# Patient Record
Sex: Female | Born: 1970 | Race: Black or African American | Hispanic: No | Marital: Married | Smoking: Never smoker
Health system: Southern US, Community
[De-identification: ages and names within clinical notes are randomized; demographics above are authoritative.]

## PROBLEM LIST (undated history)

## (undated) DIAGNOSIS — Z87442 Personal history of urinary calculi: Secondary | ICD-10-CM

## (undated) DIAGNOSIS — M51369 Other intervertebral disc degeneration, lumbar region without mention of lumbar back pain or lower extremity pain: Secondary | ICD-10-CM

## (undated) DIAGNOSIS — K589 Irritable bowel syndrome without diarrhea: Secondary | ICD-10-CM

## (undated) DIAGNOSIS — R51 Headache: Secondary | ICD-10-CM

## (undated) DIAGNOSIS — G8929 Other chronic pain: Secondary | ICD-10-CM

## (undated) DIAGNOSIS — R109 Unspecified abdominal pain: Secondary | ICD-10-CM

## (undated) DIAGNOSIS — K529 Noninfective gastroenteritis and colitis, unspecified: Secondary | ICD-10-CM

## (undated) DIAGNOSIS — J45909 Unspecified asthma, uncomplicated: Secondary | ICD-10-CM

## (undated) DIAGNOSIS — M5430 Sciatica, unspecified side: Secondary | ICD-10-CM

## (undated) DIAGNOSIS — M549 Dorsalgia, unspecified: Secondary | ICD-10-CM

## (undated) DIAGNOSIS — M5126 Other intervertebral disc displacement, lumbar region: Secondary | ICD-10-CM

## (undated) DIAGNOSIS — M5136 Other intervertebral disc degeneration, lumbar region: Secondary | ICD-10-CM

## (undated) DIAGNOSIS — M25569 Pain in unspecified knee: Secondary | ICD-10-CM

## (undated) DIAGNOSIS — I10 Essential (primary) hypertension: Secondary | ICD-10-CM

## (undated) DIAGNOSIS — R519 Headache, unspecified: Secondary | ICD-10-CM

## (undated) DIAGNOSIS — R110 Nausea: Secondary | ICD-10-CM

## (undated) DIAGNOSIS — K5790 Diverticulosis of intestine, part unspecified, without perforation or abscess without bleeding: Secondary | ICD-10-CM

---

## 2001-09-06 ENCOUNTER — Ambulatory Visit (HOSPITAL_COMMUNITY): Admission: AD | Admit: 2001-09-06 | Discharge: 2001-09-06 | Payer: Self-pay | Admitting: Obstetrics and Gynecology

## 2001-09-07 ENCOUNTER — Ambulatory Visit (HOSPITAL_COMMUNITY): Admission: RE | Admit: 2001-09-07 | Discharge: 2001-09-07 | Payer: Self-pay | Admitting: Obstetrics and Gynecology

## 2001-09-07 ENCOUNTER — Encounter: Payer: Self-pay | Admitting: Obstetrics and Gynecology

## 2002-11-16 HISTORY — PX: CHOLECYSTECTOMY: SHX55

## 2003-02-01 ENCOUNTER — Encounter: Payer: Self-pay | Admitting: Emergency Medicine

## 2003-02-01 ENCOUNTER — Emergency Department (HOSPITAL_COMMUNITY): Admission: EM | Admit: 2003-02-01 | Discharge: 2003-02-01 | Payer: Self-pay | Admitting: Emergency Medicine

## 2005-06-26 ENCOUNTER — Ambulatory Visit (HOSPITAL_COMMUNITY): Admission: RE | Admit: 2005-06-26 | Discharge: 2005-06-26 | Payer: Self-pay | Admitting: Emergency Medicine

## 2005-06-26 ENCOUNTER — Emergency Department (HOSPITAL_COMMUNITY): Admission: EM | Admit: 2005-06-26 | Discharge: 2005-06-26 | Payer: Self-pay | Admitting: Emergency Medicine

## 2005-07-02 ENCOUNTER — Observation Stay (HOSPITAL_COMMUNITY): Admission: EM | Admit: 2005-07-02 | Discharge: 2005-07-05 | Payer: Self-pay | Admitting: *Deleted

## 2005-07-03 ENCOUNTER — Encounter (INDEPENDENT_AMBULATORY_CARE_PROVIDER_SITE_OTHER): Payer: Self-pay | Admitting: General Surgery

## 2010-12-22 ENCOUNTER — Other Ambulatory Visit: Payer: Self-pay | Admitting: Obstetrics & Gynecology

## 2010-12-22 ENCOUNTER — Other Ambulatory Visit (HOSPITAL_COMMUNITY)
Admission: RE | Admit: 2010-12-22 | Discharge: 2010-12-22 | Disposition: A | Discharge status: Home / Self Care | Payer: BC Managed Care – PPO | Source: Ambulatory Visit | Attending: Obstetrics & Gynecology | Admitting: Obstetrics & Gynecology

## 2010-12-22 DIAGNOSIS — Z01419 Encounter for gynecological examination (general) (routine) without abnormal findings: Secondary | ICD-10-CM | POA: Insufficient documentation

## 2011-04-03 NOTE — H&P (Signed)
Rebecca Odom, BRITZ NO.:  [id number]   MEDICAL RECORD NO.:  [id number]          PATIENT TYPE:  INP   LOCATION:  A320                          FACILITY:  [facility]   PHYSICIAN:  Dalia Heading, M.D.  DATE OF BIRTH:  05-02-1971   DATE OF ADMISSION:  DATE OF DISCHARGE:  LH                                HISTORY & PHYSICAL   CHIEF COMPLAINT:  Cholecystitis, cholelithiasis.   HISTORY OF PRESENT ILLNESS:  The patient is a 40 year old white female who  was referred for evaluation and treatment of biliary colic secondary to  cholelithiasis.  She has been having right upper quadrant abdominal pain  with radiation to the right flank, nausea and bloating for several weeks.  She does have fatty food intolerance.  No fever, chills or jaundice had been  noted.   PAST MEDICAL HISTORY:  Includes extrinsic allergies.   PAST SURGICAL HISTORY:  Unremarkable.   CURRENT MEDICATIONS:  1.  Vicodin for pain.  2.  Nasal spray.   ALLERGIES:  No known drug allergies.   REVIEW OF SYSTEMS:  Noncontributory.   PHYSICAL EXAMINATION:  GENERAL APPEARANCE:  The patient is a well-developed,  well-nourished black female in no acute distress.  VITAL SIGNS:  She is afebrile, and the vital signs are stable.  HEENT:  Reveals no scleral icterus.  LUNGS:  Clear to auscultation with equal breath sounds bilaterally.  HEART:  Reveals a regular rate and rhythm without S3, S4 or murmurs.  ABDOMEN:  Soft and nondistended.  She is tender in the right upper quadrant  to palpation.  No hepatosplenomegaly, masses or hernias are identified.   An ultrasound of the gallbladder reveals cholelithiasis with a normal common  bile duct.   IMPRESSION:  1.  Cholecystitis.  2.  Cholelithiasis.   PLAN:  The patient is scheduled for a laparoscopic cholecystectomy on July 06, 2005.  The risks and benefits of the procedure including bleeding,  infection, hepatobiliary injury and the possibility of an open  procedure  were fully explained to the patient, who gave informed consent.      Dalia Heading, M.D.  Electronically Signed     MAJ/MEDQ  D:  06/30/2005  T:  06/30/2005  Job:  [id number]   cc:   Dalia Heading, M.D.  [address], [place]  [place] [address]  Fax: [phone]

## 2011-04-03 NOTE — Op Note (Signed)
Rebecca Odom, Rebecca Odom NO.:  [id number]   MEDICAL RECORD NO.:  [id number]          PATIENT TYPE:  OBV   LOCATION:  A320                          FACILITY:  [facility]   PHYSICIAN:  Dalia Heading, M.D.  DATE OF BIRTH:  1971/01/18   DATE OF PROCEDURE:  07/03/2005  DATE OF DISCHARGE:                                 OPERATIVE REPORT   PREOPERATIVE DIAGNOSIS:  Cholecystitis, cholelithiasis.   POSTOPERATIVE DIAGNOSIS:  Cholecystitis, cholelithiasis.   PROCEDURE:  Laparoscopic cholecystectomy.   SURGEON:  Dr. Franky Macho.   ASSISTANT:  Dr. Arna Snipe.   ANESTHESIA:  General endotracheal.   INDICATIONS:  The patient is a 40 year old black female who presents with  cholecystitis secondary to cholelithiasis. Risks and benefits of procedure  including bleeding, infection, hepatobiliary injury, and the possibility of  an open procedure were fully explained to the patient, who gave informed  consent.   PROCEDURE NOTE:  The patient was placed in supine position. After induction  of general endotracheal anesthesia, the abdomen was prepped and draped using  the usual sterile technique with Betadine. Surgical site confirmation was  performed.   A supraumbilical incision was made down to the fascia. Veress needle was  introduced into the abdominal cavity and confirmation of placement was done  using the saline drop test. The abdomen was then insufflated to 16 mmHg  pressure. An  11-mm trocar was introduced into the abdominal cavity under  direct visualization without difficulty. The patient was placed in reverse  Trendelenburg position. An additional 11-mm trocar was placed in the  epigastric region, and 5-mm trocars were placed in the right upper quadrant  and right flank regions. Liver was inspected and noted to normal limits.  Gallbladder was attracted superiorly and laterally. Dissection was begun  around the infundibulum of the gallbladder. The cystic ducts first  identified. Its juncture to the infundibulum fully identified. Endoclips  were placed proximally and distally on the cystic duct and cystic duct was  divided. This was likewise done to the cystic artery. The gallbladder then  freed away from the gallbladder fossa using Bovie electrocautery. The  gallbladder was delivered through the epigastric trocar site using an  EndoCatch bag. Gallbladder fossa was inspected. No abnormal bleeding or bile  leakage was noted. Surgicel was placed in the gallbladder fossa. All fluid  and air were then evacuated from the abdominal cavity prior to removal the  trocars.   All wounds were irrigated with normal saline. All wounds were injected with  0.5% Sensorcaine. The supraumbilical fascia was inspected, and no  significant fascial defect was noted. All the wounds were closed with  staples. Betadine ointment and dry sterile dressings were applied.   All tape and needle counts were correct at the end of the procedure. The  patient was extubated in the operating room and went back to recovery room  awake in stable condition.   COMPLICATIONS:  None.   SPECIMEN:  Gallbladder with stone.   BLOOD LOSS:  Minimal.      Dalia Heading, M.D.  Electronically Signed  MAJ/MEDQ  D:  07/03/2005  T:  07/03/2005  Job:  [id number]   cc:   [facility]

## 2011-04-03 NOTE — Discharge Summary (Signed)
NAMEBALINDA, Rebecca Odom NO.:  [id number]   MEDICAL RECORD NO.:  [id number]          PATIENT TYPE:  OBV   LOCATION:  A320                          FACILITY:  [facility]   PHYSICIAN:  Dalia Heading, M.D.  DATE OF BIRTH:  11/02/1971   DATE OF ADMISSION:  07/02/2005  DATE OF DISCHARGE:  08/20/2006LH                                 DISCHARGE SUMMARY   HOSPITAL COURSE SUMMARY:  Patient is a 40 year old black female with a known  history of cholecystitis and cholelithiasis who presented to the emergency  room on July 02, 2005 with worsening right upper quadrant abdominal pain,  nausea, vomiting. She was noted to have an elevated white blood cell count  at 13.4. She was admitted to the hospital for control of her nausea and  vomiting as well as to start antibiotic therapy. She subsequently underwent  laparoscopic cholecystectomy on July 04, 2005. She tolerated the procedure  well. Postoperative course was remarkable for a fever on the first  postoperative day. Her white blood cell count was noted to be 17.1 with  elevated neutrophil count. Her hematocrit was stable. She was continued on  Zosyn. Her diet was advanced slowly without difficulty.   Patient's fever did resolve. The following day, her white blood cell count  was noted to be stable at around 18. She does feel better and requests to be  discharged.   She will be discharged on Levaquin. Of note was that her liver enzyme tests  were within normal limits on postoperative day #2. The patient is being  discharged home on postoperative day #2 in good condition.   DISCHARGE INSTRUCTIONS:  The patient is to follow up with Dr. Franky Macho  on July 09, 2005. Discharge medications include Vicodin one to two tablets  p.o. q.4 h. p.r.n. pain and Levaquin 500 mg p.o. daily x1 week.   PRINCIPAL DIAGNOSES:  1.  Cholecystitis, cholelithiasis.  2.  History of sinusitis.   PRINCIPAL PROCEDURE:  Laparoscopic cholecystectomy  on July 04, 2005.      Dalia Heading, M.D.  Electronically Signed     MAJ/MEDQ  D:  07/05/2005  T:  07/05/2005  Job:  [id number]   cc:   [facility]

## 2011-04-17 HISTORY — PX: ENDOMETRIAL ABLATION: SHX621

## 2011-05-06 ENCOUNTER — Encounter (HOSPITAL_COMMUNITY)
Admission: RE | Admit: 2011-05-06 | Discharge: 2011-05-06 | Disposition: A | Discharge status: Home / Self Care | Payer: BC Managed Care – PPO | Source: Ambulatory Visit | Attending: Obstetrics & Gynecology | Admitting: Obstetrics & Gynecology

## 2011-05-06 LAB — COMPREHENSIVE METABOLIC PANEL
ALT: 14 U/L (ref 0–35)
AST: 15 U/L (ref 0–37)
Albumin: 3.7 g/dL (ref 3.5–5.2)
Alkaline Phosphatase: 88 U/L (ref 39–117)
BUN: 13 mg/dL (ref 6–23)
Chloride: 104 mEq/L (ref 96–112)
Potassium: 4.2 mEq/L (ref 3.5–5.1)
Sodium: 139 mEq/L (ref 135–145)
Total Bilirubin: 0.4 mg/dL (ref 0.3–1.2)
Total Protein: 7.4 g/dL (ref 6.0–8.3)

## 2011-05-06 LAB — URINALYSIS, ROUTINE W REFLEX MICROSCOPIC
Bilirubin Urine: NEGATIVE
Ketones, ur: NEGATIVE mg/dL
Specific Gravity, Urine: 1.025 (ref 1.005–1.030)
pH: 6 (ref 5.0–8.0)

## 2011-05-06 LAB — URINE MICROSCOPIC-ADD ON

## 2011-05-06 LAB — HCG, QUANTITATIVE, PREGNANCY: hCG, Beta Chain, Quant, S: 1 m[IU]/mL (ref <5)

## 2011-05-06 LAB — CBC
HCT: 34.5 % — ABNORMAL LOW (ref 36.0–46.0)
Hemoglobin: 11.1 g/dL — ABNORMAL LOW (ref 12.0–15.0)
MCH: 25.9 pg — ABNORMAL LOW (ref 26.0–34.0)
MCHC: 32.2 g/dL (ref 30.0–36.0)
MCV: 80.6 fL (ref 78.0–100.0)
Platelets: 399 10*3/uL (ref 150–400)
RBC: 4.28 MIL/uL (ref 3.87–5.11)
RDW: 14.6 % (ref 11.5–15.5)
WBC: 11.9 10*3/uL — ABNORMAL HIGH (ref 4.0–10.5)

## 2011-05-13 ENCOUNTER — Ambulatory Visit (HOSPITAL_COMMUNITY)
Admission: RE | Admit: 2011-05-13 | Discharge: 2011-05-13 | Disposition: A | Discharge status: Home / Self Care | Payer: BC Managed Care – PPO | Source: Ambulatory Visit | Attending: Obstetrics & Gynecology | Admitting: Obstetrics & Gynecology

## 2011-05-13 ENCOUNTER — Other Ambulatory Visit: Payer: Self-pay | Admitting: Obstetrics & Gynecology

## 2011-05-13 DIAGNOSIS — Z01812 Encounter for preprocedural laboratory examination: Secondary | ICD-10-CM | POA: Insufficient documentation

## 2011-05-13 DIAGNOSIS — N920 Excessive and frequent menstruation with regular cycle: Secondary | ICD-10-CM | POA: Insufficient documentation

## 2011-05-13 DIAGNOSIS — N946 Dysmenorrhea, unspecified: Secondary | ICD-10-CM | POA: Insufficient documentation

## 2011-05-19 NOTE — Op Note (Signed)
  NAMESHACARRA, CHOE NO.:  [id number]  MEDICAL RECORD NO.:  [id number]  LOCATION:  DAYP                          FACILITY:  [facility]  PHYSICIAN:  Lazaro Arms, M.D.   DATE OF BIRTH:  1970-12-20  DATE OF PROCEDURE:  05/13/2011 DATE OF DISCHARGE:                              OPERATIVE REPORT   PREOPERATIVE DIAGNOSES: 1. Menometrorrhagia. 2. Dysmenorrhea.  POSTOPERATIVE DIAGNOSES: 1. Menometrorrhagia. 2. Dysmenorrhea.  PROCEDURE:  Hysteroscopy, dilatation and curettage, and endometrial ablation.  SURGEON:  Lazaro Arms, MD  ANESTHESIA:  General endotracheal.  FINDINGS:  The patient had normal endometrium.  No polyps, fibroids, or other abnormalities.  DESCRIPTION OF OPERATION:  The patient was taken to the operating room and placed in a supine position where she underwent general endotracheal anesthesia, placed in the dorsal lithotomy position, prepped and draped in the usual sterile fashion.  Graves speculum was placed.  The cervix was grasped with a single-tooth tenaculum and dilated serially to allow passage of the hysteroscope.  A diagnostic hysteroscopy was performed and found to be normal.  A vigorous uterine curettage was performed and good uterine cry was obtained in all areas.  A ThermaChoice III endometrial ablation balloon was used, 21 mL of D5W was required to maintain a pressure between 190 and 200 mmHg throughout the procedure. Total therapy time was 9 minutes and 49 seconds.  All of the equipment worked properly throughout the procedure.  All the fluid was returned at the end of the procedure.  The patient tolerated the procedure well. She was awakened from anesthesia and taken to recovery room in good stable condition.  She received Ancef and Toradol preoperatively prophylactically.    Lazaro Arms, M.D.    Loraine Maple  D:  05/13/2011  T:  05/14/2011  Job:  [id number]  Electronically Signed by Duane Lope M.D. on 05/19/2011  10:30:12 AM

## 2011-12-06 ENCOUNTER — Encounter (HOSPITAL_COMMUNITY): Payer: Self-pay | Admitting: *Deleted

## 2011-12-06 ENCOUNTER — Emergency Department (HOSPITAL_COMMUNITY)
Admission: EM | Admit: 2011-12-06 | Discharge: 2011-12-06 | Disposition: A | Discharge status: Home / Self Care | Payer: BC Managed Care – PPO | Attending: Emergency Medicine | Admitting: Emergency Medicine

## 2011-12-06 ENCOUNTER — Emergency Department (HOSPITAL_COMMUNITY): Payer: BC Managed Care – PPO

## 2011-12-06 DIAGNOSIS — Z9851 Tubal ligation status: Secondary | ICD-10-CM | POA: Insufficient documentation

## 2011-12-06 DIAGNOSIS — N201 Calculus of ureter: Secondary | ICD-10-CM | POA: Insufficient documentation

## 2011-12-06 LAB — URINALYSIS, ROUTINE W REFLEX MICROSCOPIC
Glucose, UA: NEGATIVE mg/dL
Leukocytes, UA: NEGATIVE
Protein, ur: NEGATIVE mg/dL
Specific Gravity, Urine: 1.025 (ref 1.005–1.030)

## 2011-12-06 LAB — URINE MICROSCOPIC-ADD ON

## 2011-12-06 LAB — CBC
Hemoglobin: 12 g/dL (ref 12.0–15.0)
MCH: 27 pg (ref 26.0–34.0)
MCHC: 32.5 g/dL (ref 30.0–36.0)
MCV: 82.9 fL (ref 78.0–100.0)
Platelets: 315 10*3/uL (ref 150–400)
RBC: 4.45 MIL/uL (ref 3.87–5.11)

## 2011-12-06 LAB — DIFFERENTIAL
Eosinophils Absolute: 0.4 10*3/uL (ref 0.0–0.7)
Eosinophils Relative: 3 % (ref 0–5)
Lymphs Abs: 5.4 10*3/uL — ABNORMAL HIGH (ref 0.7–4.0)
Monocytes Relative: 6 % (ref 3–12)

## 2011-12-06 LAB — COMPREHENSIVE METABOLIC PANEL
BUN: 12 mg/dL (ref 6–23)
Calcium: 9.7 mg/dL (ref 8.4–10.5)
GFR calc Af Amer: >90 mL/min (ref >90)
Glucose, Bld: 81 mg/dL (ref 70–99)
Total Protein: 8 g/dL (ref 6.0–8.3)

## 2011-12-06 LAB — PREGNANCY, URINE: Preg Test, Ur: NEGATIVE

## 2011-12-06 MED ORDER — TAMSULOSIN HCL 0.4 MG PO CAPS: 0.4000 mg | ORAL_CAPSULE | Freq: Every day | ORAL | Status: DC

## 2011-12-06 MED ORDER — SODIUM CHLORIDE 0.9 % IV BOLUS (SEPSIS)
1000.0000 mL | Freq: Once | INTRAVENOUS | Status: AC
  Administered 2011-12-06: 1000 mL via INTRAVENOUS

## 2011-12-06 MED ORDER — MORPHINE SULFATE 4 MG/ML IJ SOLN
4.0000 mg | Freq: Once | INTRAMUSCULAR | Status: AC
  Administered 2011-12-06: 4 mg via INTRAVENOUS
  Filled 2011-12-06: qty 1

## 2011-12-06 MED ORDER — KETOROLAC TROMETHAMINE 30 MG/ML IJ SOLN
30.0000 mg | Freq: Once | INTRAMUSCULAR | Status: AC
  Administered 2011-12-06: 30 mg via INTRAVENOUS
  Filled 2011-12-06: qty 1

## 2011-12-06 MED ORDER — SODIUM CHLORIDE 0.9 % IV SOLN
INTRAVENOUS | Status: DC
  Administered 2011-12-06: 16:00:00 via INTRAVENOUS

## 2011-12-06 MED ORDER — PROMETHAZINE HCL 25 MG RE SUPP: 25.0000 mg | Freq: Four times a day (QID) | RECTAL | Status: DC | PRN

## 2011-12-06 MED ORDER — OXYCODONE-ACETAMINOPHEN 5-325 MG PO TABS: ORAL_TABLET | ORAL | Status: DC

## 2011-12-06 MED ORDER — ONDANSETRON HCL 4 MG/2ML IJ SOLN
4.0000 mg | Freq: Once | INTRAMUSCULAR | Status: AC
  Administered 2011-12-06: 4 mg via INTRAVENOUS
  Filled 2011-12-06: qty 2

## 2011-12-06 MED ORDER — KETOROLAC TROMETHAMINE 10 MG PO TABS: ORAL_TABLET | ORAL | Status: DC

## 2011-12-06 NOTE — ED Provider Notes (Signed)
Scribed for Ward Givens, MD, the patient was seen in room APA12/APA12 . This chart was scribed by Ellie Lunch.   CSN: [id number]  Arrival date & time 12/06/11  1109   First MD Initiated Contact with Patient 12/06/11 1210      Chief Complaint  Patient presents with  . Flank Pain    (Consider location/radiation/quality/duration/timing/severity/associated sxs/prior treatment) HPI Rebecca Odom is a 41 y.o. female who presents to the Emergency Department complaining of 1 day of left flank pain. Pt treated pain yesterday with antacid and aleve with mild improvement. Pain began to worsen last night and became severe at 9:30 am this morning. Change in positions worsens pain. Pt reports some associated nausea, diarrhea x 5 yesterday, and  Urinary urgency. Denies vomiting, fever hematuria, and frequency or dysuria. Pt had "stomach flu" 3 weeks ago with n/v/d, fever, chills  and reports having a similar pain. Husband had same about 3 weeks ago.  PCP [facility] Dr. Dixie Dials  History reviewed. No pertinent past medical history.  Past Surgical History  Procedure Date  . Tubal ligation     History reviewed. No pertinent family history.  History  Substance Use Topics  . Smoking status: Never Smoker   . Smokeless tobacco: Not on file  . Alcohol Use: Yes  Pt works in Development worker, community in healthcare setting. Lives with spouse  Review of Systems  Gastrointestinal: Positive for nausea and diarrhea. Negative for vomiting.  Genitourinary: Positive for urgency and flank pain. Negative for dysuria, frequency, hematuria and difficulty urinating.  All other systems reviewed and are negative.    Allergies  Vicodin  Home Medications  No current outpatient prescriptions on file.  BP 131/53  Pulse 95  Temp(Src) 97.9 F (36.6 C) (Oral)  Resp 18  Ht 5' 3.5" (1.613 m)  Wt 240 lb (108.863 kg)  BMI 41.85 kg/m2  SpO2 100%  Vital signs normal    Physical Exam  Nursing note and  vitals reviewed. Constitutional: She is oriented to person, place, and time. She appears well-developed and well-nourished.  Non-toxic appearance. She does not appear ill. No distress.  HENT:  Head: Normocephalic and atraumatic.  Right Ear: External ear normal.  Left Ear: External ear normal.  Nose: Nose normal. No mucosal edema or rhinorrhea.  Mouth/Throat: Oropharynx is clear and moist and mucous membranes are normal. No dental abscesses or uvula swelling.  Eyes: Conjunctivae and EOM are normal. Pupils are equal, round, and reactive to light.  Neck: Normal range of motion and full passive range of motion without pain. Neck supple.  Cardiovascular: Normal rate, regular rhythm and normal heart sounds.  Exam reveals no gallop and no friction rub.   No murmur heard. Pulmonary/Chest: Effort normal and breath sounds normal. No respiratory distress. She has no wheezes. She has no rhonchi. She has no rales. She exhibits no tenderness and no crepitus.  Abdominal: Soft. Normal appearance and bowel sounds are normal. She exhibits no distension. There is tenderness. There is no rebound and no guarding.       Patient is tender to palpation in her left lower abdomen without guarding or rebound.  Genitourinary:       Patient has marked pain in her left flank to percussion, no pain on her right  Musculoskeletal: Normal range of motion. She exhibits no edema and no tenderness.       Moves all extremities well.   Neurological: She is alert and oriented to person, place, and time. She  has normal strength. No cranial nerve deficit.  Skin: Skin is warm, dry and intact. No rash noted. No erythema. No pallor.  Psychiatric: She has a normal mood and affect. Her speech is normal and behavior is normal. Her mood appears not anxious.    ED Course  Procedures (including critical care time) DIAGNOSTIC STUDIES: Oxygen Saturation is 100% on room air, normal by my interpretation.    COORDINATION OF CARE:  Results  for orders placed during the hospital encounter of 12/06/11  URINALYSIS, ROUTINE W REFLEX MICROSCOPIC      Component Value Range   Color, Urine YELLOW  YELLOW    APPearance CLOUDY (*) CLEAR    Specific Gravity, Urine 1.025  1.005 - 1.030    pH 5.5  5.0 - 8.0    Glucose, UA NEGATIVE  NEGATIVE (mg/dL)   Hgb urine dipstick LARGE (*) NEGATIVE    Bilirubin Urine NEGATIVE  NEGATIVE    Ketones, ur NEGATIVE  NEGATIVE (mg/dL)   Protein, ur NEGATIVE  NEGATIVE (mg/dL)   Urobilinogen, UA 0.2  0.0 - 1.0 (mg/dL)   Nitrite NEGATIVE  NEGATIVE    Leukocytes, UA NEGATIVE  NEGATIVE   PREGNANCY, URINE      Component Value Range   Preg Test, Ur NEGATIVE    URINE MICROSCOPIC-ADD ON      Component Value Range   Squamous Epithelial / LPF FEW (*) RARE    WBC, UA 0-2  <3 (WBC/hpf)   RBC / HPF 21-50  <3 (RBC/hpf)   Bacteria, UA FEW (*) RARE    Urine-Other MANY YEAST    CBC      Component Value Range   WBC 14.1 (*) 4.0 - 10.5 (K/uL)   RBC 4.45  3.87 - 5.11 (MIL/uL)   Hemoglobin 12.0  12.0 - 15.0 (g/dL)   HCT 16.1  09.6 - 04.5 (%)   MCV 82.9  78.0 - 100.0 (fL)   MCH 27.0  26.0 - 34.0 (pg)   MCHC 32.5  30.0 - 36.0 (g/dL)   RDW 40.9  81.1 - 91.4 (%)   Platelets 315  150 - 400 (K/uL)  DIFFERENTIAL      Component Value Range   Neutrophils Relative 53  43 - 77 (%)   Neutro Abs 7.4  1.7 - 7.7 (K/uL)   Lymphocytes Relative 39  12 - 46 (%)   Lymphs Abs 5.4 (*) 0.7 - 4.0 (K/uL)   Monocytes Relative 6  3 - 12 (%)   Monocytes Absolute 0.9  0.1 - 1.0 (K/uL)   Eosinophils Relative 3  0 - 5 (%)   Eosinophils Absolute 0.4  0.0 - 0.7 (K/uL)   Basophils Relative 0  0 - 1 (%)   Basophils Absolute 0.0  0.0 - 0.1 (K/uL)  COMPREHENSIVE METABOLIC PANEL      Component Value Range   Sodium 136  135 - 145 (mEq/L)   Potassium 4.2  3.5 - 5.1 (mEq/L)   Chloride 103  96 - 112 (mEq/L)   CO2 27  19 - 32 (mEq/L)   Glucose, Bld 81  70 - 99 (mg/dL)   BUN 12  6 - 23 (mg/dL)   Creatinine, Ser 7.82  0.50 - 1.10 (mg/dL)    Calcium 9.7  8.4 - 10.5 (mg/dL)   Total Protein 8.0  6.0 - 8.3 (g/dL)   Albumin 3.5  3.5 - 5.2 (g/dL)   AST 11  0 - 37 (U/L)   ALT 11  0 - 35 (U/L)  Alkaline Phosphatase 92  39 - 117 (U/L)   Total Bilirubin 0.2 (*) 0.3 - 1.2 (mg/dL)   GFR calc non Af Amer >90  >90 (mL/min)   GFR calc Af Amer >90  >90 (mL/min)    Laboratory interpretation all normal except mild hematuria, leukocytosis  Ct Abdomen Pelvis Wo Contrast  12/06/2011  *RADIOLOGY REPORT*  Clinical Data: Left lower quadrant pain, hematuria  CT ABDOMEN AND PELVIS WITHOUT CONTRAST  Technique:  Multidetector CT imaging of the abdomen and pelvis was performed following the standard protocol without intravenous contrast.  Comparison: None.  Findings: Lung bases are clear.  Possible hepatic steatosis.  Spleen, pancreas, and adrenal glands are within normal limits.  Status post cholecystectomy.  No intrahepatic or extrahepatic ductal dilatation.  Kidneys are within normal limits.  No renal calculi or hydronephrosis.  No evidence of bowel obstruction.  Normal appendix.  Colonic diverticulosis, without associated inflammatory changes.  No evidence of abdominal aortic aneurysm.  No abdominopelvic ascites.  No suspicious abdominopelvic lymphadenopathy.  Uterus and right ovary are unremarkable.  4.0 cm left ovarian cyst/follicle.  3 mm proximal left ureteral calculus (series 2/image 36).  No bladder calculi.  Calcifications in the left pelvis are lateral to the ureter and reflect phleboliths (series 2/image 74).  Mild degenerative changes of the visualized thoracolumbar spine.  IMPRESSION: 3 mm proximal left ureteral calculus.  No hydronephrosis.  Original Report Authenticated By: Charline Bills, M.D.    ED MEDICATIONS Medications  0.9 %  sodium chloride infusion   sodium chloride 0.9 % bolus 1,000 mL   morphine 4 MG/ML injection 4 mg  ondansetron (ZOFRAN) injection 4 mg    3:21 PM Pt recheck. Discussed with Pt small kidney stone present in  ureter. Stone will likely pass on its own. Plan to discharge with referral to urologist. Pt says her pain is still present but is improved. Pt is ready for discharge.   Diagnoses that have been ruled out:  None  Diagnoses that are still under consideration:  None  Final diagnoses:  Ureteral stone   New Prescriptions   KETOROLAC (TORADOL) 10 MG TABLET    Take 1 po QID until gone   OXYCODONE-ACETAMINOPHEN (PERCOCET) 5-325 MG PER TABLET    Take 1 or 2 po every 6 hrs for pain   PROMETHAZINE (PHENERGAN) 25 MG SUPPOSITORY    Place 1 suppository (25 mg total) rectally every 6 (six) hours as needed for nausea.   TAMSULOSIN HCL (FLOMAX) 0.4 MG CAPS    Take 1 capsule (0.4 mg total) by mouth daily.   Plan discharge with urology referral.   MDM   I personally performed the services described in this documentation, which was scribed in my presence. The recorded information has been reviewed and considered. Devoria Albe, MD, Armando Gang        Ward Givens, MD 12/06/11 [phone]

## 2011-12-06 NOTE — ED Notes (Signed)
Pt c/o left flank pain and nausea this am. Also c/o urinary urgency.

## 2011-12-06 NOTE — Discharge Instructions (Signed)
Drink plenty of fluids. Take the medications as prescribed. Return to the ED if you get fever, vomiting, fever or the pain gets worse. You should cal the urologist on call, Dr Jerre Simon if you haven't passed the stone in the next 3-4 days.

## 2011-12-06 NOTE — ED Notes (Signed)
Denies chills or fever, denies body aches

## 2011-12-06 NOTE — ED Notes (Signed)
Recurrent diarrhea yesterday but denies any today, left flank pain that comes and goes since Jan. 2, states that she had a "stomach virus" on Jan. 2 this year, pain radiates around to front to lower abdomen, denies burning on urination, nausea but denies vomiting, + poor PO intake

## 2012-01-12 ENCOUNTER — Ambulatory Visit: Payer: BC Managed Care – PPO | Admitting: Orthopedic Surgery

## 2012-01-25 ENCOUNTER — Encounter: Payer: Self-pay | Admitting: Orthopedic Surgery

## 2012-01-25 ENCOUNTER — Ambulatory Visit (INDEPENDENT_AMBULATORY_CARE_PROVIDER_SITE_OTHER): Payer: BC Managed Care – PPO | Admitting: Orthopedic Surgery

## 2012-01-25 VITALS — BP 124/60 | Ht 63.25 in | Wt 240.0 lb

## 2012-01-25 DIAGNOSIS — M5430 Sciatica, unspecified side: Secondary | ICD-10-CM | POA: Insufficient documentation

## 2012-01-25 MED ORDER — PREDNISONE 10 MG PO KIT: 10.0000 mg | PACK | ORAL | Status: DC

## 2012-01-25 MED ORDER — GABAPENTIN 100 MG PO CAPS: 100.0000 mg | ORAL_CAPSULE | Freq: Every day | ORAL | Status: DC

## 2012-01-25 NOTE — Progress Notes (Signed)
  Subjective:    Rebecca Odom is a 41 y.o. female who presents with a three-week history of numbness and tingling with pain at the lateral border of her lower LEFT leg associated with some knee pain and recently lumbar pain.  The pain is sharp throbbing stabbing at times.  She rates her pain 8 out of 10.  Timing constant.  No associated injury.  She did try to take some Aleve chief use some rubbing alcohol which did seem to help however her symptoms seem to be worsening  She does report under review of systems that she has fatigue and she snores.  She bruises easily she has some seasonal ALLERGIES.  History reviewed. No pertinent past medical history.  Past Surgical History  Procedure Date  . Tubal ligation   . Cholecystectomy    BP 124/60  Ht 5' 3.25" (1.607 m)  Wt 240 lb (108.863 kg)  BMI 42.18 kg/m2  Vital signs are stable as recorded  General appearance is normal  The patient is alert and oriented x3  The patient's mood and affect are normal  Gait assessment: she is ambulatory but she is favoring the LEFT lower extremity and she is limping The cardiovascular exam reveals normal pulses and temperature without edema swelling.  The lymphatic system is negative for palpable lymph nodes  The sensory exam is normal.  There are no pathologic reflexes.  Balance is normal.   Exam of the lumbar spine shows tenderness in the gluteal area but no tenderness and the spinal interspaces or the spine Inspection lower extremities show no gross abnormalities and leg lengths are equal Range of motion hip and knee normal bilaterally Stability knee and hips are stable Strength strength in both lower extremities is normal Skin skin along the lumbar area and lower extremities normal  Imaging we ordered a lumbar spine x-ray and that does show that she does have questionable spondylo-lysis at L5-S1 with mild facet joint arthritis at L4 and 5 and L5-S1.  There is shot slightly symmetric  contour of the lumbar spine.  Impression sciatica  Plan nonoperative treatment with medication and supportive measures such as heat.  We will use a Dosepak as well as Neurontin.

## 2012-01-25 NOTE — Patient Instructions (Signed)
Take meds as directed   Apply heat as needed Sciatica Sciatica is a weakness and/or changes in sensation (tingling, jolts, hot and cold, numbness) along the path the sciatic nerve travels. Irritation or damage to lumbar nerve roots is often also referred to as lumbar radiculopathy.   Lumbar radiculopathy (Sciatica) is the most common form of this problem. Radiculopathy can occur in any of the nerves coming out of the spinal cord. The problems caused depend on which nerves are involved. The sciatic nerve is the large nerve supplying the branches of nerves going from the hip to the toes. It often causes a numbness or weakness in the skin and/or muscles that the sciatic nerve serves. It also may cause symptoms (problems) of pain, burning, tingling, or electric shock-like feelings in the path of this nerve. This usually comes from injury to the fibers that make up the sciatic nerve. Some of these symptoms are low back pain and/or unpleasant feelings in the following areas:  From the mid-buttock down the back of the leg to the back of the knee.   And/or the outside of the calf and top of the foot.   And/or behind the inner ankle to the sole of the foot.  CAUSES    Herniated or slipped disc. Discs are the little cushions between the bones in the back.   Pressure by the piriformis muscle in the buttock on the sciatic nerve (Piriformis Syndrome).   Misalignment of the bones in the lower back and buttocks (Sacroiliac Joint Derangement).   Narrowing of the spinal canal that puts pressure on or pinches the fibers that make up the sciatic nerve.   A slipped vertebra that is out of line with those above or beneath it.   Abnormality of the nervous system itself so that nerve fibers do not transmit signals properly, especially to feet and calves (neuropathy).   Tumor (this is rare).  Your caregiver can usually determine the cause of your sciatica and begin the treatment most likely to help you. TREATMENT    Taking over-the-counter painkillers, physical therapy, rest, exercise, spinal manipulation, and injections of anesthetics and/or steroids may be used. Surgery, acupuncture, and Yoga can also be effective. Mind over matter techniques, mental imagery, and changing factors such as your bed, chair, desk height, posture, and activities are other treatments that may be helpful. You and your caregiver can help determine what is best for you. With proper diagnosis, the cause of most sciatica can be identified and removed. Communication and cooperation between your caregiver and you is essential. If you are not successful immediately, do not be discouraged. With time, a proper treatment can be found that will make you comfortable. HOME CARE INSTRUCTIONS    If the pain is coming from a problem in the back, applying ice to that area for 15 to 20 minutes, 3 to 4 times per day while awake, may be helpful. Put the ice in a plastic bag. Place a towel between the bag of ice and your skin.   You may exercise or perform your usual activities if these do not aggravate your pain, or as suggested by your caregiver.   Only take over-the-counter or prescription medicines for pain, discomfort, or fever as directed by your caregiver.   If your caregiver has given you a follow-up appointment, it is very important to keep that appointment. Not keeping the appointment could result in a chronic or permanent injury, pain, and disability. If there is any problem keeping the appointment,  you must call back to this facility for assistance.  SEEK IMMEDIATE MEDICAL CARE IF:    You experience loss of control of bowel or bladder.   You have increasing weakness in the trunk, buttocks, or legs.   There is numbness in any areas from the hip down to the toes.   You have difficulty walking or keeping your balance.   You have any of the above, with fever or forceful vomiting.  Document Released: 10/27/2001 Document Revised: 10/22/2011  Document Reviewed: 06/15/2008 [facility] Patient Information 2012 [place], [place].

## 2012-03-23 ENCOUNTER — Ambulatory Visit (INDEPENDENT_AMBULATORY_CARE_PROVIDER_SITE_OTHER): Payer: BC Managed Care – PPO | Admitting: Orthopedic Surgery

## 2012-03-23 ENCOUNTER — Encounter: Payer: Self-pay | Admitting: Orthopedic Surgery

## 2012-03-23 VITALS — BP 110/60 | Ht 63.0 in | Wt 240.0 lb

## 2012-03-23 DIAGNOSIS — M5430 Sciatica, unspecified side: Secondary | ICD-10-CM

## 2012-03-23 DIAGNOSIS — M5126 Other intervertebral disc displacement, lumbar region: Secondary | ICD-10-CM

## 2012-03-23 MED ORDER — HYDROCODONE-ACETAMINOPHEN 7.5-325 MG PO TABS: 1.0000 | ORAL_TABLET | ORAL | Status: AC | PRN

## 2012-03-23 NOTE — Progress Notes (Signed)
Subjective:     Patient ID: Rebecca Odom, female   DOB: [birth year], 41 y.o.   MRN: [id number] Chief Complaint  Patient presents with  . Follow-up    Still having leg pain.    HPI The patient is still having pain in her left lower extremity it has actually worsened after being treated with prednisone and gabapentin. She has increased pain paresthesias and numbness and tingling in the lower extremity which radiates from her hip down into her foot she has weakness in collapsing of the leg and now has back pain.   Review of Systems Red flags are negative    Objective:   Physical Exam Physical Exam(6) GENERAL: normal development   CDV: pulses are normal   Skin: normal  Psychiatric: awake, alert and oriented  Neuro: normal sensation  MSK lumbar spine tenderness L5-S1 interspace and left buttock area 1 positive straight leg raise at 45, positive Lasegue's. Negative contralateral straight leg raise. 2 muscle strength and tone normal 3 reflexes equal 4 legs stability knees and hips normal   Assessment:     Herniated disc lumbar spine    Plan:     MRI, continue gabapentin take Norco for pain

## 2012-03-23 NOTE — Patient Instructions (Addendum)
You have been scheduled for an MRI scan.  Your insurance company requires advocate precertification prior to scheduling the MRI.  If her MRI scan is not improved we will let you know and make further treatment recommendations according to your insurance's guidelines.     We will schedule you for an appointment to review the results and make further treatment recommendations  

## 2012-03-31 ENCOUNTER — Telehealth: Payer: Self-pay | Admitting: Radiology

## 2012-03-31 NOTE — Telephone Encounter (Signed)
I called to give the patient her MRI appointment at [facility] on 04-14-12 at 9:30. Patient has [employer], authorization [id number] and it expires on 04-29-12. She has [employer] as her secondary, no precert was needed. Patient will follow up back here for her results.

## 2012-04-06 ENCOUNTER — Ambulatory Visit: Payer: BC Managed Care – PPO | Admitting: Orthopedic Surgery

## 2012-04-14 ENCOUNTER — Ambulatory Visit
Admission: RE | Admit: 2012-04-14 | Discharge: 2012-04-14 | Disposition: A | Discharge status: Home / Self Care | Payer: BC Managed Care – PPO | Source: Ambulatory Visit | Attending: Orthopedic Surgery | Admitting: Orthopedic Surgery

## 2012-04-14 DIAGNOSIS — M5126 Other intervertebral disc displacement, lumbar region: Secondary | ICD-10-CM

## 2012-04-20 ENCOUNTER — Encounter: Payer: Self-pay | Admitting: Orthopedic Surgery

## 2012-04-20 ENCOUNTER — Ambulatory Visit (INDEPENDENT_AMBULATORY_CARE_PROVIDER_SITE_OTHER): Payer: BC Managed Care – PPO | Admitting: Orthopedic Surgery

## 2012-04-20 VITALS — BP 130/70 | Ht 63.0 in | Wt 240.0 lb

## 2012-04-20 DIAGNOSIS — IMO0002 Reserved for concepts with insufficient information to code with codable children: Secondary | ICD-10-CM

## 2012-04-20 DIAGNOSIS — M5430 Sciatica, unspecified side: Secondary | ICD-10-CM

## 2012-04-20 MED ORDER — GABAPENTIN 100 MG PO CAPS: 100.0000 mg | ORAL_CAPSULE | Freq: Every day | ORAL | Status: DC

## 2012-04-20 NOTE — Progress Notes (Signed)
Patient ID: Rebecca Odom, female   DOB: [birth year], 41 y.o.   MRN: [id number] Followup. Chief Complaint  Patient presents with  . Results     review MRI spine   chief complaint left leg pain  History atraumatic onset of left lower extremity radicular-like symptoms unrelieved by medication and therapy  Review of systems red flags are denied. I interpret this image as a protruding disc at the left posterolateral position. There are degenerative changes.  [facility] 2013 Imaging study. MRI. The MRI is reviewed with its corresponding report.  I am in agreement with the report that reads:  L4-5: Bilateral facet joint degenerative changes greater on the  left.  L5-S1: Shallow broad-based caudally extending protrusion left  posterior lateral position with slight indentation upon the left  ventral thecal sac just above the takeoff of the left S1 nerve  root. Endplate reactive changes greater on the left where there is  a left lateral osteophyte without compression of the exiting left  L5 nerve root. Mild facet joint degenerative changes.   My treatment plan is as follows: epidural injection series x 3 @ L5-S1

## 2012-04-20 NOTE — Patient Instructions (Addendum)
Consult Dr Nickola Major:  You have a protruding disc at the L5-S1 level. We are advising that you. Undergo epidural steroid series of injections at L5-S1.  Pain does not improved to a satisfactory level. He will be referred to a neurosurgeon for surgical evaluation. Because I do not perform back surgeries  Epidural Steroid Injection An epidural steroid injection is given to relieve pain in the neck, back, or legs. This procedure involves injecting a steroid and numbing medicine (local anesthetic) into the epidural space. The epidural space is the space between the outer covering of the spinal cord and the vertebra. The epidural steroid injection helps in reducing the pain that is caused by the irritation or swelling of the nerve root. However, it does not cure the underlying problem. The injection may be given for the following conditions:  Changes in the soft, gel-like cushion between two vertebrae (disk) due to wear and tear.   A reduction in the space within the spinal canal.   Slipped or herniated disk.   Low back (lumbar) sprain.   Sciatica. This is shooting pain that radiates down the buttocks and the back of the leg due to compression of the nerve.   Traumatic compression fracture of the vertebra.   Pain that develops after a surgery of the spine.   Pain that arises after an attack of viral infection affecting the nerves (shingles).  LET YOUR CAREGIVER KNOW ABOUT:    Allergies to food or medicine.   Medicines taken, including vitamins, herbs, eyedrops, over-the-counter medicines, and creams.   Use of steroids (by mouth or creams).   Previous problems with anesthetics or numbing medicines.   History of bleeding problems or blood clots.   Previous surgery.   Other health problems, including diabetes and kidney problems.   Possibility of pregnancy, if this applies.      RISKS AND COMPLICATIONS The complications due to the needle insertion are:  Headache.    Bleeding.   Infection.   Allergic reaction to the medicines.   Damage to the nerves.  The complications due to the steroid are:  Weight gain.   Hot flashes.   Mood swings.   Lack of sleep.   Increase in blood sugar levels, especially if you are diabetic.   Retention of water.  The response to this procedure depends on the underlying cause of the pain and its duration. Patients who have long-term (chronic) pain are less likely to benefit from epidural steroids than are patients whose pain comes on strong and suddenly. BEFORE THE PROCEDURE    The caregiver may ask about your symptoms, do a detailed exam, and advise some tests. These tests may include imaging studies. Your caregiver may review the results of your tests and discuss the procedure with you.   Ask your caregiver about changing or stopping your regular medicines. You may be advised to stop taking blood-thinning medicines a few days before the procedure.   You may also be given medicines to reduce your anxiety.  PROCEDURE   You will remain awake during the whole procedure. Although, you may receive medicine to make you sleepy. You will be asked to lie on your stomach. The site of the injection is cleansed. Then, the injection site is numbed using a small amount of medicine that numbs the area (local anesthetic). A hollow needle is directed through your skin into the epidural space with the help of an X-ray. The X-ray helps to ensure that the steroid is delivered closest to  the affected nerve. You may have some minimal discomfort at this time. Once the needle is in the right position, the local anesthetic and the steroid are injected into the epidural space. The needle is then removed. The skin is cleaned and a bandage is applied. The entire procedure takes only a few minutes, although repeated injections may be required (up to 3 to 4 injections over several weeks).   AFTER THE PROCEDURE    You may be monitored for a short  time before you go home.   You may feel weakness or numbness in your arm or leg, which disappears within 1 to 2 hours.   Someone must drive you home or accompany you home if you are taking a taxi.   You may be allowed to eat, drink, and take your regular medicine.   Your pain may improve or worsen right after the procedure.   You may feel the beneficial effect of the steroid a few days later.   You may have soreness at the site of the injection.   If you have only partial relief of the pain, the injection may be repeated once or even twice within 4 to 8 weeks of the initial injection.  Document Released: 02/09/2008 Document Revised: 10/22/2011 Document Reviewed: 03/14/2009 [facility] Patient Information 2012 [place], [place].   OOW 3 DAYS

## 2012-04-22 ENCOUNTER — Other Ambulatory Visit: Payer: Self-pay | Admitting: *Deleted

## 2012-04-22 DIAGNOSIS — M4807 Spinal stenosis, lumbosacral region: Secondary | ICD-10-CM

## 2012-11-13 ENCOUNTER — Encounter (HOSPITAL_COMMUNITY): Payer: Self-pay

## 2012-11-13 ENCOUNTER — Emergency Department (HOSPITAL_COMMUNITY)
Admission: EM | Admit: 2012-11-13 | Discharge: 2012-11-13 | Disposition: A | Discharge status: Home / Self Care | Payer: BC Managed Care – PPO | Attending: Emergency Medicine | Admitting: Emergency Medicine

## 2012-11-13 DIAGNOSIS — R51 Headache: Secondary | ICD-10-CM | POA: Insufficient documentation

## 2012-11-13 DIAGNOSIS — M549 Dorsalgia, unspecified: Secondary | ICD-10-CM | POA: Insufficient documentation

## 2012-11-13 DIAGNOSIS — G8929 Other chronic pain: Secondary | ICD-10-CM | POA: Insufficient documentation

## 2012-11-13 DIAGNOSIS — Z8739 Personal history of other diseases of the musculoskeletal system and connective tissue: Secondary | ICD-10-CM | POA: Insufficient documentation

## 2012-11-13 HISTORY — DX: Other intervertebral disc degeneration, lumbar region without mention of lumbar back pain or lower extremity pain: M51.369

## 2012-11-13 HISTORY — DX: Other intervertebral disc displacement, lumbar region: M51.26

## 2012-11-13 HISTORY — DX: Other intervertebral disc degeneration, lumbar region: M51.36

## 2012-11-13 MED ORDER — KETOROLAC TROMETHAMINE 30 MG/ML IJ SOLN
60.0000 mg | Freq: Once | INTRAMUSCULAR | Status: AC
  Administered 2012-11-13: 60 mg via INTRAMUSCULAR
  Filled 2012-11-13: qty 2

## 2012-11-13 MED ORDER — IBUPROFEN 800 MG PO TABS: ORAL_TABLET | ORAL | Status: DC

## 2012-11-13 NOTE — ED Notes (Signed)
Patient family member asking about wait time. Informed new MD came on duty and will be in to see her as quick as he can.

## 2012-11-13 NOTE — ED Notes (Signed)
Pt reports headache since yesterday, +nausea. No vomiting, has tried multiple otc meds with no relief, thinks may be related to her back injury.

## 2012-11-13 NOTE — ED Provider Notes (Signed)
History  This chart was scribed for Rebecca Lennert, MD by Manuela Schwartz, ED scribe. This patient was seen in room APA06/APA06 and the patient's care was started at 1113.   CSN: [id number]  Arrival date & time 11/13/12  1113   First MD Initiated Contact with Patient 11/13/12 1328      Chief Complaint  Patient presents with  . Headache   Patient is a 41 y.o. female presenting with headaches. The history is provided by the patient. No language interpreter was used.  Headache  This is a new problem. The current episode started 2 days ago. The problem occurs constantly. The problem has been gradually worsening. The headache is associated with nothing. The pain is moderate. The pain does not radiate. Pertinent negatives include no fever, no shortness of breath, no nausea and no vomiting. Treatments tried: Excedrin. The treatment provided no relief.   Rebecca Odom is a 41 y.o. female who presents to the Emergency Department complaining of constant gradually worsening moderate HA for the past few days with associated nausea and no emesis. She states took Excedrin yesterday with mild relief. She thinks maybe her symptoms are related to hx of lumbar buldging disk.   Past Medical History  Diagnosis Date  . Bulging lumbar disc     Past Surgical History  Procedure Date  . Tubal ligation   . Cholecystectomy     Family History  Problem Relation Age of Onset  . Heart disease    . Diabetes      History  Substance Use Topics  . Smoking status: Never Smoker   . Smokeless tobacco: Not on file  . Alcohol Use: Yes     Comment: occ    OB History    Grav Para Term Preterm Abortions TAB SAB Ect Mult Living                  Review of Systems  Constitutional: Negative for fever and chills.  Respiratory: Negative for shortness of breath.   Gastrointestinal: Negative for nausea and vomiting.  Musculoskeletal: Positive for back pain (chronic).  Neurological: Positive for headaches. Negative  for weakness.  All other systems reviewed and are negative.    Allergies  Bee venom and Vicodin  Home Medications   Current Outpatient Rx  Name  Route  Sig  Dispense  Refill  . PHENYLEPHRINE-ACETAMINOPHEN 5-325 MG PO TABS   Oral   Take 2 tablets by mouth every 6 (six) hours as needed. headaches         . GABAPENTIN 100 MG PO CAPS   Oral   Take 100 mg by mouth 3 (three) times daily.           Triage Vitals: BP 156/79  Pulse 93  Temp 98.2 F (36.8 C) (Oral)  Resp 18  SpO2 100%  Physical Exam  Nursing note and vitals reviewed. Constitutional: She is oriented to person, place, and time. She appears well-developed and well-nourished. No distress.  HENT:  Head: Normocephalic and atraumatic.  Eyes: EOM are normal.  Neck: Neck supple. No tracheal deviation present.       Tender left paraspinal neck  Cardiovascular: Normal rate.   Pulmonary/Chest: Effort normal. No respiratory distress.  Musculoskeletal: Normal range of motion.  Neurological: She is alert and oriented to person, place, and time.  Skin: Skin is warm and dry.  Psychiatric: She has a normal mood and affect. Her behavior is normal.    ED Course  Procedures (including  critical care time) DIAGNOSTIC STUDIES: Oxygen Saturation is 100% on room air, normal by my interpretation.    COORDINATION OF CARE: At 1345 Discussed treatment plan with patient which includes toradol. Patient agrees.   Labs Reviewed - No data to display No results found.   No diagnosis found.    MDM  The chart was scribed for me under my direct supervision.  I personally performed the history, physical, and medical decision making and all procedures in the evaluation of this patient.Rebecca Lennert, MD 11/13/12 [phone]

## 2012-11-14 ENCOUNTER — Telehealth: Payer: Self-pay | Admitting: Orthopedic Surgery

## 2012-11-14 NOTE — Telephone Encounter (Signed)
No   We need to refer her to a neurosurgeon

## 2012-11-14 NOTE — Telephone Encounter (Signed)
Faxed referral to [facility]. Awaiting appointment.

## 2012-11-14 NOTE — Telephone Encounter (Signed)
Tammy, will you make this referral?

## 2012-11-14 NOTE — Telephone Encounter (Signed)
Rebecca Odom has had her 3rd ESI in July, she said the back pain was much better for  awhile but now has returned.  The pain has been pretty bad since October, now the pain goes into her neck causing severe headaches.  She went to the ER yesterday for a Headache and was given an injectoin of Toradol.  She is requesting an appoint here for the back pain.  Told her I would check with you before scheduling another appointment here.  Please advise.  Her # [phone]

## 2012-11-17 NOTE — Telephone Encounter (Signed)
Patient has appointment scheduled at [facility] for 11/25/12 with Dr. Phoebe Perch. Patient was notified by their office of the appointment date and time.

## 2012-11-28 ENCOUNTER — Telehealth: Payer: Self-pay | Admitting: Orthopedic Surgery

## 2012-11-28 NOTE — Telephone Encounter (Signed)
Patient had called to relay that she saw Dr. Phoebe Perch at [facility] per referral, and felt he "did not do much at the appointment." States she was prescribed Mobic, and also 3 weeks of physical therapy for her leg and knee; also mentioned has neck pain.  York Spaniel still has difficulty walking; sounded very upset.  I relayed Dr. Romeo Apple will be receiving a report - not received as of yet.  Any advice in the interim?  Patient Ph# [phone].

## 2012-11-29 NOTE — Telephone Encounter (Signed)
She is now in his care

## 2012-11-29 NOTE — Telephone Encounter (Signed)
Called back to patient, relayed.  She mentioned she may pursue a second opinion from another neurosurgeon.

## 2012-12-05 ENCOUNTER — Other Ambulatory Visit: Payer: Self-pay | Admitting: *Deleted

## 2012-12-05 DIAGNOSIS — M549 Dorsalgia, unspecified: Secondary | ICD-10-CM

## 2013-01-04 ENCOUNTER — Encounter (HOSPITAL_COMMUNITY): Payer: Self-pay | Admitting: *Deleted

## 2013-01-04 ENCOUNTER — Emergency Department (HOSPITAL_COMMUNITY)
Admission: EM | Admit: 2013-01-04 | Discharge: 2013-01-05 | Disposition: A | Discharge status: Home / Self Care | Payer: BC Managed Care – PPO | Attending: Emergency Medicine | Admitting: Emergency Medicine

## 2013-01-04 ENCOUNTER — Emergency Department (HOSPITAL_COMMUNITY): Payer: BC Managed Care – PPO

## 2013-01-04 DIAGNOSIS — K5732 Diverticulitis of large intestine without perforation or abscess without bleeding: Secondary | ICD-10-CM | POA: Insufficient documentation

## 2013-01-04 DIAGNOSIS — Z791 Long term (current) use of non-steroidal anti-inflammatories (NSAID): Secondary | ICD-10-CM | POA: Insufficient documentation

## 2013-01-04 DIAGNOSIS — R112 Nausea with vomiting, unspecified: Secondary | ICD-10-CM | POA: Insufficient documentation

## 2013-01-04 DIAGNOSIS — Z79899 Other long term (current) drug therapy: Secondary | ICD-10-CM | POA: Insufficient documentation

## 2013-01-04 DIAGNOSIS — R197 Diarrhea, unspecified: Secondary | ICD-10-CM

## 2013-01-04 DIAGNOSIS — N760 Acute vaginitis: Secondary | ICD-10-CM | POA: Insufficient documentation

## 2013-01-04 DIAGNOSIS — Z3202 Encounter for pregnancy test, result negative: Secondary | ICD-10-CM | POA: Insufficient documentation

## 2013-01-04 DIAGNOSIS — R109 Unspecified abdominal pain: Secondary | ICD-10-CM

## 2013-01-04 DIAGNOSIS — Z8739 Personal history of other diseases of the musculoskeletal system and connective tissue: Secondary | ICD-10-CM | POA: Insufficient documentation

## 2013-01-04 DIAGNOSIS — K5790 Diverticulosis of intestine, part unspecified, without perforation or abscess without bleeding: Secondary | ICD-10-CM

## 2013-01-04 DIAGNOSIS — B9689 Other specified bacterial agents as the cause of diseases classified elsewhere: Secondary | ICD-10-CM

## 2013-01-04 LAB — CBC
HCT: 37.8 % (ref 36.0–46.0)
Hemoglobin: 12.7 g/dL (ref 12.0–15.0)
MCHC: 33.6 g/dL (ref 30.0–36.0)
MCV: 84.2 fL (ref 78.0–100.0)
WBC: 14.9 10*3/uL — ABNORMAL HIGH (ref 4.0–10.5)

## 2013-01-04 LAB — URINALYSIS, ROUTINE W REFLEX MICROSCOPIC
Bilirubin Urine: NEGATIVE
Leukocytes, UA: NEGATIVE
Nitrite: NEGATIVE
Specific Gravity, Urine: >1.03 — ABNORMAL HIGH (ref 1.005–1.030)
Urobilinogen, UA: 0.2 mg/dL (ref 0.0–1.0)
pH: 6 (ref 5.0–8.0)

## 2013-01-04 LAB — COMPREHENSIVE METABOLIC PANEL
ALT: 10 U/L (ref 0–35)
Albumin: 3.6 g/dL (ref 3.5–5.2)
Calcium: 9.5 mg/dL (ref 8.4–10.5)
GFR calc Af Amer: >90 mL/min (ref >90)
Glucose, Bld: 82 mg/dL (ref 70–99)
Sodium: 137 mEq/L (ref 135–145)
Total Protein: 8.3 g/dL (ref 6.0–8.3)

## 2013-01-04 LAB — WET PREP, GENITAL: Yeast Wet Prep HPF POC: NONE SEEN

## 2013-01-04 MED ORDER — IOHEXOL 300 MG/ML  SOLN
100.0000 mL | Freq: Once | INTRAMUSCULAR | Status: AC | PRN
  Administered 2013-01-04: 100 mL via INTRAVENOUS

## 2013-01-04 MED ORDER — IOHEXOL 300 MG/ML  SOLN
50.0000 mL | Freq: Once | INTRAMUSCULAR | Status: AC | PRN
  Administered 2013-01-04: 50 mL via ORAL

## 2013-01-04 NOTE — ED Provider Notes (Signed)
History     CSN: [id number]  Arrival date & time 01/04/13  1717   First MD Initiated Contact with Patient 01/04/13 2049      Chief Complaint  Patient presents with  . Abdominal Pain    (Consider location/radiation/quality/duration/timing/severity/associated sxs/prior treatment) HPI Comments: Patient is a 42 year old woman who comes complaining of abdominal pain. She says that this started about a week ago, with nausea and vomiting.  About 5 days ago she developed abdominal pain that was in the periumbilical and lower abdomen. She was seen by her primary care physician 2 days ago and was prescribed nitrofurantoin and omeprazole. Yesterday she developed diarrhea. She had a white blood count that was elevated. She saw her primary care physician today who sent her to [facility] ED for further evaluation. She's had prior gallbladder surgery.  Patient is a 42 y.o. female presenting with abdominal pain. The history is provided by the patient. No language interpreter was used.  Abdominal Pain Pain location:  Periumbilical and suprapubic Pain quality: cramping   Pain radiates to:  Does not radiate Pain severity:  Severe (Pain is rated at an 8 by the patient.) Onset quality:  Gradual Duration:  1 week Timing:  Intermittent Progression:  Worsening Chronicity:  New Context comment:  Initial nausea and vomiting, and developed lower abdominal pain and diarrhea. Relieved by: She had some improvement with nitrofurantoin and omeprazole, but then developed diarrhea. Associated symptoms: diarrhea, nausea and vomiting   Associated symptoms: no chills and no fever   Risk factors: obesity     Past Medical History  Diagnosis Date  . Bulging lumbar disc     Past Surgical History  Procedure Laterality Date  . Tubal ligation    . Cholecystectomy      Family History  Problem Relation Age of Onset  . Heart disease    . Diabetes      History  Substance Use Topics  . Smoking status: Never  Smoker   . Smokeless tobacco: Not on file  . Alcohol Use: Yes     Comment: occ    OB History   Grav Para Term Preterm Abortions TAB SAB Ect Mult Living                  Review of Systems  Constitutional: Negative for fever and chills.  HENT: Negative.   Eyes: Negative.   Respiratory: Negative.   Cardiovascular: Negative.   Gastrointestinal: Positive for nausea, vomiting, abdominal pain and diarrhea.  Genitourinary: Negative.   Musculoskeletal: Negative.   Skin: Negative.   Neurological: Negative.   Psychiatric/Behavioral: Negative.     Allergies  Bee venom and Vicodin  Home Medications   Current Outpatient Rx  Name  Route  Sig  Dispense  Refill  . ibuprofen (ADVIL,MOTRIN) 800 MG tablet   Oral   Take 800 mg by mouth every 8 (eight) hours as needed. One every 8 hours for headache         . nitrofurantoin, macrocrystal-monohydrate, (MACROBID) 100 MG capsule   Oral   Take 100 mg by mouth 2 (two) times daily. For 7 days         . omeprazole (PRILOSEC) 20 MG capsule   Oral   Take 20 mg by mouth 2 (two) times daily.         Marland Kitchen gabapentin (NEURONTIN) 100 MG capsule   Oral   Take 100 mg by mouth daily as needed (for pain/neuropathy).  BP 137/57  Pulse 90  Temp(Src) 98.5 F (36.9 C) (Oral)  Resp 18  Ht 5\' 3"  (1.6 m)  Wt 247 lb (112.038 kg)  BMI 43.76 kg/m2  SpO2 99%  Physical Exam  Nursing note and vitals reviewed. Constitutional: She is oriented to person, place, and time.  Morbidly obese woman in moderate distress.  HENT:  Head: Normocephalic and atraumatic.  Right Ear: External ear normal.  Left Ear: External ear normal.  Mouth/Throat: Oropharynx is clear and moist.  Eyes: Conjunctivae and EOM are normal. Pupils are equal, round, and reactive to light.  Neck: Normal range of motion. Neck supple.  Cardiovascular: Normal rate, regular rhythm and normal heart sounds.   Pulmonary/Chest: Effort normal and breath sounds normal.  Abdominal:  Soft. Tenderness: she has periumbilical and suprapubic tenderness. There is no mass.  Genitourinary:  Normal external genitalia.  Speculum exam shows minimal white discharge.  Bimanual exam shows mild uterine and left adnexal tenderness; bimanual exam limited by pt's body habitus.  Musculoskeletal: Normal range of motion.  Neurological: She is alert and oriented to person, place, and time.  Skin: Skin is warm and dry.  Psychiatric: She has a normal mood and affect. Her behavior is normal.    ED Course  Procedures (including critical care time)  Results for orders placed during the hospital encounter of 01/04/13  WET PREP, GENITAL      Result Value Range   Yeast Wet Prep HPF POC NONE SEEN  NONE SEEN   Trich, Wet Prep NONE SEEN  NONE SEEN   Clue Cells Wet Prep HPF POC FEW (*) NONE SEEN   WBC, Wet Prep HPF POC MANY (*) NONE SEEN  URINALYSIS, ROUTINE W REFLEX MICROSCOPIC      Result Value Range   Color, Urine YELLOW  YELLOW   APPearance CLEAR  CLEAR   Specific Gravity, Urine >1.030 (*) 1.005 - 1.030   pH 6.0  5.0 - 8.0   Glucose, UA NEGATIVE  NEGATIVE mg/dL   Hgb urine dipstick NEGATIVE  NEGATIVE   Bilirubin Urine NEGATIVE  NEGATIVE   Ketones, ur NEGATIVE  NEGATIVE mg/dL   Protein, ur NEGATIVE  NEGATIVE mg/dL   Urobilinogen, UA 0.2  0.0 - 1.0 mg/dL   Nitrite NEGATIVE  NEGATIVE   Leukocytes, UA NEGATIVE  NEGATIVE  PREGNANCY, URINE      Result Value Range   Preg Test, Ur NEGATIVE  NEGATIVE  COMPREHENSIVE METABOLIC PANEL      Result Value Range   Sodium 137  135 - 145 mEq/L   Potassium 3.9  3.5 - 5.1 mEq/L   Chloride 101  96 - 112 mEq/L   CO2 24  19 - 32 mEq/L   Glucose, Bld 82  70 - 99 mg/dL   BUN 12  6 - 23 mg/dL   Creatinine, Ser 1.61  0.50 - 1.10 mg/dL   Calcium 9.5  8.4 - 09.6 mg/dL   Total Protein 8.3  6.0 - 8.3 g/dL   Albumin 3.6  3.5 - 5.2 g/dL   AST 10  0 - 37 U/L   ALT 10  0 - 35 U/L   Alkaline Phosphatase 81  39 - 117 U/L   Total Bilirubin 0.2 (*) 0.3 - 1.2  mg/dL   GFR calc non Af Amer 81 (*) >90 mL/min   GFR calc Af Amer >90  >90 mL/min  CBC      Result Value Range   WBC 14.9 (*) 4.0 - 10.5 K/uL  RBC 4.49  3.87 - 5.11 MIL/uL   Hemoglobin 12.7  12.0 - 15.0 g/dL   HCT 16.1  09.6 - 04.5 %   MCV 84.2  78.0 - 100.0 fL   MCH 28.3  26.0 - 34.0 pg   MCHC 33.6  30.0 - 36.0 g/dL   RDW 40.9  81.1 - 91.4 %   Platelets 331  150 - 400 K/uL       10:39 PM Pt was seen and had physical examination.  Lab tests showed elevated WBC, UA was negative, and wet prep showed a few clue cells.  CT of abdomen and pelvis is pending.  Signed out to Dr. Devoria Albe at 10:40 P.M.      No diagnosis found.         Carleene Cooper III, MD 01/04/13 2242

## 2013-01-04 NOTE — ED Notes (Signed)
Pt states she was sent here from [facility]. Generalized abdominal pain, worse to center and left. Elevated WBC also. Had diarrhea yesterday. Vomited x 1 yesterday.

## 2013-01-05 MED ORDER — METRONIDAZOLE 500 MG PO TABS: 500.0000 mg | ORAL_TABLET | Freq: Two times a day (BID) | ORAL | Status: DC

## 2013-01-05 MED ORDER — TRAMADOL HCL 50 MG PO TABS: 100.0000 mg | ORAL_TABLET | Freq: Four times a day (QID) | ORAL | Status: DC | PRN

## 2013-01-05 NOTE — ED Provider Notes (Signed)
Patient left with me at change of shift to check on CT results. Patient reports she is feeling better with her current medication. We discussed her CT results including that she has diverticulosis. She also has bacterial vaginosis. She has been on omeprazole and Macrodantin this week.  Ct Abdomen Pelvis W Contrast  01/04/2013  *RADIOLOGY REPORT*  Clinical Data: Abdominal pain  CT ABDOMEN AND PELVIS WITH CONTRAST  Technique:  Multidetector CT imaging of the abdomen and pelvis was performed following the standard protocol during bolus administration of intravenous contrast.  Contrast: 50mL OMNIPAQUE IOHEXOL 300 MG/ML  SOLN, OMNIPAQUE IOHEXOL 300 MG/ML  SOLN  Comparison: 12/06/2011  Findings: Lung bases are clear.  Heart size within upper normal limits.  No pleural or pericardial effusion.  Unremarkable liver, biliary system, spleen, pancreas, adrenal glands.  A couple too small further characterize hypodensities within the kidneys.  No hydronephrosis or hydroureter.  Colonic diverticulosis.  No overt evidence for diverticulitis or colitis, however, colon evaluation is limited due to the decompressed state.  Normal appendix.  Small bowel loops are normal course and caliber.  No free intraperitoneal air or fluid.  No lymphadenopathy.  Normal caliber aorta and branch vessels.  Bladder wall thickening is nonspecific given incomplete distension. Unremarkable CT appearance to the uterus and adnexa.  No acute osseous finding.  Degenerative disc disease L5-S1.  IMPRESSION: Colonic diverticulosis without overt evidence for colitis or diverticulitis.   Original Report Authenticated By: Jearld Lesch, M.D.    Diagnoses that have been ruled out:  None  Diagnoses that are still under consideration:  None  Final diagnoses:  Diarrhea  Abdominal pain  BV (bacterial vaginosis)  Diverticulosis    New Prescriptions   METRONIDAZOLE (FLAGYL) 500 MG TABLET    Take 1 tablet (500 mg total) by mouth 2 (two) times  daily.   TRAMADOL (ULTRAM) 50 MG TABLET    Take 2 tablets (100 mg total) by mouth every 6 (six) hours as needed for pain.    Plan discharge  Devoria Albe, MD, Franz Dell, MD 01/05/13 [phone]

## 2013-01-06 LAB — GC/CHLAMYDIA PROBE AMP: GC Probe RNA: NEGATIVE

## 2013-01-11 ENCOUNTER — Encounter (INDEPENDENT_AMBULATORY_CARE_PROVIDER_SITE_OTHER): Payer: Self-pay | Admitting: *Deleted

## 2013-01-12 ENCOUNTER — Encounter (INDEPENDENT_AMBULATORY_CARE_PROVIDER_SITE_OTHER): Payer: Self-pay | Admitting: Internal Medicine

## 2013-01-12 ENCOUNTER — Ambulatory Visit (INDEPENDENT_AMBULATORY_CARE_PROVIDER_SITE_OTHER): Payer: BC Managed Care – PPO | Admitting: Internal Medicine

## 2013-01-12 VITALS — BP 140/80 | HR 72 | Temp 98.6°F | Ht 65.0 in | Wt 250.0 lb

## 2013-01-12 DIAGNOSIS — R109 Unspecified abdominal pain: Secondary | ICD-10-CM | POA: Insufficient documentation

## 2013-01-12 MED ORDER — METRONIDAZOLE 500 MG PO TABS: 500.0000 mg | ORAL_TABLET | Freq: Two times a day (BID) | ORAL | Status: DC

## 2013-01-12 MED ORDER — CIPROFLOXACIN HCL 500 MG PO TABS: 500.0000 mg | ORAL_TABLET | Freq: Two times a day (BID) | ORAL | Status: DC

## 2013-01-12 MED ORDER — CIPROFLOXACIN HCL 500 MG PO TABS: 500.0000 mg | ORAL_TABLET | Freq: Two times a day (BID) | ORAL | Status: AC

## 2013-01-12 NOTE — Progress Notes (Signed)
Subjective:     Patient ID: Rebecca Odom, female   DOB: October 25, 1971, 42 y.o.   MRN: [id number]  HPI  Recently seen in the ED  01/04/2013 for abdominal pain. Pain x 1 week. Nausea and vomiting was associated with her symptoms. Pain was in the periumbilical and lower abdomen. She had been seen by her PCP 2 days prior to going to the ED and was prescrbed Nitrofurantoin and omeprazole for a UTI. She later developed diarrhea. She did have a fever associated with her symptoms. WBC ct was elevated and she was referred to the ED for further evaluation.  Empirically treated for diverticulitis.  States she is weak. She is eating. She continues to have periumbilical pain and left mid abdominal pain. Rates the pain 7/10.  She thinks she may have seven pounds since she became sick.  She tells me her stools are loose and like water.  No bright red rectal bleeding. She is having 4-6 loose stools a days       01/04/2013 *RADIOLOGY REPORT* Clinical Data: Abdominal pain CT ABDOMEN AND PELVIS WITH CONTRAST Technique: Multidetector CT imaging of the abdomen and pelvis was performed following the standard protocol during bolus administration of intravenous contrast. Contrast: 50mL OMNIPAQUE IOHEXOL 300 MG/ML SOLN, OMNIPAQUE IOHEXOL 300 MG/ML SOLN Comparison: 12/06/2011 Findings: Lung bases are clear. Heart size within upper normal limits. No pleural or pericardial effusion. Unremarkable liver, biliary system, spleen, pancreas, adrenal glands. A couple too small further characterize hypodensities within the kidneys. No hydronephrosis or hydroureter. Colonic diverticulosis. No overt evidence for diverticulitis or colitis, however, colon evaluation is limited due to the decompressed state. Normal appendix. Small bowel loops are normal course and caliber. No free intraperitoneal air or fluid. No lymphadenopathy. Normal caliber aorta and branch vessels. Bladder wall thickening is nonspecific given incomplete distension.  Unremarkable CT appearance to the uterus and adnexa. No acute osseous finding. Degenerative disc disease L5-S1. IMPRESSION: Colonic diverticulosis without overt evidence for colitis or diverticulitis. Original Report Authenticated By: Jearld Lesch, M.D.  CBC    Component Value Date/Time   WBC 14.9* 01/04/2013 1829   RBC 4.49 01/04/2013 1829   HGB 12.7 01/04/2013 1829   HCT 37.8 01/04/2013 1829   PLT 331 01/04/2013 1829   MCV 84.2 01/04/2013 1829   MCH 28.3 01/04/2013 1829   MCHC 33.6 01/04/2013 1829   RDW 13.0 01/04/2013 1829   LYMPHSABS 5.4* 12/06/2011 1330   MONOABS 0.9 12/06/2011 1330   EOSABS 0.4 12/06/2011 1330   BASOSABS 0.0 12/06/2011 1330   CMP     Component Value Date/Time   NA 137 01/04/2013 1829   K 3.9 01/04/2013 1829   CL 101 01/04/2013 1829   CO2 24 01/04/2013 1829   GLUCOSE 82 01/04/2013 1829   BUN 12 01/04/2013 1829   CREATININE 0.88 01/04/2013 1829   CALCIUM 9.5 01/04/2013 1829   PROT 8.3 01/04/2013 1829   ALBUMIN 3.6 01/04/2013 1829   AST 10 01/04/2013 1829   ALT 10 01/04/2013 1829   ALKPHOS 81 01/04/2013 1829   BILITOT 0.2* 01/04/2013 1829   GFRNONAA 81* 01/04/2013 1829   GFRAA >90 01/04/2013 1829    Urinalysis    Component Value Date/Time   COLORURINE YELLOW 01/04/2013 2022   APPEARANCEUR CLEAR 01/04/2013 2022   LABSPEC >1.030* 01/04/2013 2022   PHURINE 6.0 01/04/2013 2022   GLUCOSEU NEGATIVE 01/04/2013 2022   HGBUR NEGATIVE 01/04/2013 2022   BILIRUBINUR NEGATIVE 01/04/2013 2022   KETONESUR NEGATIVE 01/04/2013 2022  PROTEINUR NEGATIVE 01/04/2013 2022   UROBILINOGEN 0.2 01/04/2013 2022   NITRITE NEGATIVE 01/04/2013 2022   LEUKOCYTESUR NEGATIVE 01/04/2013 2022     Drugs of Abuse  No results found for this basename: labopia, cocainscrnur, labbenz, amphetmu, thcu, labbarb       Review of Systemssee hpi Current Outpatient Prescriptions  Medication Sig Dispense Refill  . ciprofloxacin (CIPRO) 500 MG tablet Take 1 tablet (500 mg total) by mouth 2 (two) times daily.  14  tablet  0  . gabapentin (NEURONTIN) 100 MG capsule Take 100 mg by mouth daily as needed (for pain/neuropathy).       Marland Kitchen ibuprofen (ADVIL,MOTRIN) 800 MG tablet Take 800 mg by mouth every 8 (eight) hours as needed. One every 8 hours for headache      . metroNIDAZOLE (FLAGYL) 500 MG tablet Take 1 tablet (500 mg total) by mouth 2 (two) times daily.  14 tablet  0  . omeprazole (PRILOSEC) 20 MG capsule Take 20 mg by mouth 2 (two) times daily.      . traMADol (ULTRAM) 50 MG tablet Take 2 tablets (100 mg total) by mouth every 6 (six) hours as needed for pain.  16 tablet  0   No current facility-administered medications for this visit.   Past Medical History  Diagnosis Date  . Bulging lumbar disc    Past Surgical History  Procedure Laterality Date  . Tubal ligation    . Cholecystectomy     Allergies  Allergen Reactions  . Bee Venom Shortness Of Breath and Swelling    Was prescribed epi-pen  . Vicodin (Hydrocodone-Acetaminophen) Anaphylaxis        Objective:   Physical Exam  Filed Vitals:   01/12/13 1501  Height: 5\' 5"  (1.651 m)  Weight: 250 lb (113.399 kg)  Alert and oriented. Skin warm and dry. Oral mucosa is moist.   . Sclera anicteric, conjunctivae is pink. Thyroid not enlarged. No cervical lymphadenopathy. Lungs clear. Heart regular rate and rhythm.  Abdomen is soft. Bowel sounds are positive. No hepatomegaly. No abdominal masses felt. Left lower quadrant tenderness and  Left mid abdominal tenderness..  No edema to lower extremities.   Patient does not appear toxic.     Assessment:    Possible diverticulitis with left lower quadrant tenderness and mid left abdominal tenderness. Symptoms are better now.     Plan:   Will add Cipro with her Flagyl for 7 more days and have her come back in 2 weeks. If her symptoms worsen she was advised to go to the emergency dept.

## 2013-01-12 NOTE — Patient Instructions (Addendum)
Will start on Cipro and Flagyl and empirically tx for diverticulitis x 7 more days. OV in 2 weeks.

## 2013-01-13 ENCOUNTER — Telehealth (INDEPENDENT_AMBULATORY_CARE_PROVIDER_SITE_OTHER): Payer: Self-pay | Admitting: Internal Medicine

## 2013-01-13 LAB — CBC WITH DIFFERENTIAL/PLATELET
Basophils Absolute: 0 10*3/uL (ref 0.0–0.1)
Basophils Relative: 0 % (ref 0–1)
MCHC: 33.6 g/dL (ref 30.0–36.0)
Neutro Abs: 8.1 10*3/uL — ABNORMAL HIGH (ref 1.7–7.7)
Neutrophils Relative %: 57 % (ref 43–77)
Platelets: 349 10*3/uL (ref 150–400)
RDW: 13.8 % (ref 11.5–15.5)

## 2013-01-13 NOTE — Telephone Encounter (Signed)
No answer at home. Could not leave message.

## 2013-01-17 ENCOUNTER — Telehealth (INDEPENDENT_AMBULATORY_CARE_PROVIDER_SITE_OTHER): Payer: Self-pay | Admitting: *Deleted

## 2013-01-17 DIAGNOSIS — B373 Candidiasis of vulva and vagina: Secondary | ICD-10-CM

## 2013-01-17 MED ORDER — FLUCONAZOLE 100 MG PO TABS: 100.0000 mg | ORAL_TABLET | Freq: Every day | ORAL | Status: AC

## 2013-01-17 NOTE — Telephone Encounter (Signed)
Tyniah said the antibiotics she was put on has given her a yeast infection. Would like something called in to [facility] in [place]. The return phone number is [phone].

## 2013-01-17 NOTE — Telephone Encounter (Signed)
C/o vaginal yeast infection. Will call in Diflucan

## 2013-01-26 ENCOUNTER — Ambulatory Visit (INDEPENDENT_AMBULATORY_CARE_PROVIDER_SITE_OTHER): Payer: BC Managed Care – PPO | Admitting: Internal Medicine

## 2013-01-26 ENCOUNTER — Encounter (INDEPENDENT_AMBULATORY_CARE_PROVIDER_SITE_OTHER): Payer: Self-pay | Admitting: Internal Medicine

## 2013-01-26 ENCOUNTER — Other Ambulatory Visit (INDEPENDENT_AMBULATORY_CARE_PROVIDER_SITE_OTHER): Payer: Self-pay | Admitting: *Deleted

## 2013-01-26 ENCOUNTER — Telehealth (INDEPENDENT_AMBULATORY_CARE_PROVIDER_SITE_OTHER): Payer: Self-pay | Admitting: *Deleted

## 2013-01-26 VITALS — BP 142/80 | HR 76 | Temp 97.6°F | Ht 63.0 in | Wt 246.9 lb

## 2013-01-26 DIAGNOSIS — D649 Anemia, unspecified: Secondary | ICD-10-CM

## 2013-01-26 DIAGNOSIS — Z1211 Encounter for screening for malignant neoplasm of colon: Secondary | ICD-10-CM

## 2013-01-26 DIAGNOSIS — K5732 Diverticulitis of large intestine without perforation or abscess without bleeding: Secondary | ICD-10-CM

## 2013-01-26 MED ORDER — PEG-KCL-NACL-NASULF-NA ASC-C 100 G PO SOLR: 1.0000 | Freq: Once | ORAL | Status: DC

## 2013-01-26 NOTE — Telephone Encounter (Signed)
Patient needs movi prep 

## 2013-01-26 NOTE — Progress Notes (Signed)
Subjective:     Patient ID: Rebecca Odom, female   DOB: Sep 03, 1971, 42 y.o.   MRN: [id number]  HPI  Here today for f/u. She was last seen 01/12/2013 for left lower abdominal pain. She had been seen in the ED on 01/03/2013 for abdominal pain and diarrhea. She also had a fever associated with her symptoms. She was treated with Cipro and Flagyl x 14 days.  She tells me she is better. Her BMs are slightly loose. She is having about 2-3 BMs a day. No rectal bleeding.  She does tell me she has some vague abdominal pain just below her umblicus. Her WBC on 01/12/2013 is elevated but has been elevated for greater than a year. H and H slight low at 11.5 and 34.2 Hx of uterine ablation. She does not have a period. Appetite is okay. She has lost weight intentional due to exercise.    01/04/2013 *RADIOLOGY REPORT* Clinical Data: Abdominal pain CT ABDOMEN AND PELVIS WITH CONTRAST Technique: Multidetector CT imaging of the abdomen and pelvis was performed following the standard protocol during bolus administration of intravenous contrast. Contrast: 50mL OMNIPAQUE IOHEXOL 300 MG/ML SOLN, OMNIPAQUE IOHEXOL 300 MG/ML SOLN Comparison: 12/06/2011 Findings: Lung bases are clear. Heart size within upper normal limits. No pleural or pericardial effusion. Unremarkable liver, biliary system, spleen, pancreas, adrenal glands. A couple too small further characterize hypodensities within the kidneys. No hydronephrosis or hydroureter. Colonic diverticulosis. No overt evidence for diverticulitis or colitis, however, colon evaluation is limited due to the decompressed state. Normal appendix. Small bowel loops are normal course and caliber. No free intraperitoneal air or fluid. No lymphadenopathy. Normal caliber aorta and branch vessels. Bladder wall thickening is nonspecific given incomplete distension. Unremarkable CT appearance to the uterus and adnexa. No acute osseous finding. Degenerative disc disease L5-S1. IMPRESSION: Colonic  diverticulosis without overt evidence for colitis or diverticulitis. Original Report Authenticated By: Jearld Lesch, M.D.  CBC    Component Value Date/Time   WBC 14.5* 01/12/2013 1509   RBC 4.23 01/12/2013 1509   HGB 11.5* 01/12/2013 1509   HCT 34.2* 01/12/2013 1509   PLT 349 01/12/2013 1509   MCV 80.9 01/12/2013 1509   MCH 27.2 01/12/2013 1509   MCHC 33.6 01/12/2013 1509   RDW 13.8 01/12/2013 1509   LYMPHSABS 5.4* 01/12/2013 1509   MONOABS 0.8 01/12/2013 1509   EOSABS 0.2 01/12/2013 1509   BASOSABS 0.0 01/12/2013 1509       Review of Systems see hpi Current Outpatient Prescriptions  Medication Sig Dispense Refill  . gabapentin (NEURONTIN) 100 MG capsule Take 100 mg by mouth daily as needed (for pain/neuropathy).       Marland Kitchen ibuprofen (ADVIL,MOTRIN) 800 MG tablet Take 800 mg by mouth every 8 (eight) hours as needed. One every 8 hours for headache      . omeprazole (PRILOSEC) 20 MG capsule Take 20 mg by mouth 2 (two) times daily.      . traMADol (ULTRAM) 50 MG tablet Take 2 tablets (100 mg total) by mouth every 6 (six) hours as needed for pain.  16 tablet  0   No current facility-administered medications for this visit.   Past Medical History  Diagnosis Date  . Bulging lumbar disc    Past Surgical History  Procedure Laterality Date  . Tubal ligation    . Cholecystectomy     Allergies  Allergen Reactions  . Bee Venom Shortness Of Breath and Swelling    Was prescribed epi-pen  . Vicodin (Hydrocodone-Acetaminophen)  Anaphylaxis        Objective:   Physical Exam  Filed Vitals:   01/26/13 1531  BP: 142/80  Pulse: 76  Temp: 97.6 F (36.4 C)  Height: 5\' 3"  (1.6 m)  Weight: 246 lb 14.4 oz (111.993 kg)        Assessment:    Diverticulitis. Resolved. She still has vague lower abdominal pain. Slightly anemic. Colonic neoplasm, AVM, polyp needs to be ruled out.    Plan:    Colonoscopy with Dr Karilyn Cota. Iron studies today

## 2013-01-26 NOTE — Patient Instructions (Addendum)
Iron studies, colonoscopy.The risks and benefits such as perforation, bleeding, and infection were reviewed with the patient and is agreeable.

## 2013-01-27 LAB — FERRITIN: Ferritin: 87 ng/mL (ref 10–291)

## 2013-01-27 LAB — IRON AND TIBC: Iron: 43 ug/dL (ref 42–145)

## 2013-02-14 ENCOUNTER — Ambulatory Visit (INDEPENDENT_AMBULATORY_CARE_PROVIDER_SITE_OTHER): Payer: BC Managed Care – PPO | Admitting: Internal Medicine

## 2013-02-14 ENCOUNTER — Encounter (INDEPENDENT_AMBULATORY_CARE_PROVIDER_SITE_OTHER): Payer: Self-pay | Admitting: *Deleted

## 2013-02-14 ENCOUNTER — Encounter (INDEPENDENT_AMBULATORY_CARE_PROVIDER_SITE_OTHER): Payer: Self-pay | Admitting: Internal Medicine

## 2013-02-14 VITALS — BP 132/72 | HR 64 | Temp 97.4°F | Ht 63.0 in | Wt 246.5 lb

## 2013-02-14 DIAGNOSIS — R52 Pain, unspecified: Secondary | ICD-10-CM

## 2013-02-14 DIAGNOSIS — R1032 Left lower quadrant pain: Secondary | ICD-10-CM

## 2013-02-14 LAB — CBC WITH DIFFERENTIAL/PLATELET
Basophils Absolute: 0 10*3/uL (ref 0.0–0.1)
Eosinophils Relative: 1 % (ref 0–5)
HCT: 36.8 % (ref 36.0–46.0)
Lymphocytes Relative: 35 % (ref 12–46)
MCHC: 33.2 g/dL (ref 30.0–36.0)
MCV: 81.6 fL (ref 78.0–100.0)
Monocytes Absolute: 0.9 10*3/uL (ref 0.1–1.0)
RDW: 13.5 % (ref 11.5–15.5)
WBC: 14.6 10*3/uL — ABNORMAL HIGH (ref 4.0–10.5)

## 2013-02-14 MED ORDER — CIPROFLOXACIN HCL 500 MG PO TABS: 500.0000 mg | ORAL_TABLET | Freq: Two times a day (BID) | ORAL | Status: AC

## 2013-02-14 MED ORDER — METRONIDAZOLE 500 MG PO TABS: 500.0000 mg | ORAL_TABLET | Freq: Two times a day (BID) | ORAL | Status: DC

## 2013-02-14 NOTE — Progress Notes (Signed)
Subjective:     Patient ID: Rebecca Odom, female   DOB: [birth year], 42 y.o.   MRN: [id number]  HPI C/o lower abdominal pain and left lower quadrant pain.  There has been no fever with her symptoms. No chills. Symptoms started Sunday. She also has had rectal bleeding. When she has a BM she see blood on the tissue. BMs have been normal.  She also c/o of a bad taste in her mouth on occasion.  Seen in the ED in February and empirically tx for diverticulitis. Treated with Flagyl and Cipro.  No urinary symptoms  CBC    Component Value Date/Time   WBC 14.5* 01/12/2013 1509   RBC 4.23 01/12/2013 1509   HGB 11.5* 01/12/2013 1509   HCT 34.2* 01/12/2013 1509   PLT 349 01/12/2013 1509   MCV 80.9 01/12/2013 1509   MCH 27.2 01/12/2013 1509   MCHC 33.6 01/12/2013 1509   RDW 13.8 01/12/2013 1509   LYMPHSABS 5.4* 01/12/2013 1509   MONOABS 0.8 01/12/2013 1509   EOSABS 0.2 01/12/2013 1509   BASOSABS 0.0 01/12/2013 1509  (WBC ct has been elevated in the past ).    01/04/2013 CT abdomen/pelvis with CM: abdominal pain: IMPRESSION:  Colonic diverticulosis without overt evidence for colitis or  diverticulitis.  Review of Systems Past Medical History  Diagnosis Date  . Bulging lumbar disc    Current outpatient prescriptions:gabapentin (NEURONTIN) 100 MG capsule, Take 100 mg by mouth daily as needed (for pain/neuropathy). , Disp: , Rfl: ;  ibuprofen (ADVIL,MOTRIN) 800 MG tablet, Take 800 mg by mouth every 8 (eight) hours as needed. One every 8 hours for headache, Disp: , Rfl: ;  omeprazole (PRILOSEC) 20 MG capsule, Take 20 mg by mouth 2 (two) times daily., Disp: , Rfl:  traMADol (ULTRAM) 50 MG tablet, Take 2 tablets (100 mg total) by mouth every 6 (six) hours as needed for pain., Disp: 16 tablet, Rfl: 0;  peg 3350 powder (MOVIPREP) 100 G SOLR, Take 1 kit (100 g total) by mouth once., Disp: 1 kit, Rfl: 0 Past Surgical History  Procedure Laterality Date  . Tubal ligation    . Cholecystectomy     Allergies   Allergen Reactions  . Bee Venom Shortness Of Breath and Swelling    Was prescribed epi-pen  . Vicodin (Hydrocodone-Acetaminophen) Anaphylaxis        Objective:   Physical Exam  Filed Vitals:   02/14/13 1108  Height: 5\' 3"  (1.6 m)  Weight: 246 lb 8 oz (111.812 kg)  Alert and oriented. Skin warm and dry. Oral mucosa is moist.   . Sclera anicteric, conjunctivae is pink. Thyroid not enlarged. No cervical lymphadenopathy. Lungs clear. Heart regular rate and rhythm.  Abdomen is soft. Bowel sounds are positive. No hepatomegaly. No abdominal masses felt. No tenderness.  No edema to lower extremities.  Tenderness lower abdomen. Stool brown and guaiac negative.      Assessment:    Lower abdominal pain and left lower abdominal pain. ? Diverticulitis. No fever or chills associated with her symptoms.  Has just finished Cipro and Flagyl on 01/26/2013.    Plan:       Rx for Cipro and Flagyl today x 7 days. CBC today. If she continues to have pain will need to delay her colonoscopy for a few weeks.  She will call me on Thursday with a progress report.

## 2013-02-14 NOTE — Patient Instructions (Addendum)
CBC today. Will empirically tx for diverticulitis today. If pain worsens or you run a fever, go to the ED.

## 2013-02-27 ENCOUNTER — Encounter (HOSPITAL_COMMUNITY): Payer: Self-pay | Admitting: Pharmacy Technician

## 2013-02-28 ENCOUNTER — Encounter (HOSPITAL_COMMUNITY): Payer: Self-pay | Admitting: *Deleted

## 2013-02-28 ENCOUNTER — Emergency Department (HOSPITAL_COMMUNITY)
Admission: EM | Admit: 2013-02-28 | Discharge: 2013-02-28 | Disposition: A | Discharge status: Home / Self Care | Payer: BC Managed Care – PPO | Attending: Emergency Medicine | Admitting: Emergency Medicine

## 2013-02-28 DIAGNOSIS — Z79899 Other long term (current) drug therapy: Secondary | ICD-10-CM | POA: Insufficient documentation

## 2013-02-28 DIAGNOSIS — Z9089 Acquired absence of other organs: Secondary | ICD-10-CM | POA: Insufficient documentation

## 2013-02-28 DIAGNOSIS — R109 Unspecified abdominal pain: Secondary | ICD-10-CM

## 2013-02-28 DIAGNOSIS — Z9851 Tubal ligation status: Secondary | ICD-10-CM | POA: Insufficient documentation

## 2013-02-28 DIAGNOSIS — R1084 Generalized abdominal pain: Secondary | ICD-10-CM | POA: Insufficient documentation

## 2013-02-28 DIAGNOSIS — Z8739 Personal history of other diseases of the musculoskeletal system and connective tissue: Secondary | ICD-10-CM | POA: Insufficient documentation

## 2013-02-28 LAB — COMPREHENSIVE METABOLIC PANEL
ALT: 10 U/L (ref 0–35)
AST: 13 U/L (ref 0–37)
CO2: 26 mEq/L (ref 19–32)
Calcium: 9.7 mg/dL (ref 8.4–10.5)
Chloride: 101 mEq/L (ref 96–112)
GFR calc Af Amer: >90 mL/min (ref >90)
GFR calc non Af Amer: >90 mL/min (ref >90)
Glucose, Bld: 79 mg/dL (ref 70–99)
Sodium: 140 mEq/L (ref 135–145)
Total Bilirubin: 0.1 mg/dL — ABNORMAL LOW (ref 0.3–1.2)

## 2013-02-28 LAB — URINALYSIS, ROUTINE W REFLEX MICROSCOPIC
Bilirubin Urine: NEGATIVE
Ketones, ur: NEGATIVE mg/dL
Nitrite: NEGATIVE
Protein, ur: NEGATIVE mg/dL
Urobilinogen, UA: 0.2 mg/dL (ref 0.0–1.0)

## 2013-02-28 LAB — CBC WITH DIFFERENTIAL/PLATELET
Basophils Absolute: 0 10*3/uL (ref 0.0–0.1)
Eosinophils Relative: 2 % (ref 0–5)
HCT: 35.6 % — ABNORMAL LOW (ref 36.0–46.0)
Lymphocytes Relative: 32 % (ref 12–46)
Lymphs Abs: 4.5 10*3/uL — ABNORMAL HIGH (ref 0.7–4.0)
MCV: 84.2 fL (ref 78.0–100.0)
Monocytes Absolute: 0.8 10*3/uL (ref 0.1–1.0)
Neutro Abs: 8.6 10*3/uL — ABNORMAL HIGH (ref 1.7–7.7)
Platelets: 349 10*3/uL (ref 150–400)
RBC: 4.23 MIL/uL (ref 3.87–5.11)
RDW: 13 % (ref 11.5–15.5)
WBC: 14.2 10*3/uL — ABNORMAL HIGH (ref 4.0–10.5)

## 2013-02-28 LAB — URINE MICROSCOPIC-ADD ON

## 2013-02-28 MED ORDER — DICYCLOMINE HCL 20 MG PO TABS: 20.0000 mg | ORAL_TABLET | Freq: Four times a day (QID) | ORAL | Status: DC | PRN

## 2013-02-28 NOTE — ED Provider Notes (Signed)
History     CSN: [id number]  Arrival date & time 02/28/13  1510   First MD Initiated Contact with Patient 02/28/13 1539      Chief Complaint  Patient presents with  . Abdominal Pain    (Consider location/radiation/quality/duration/timing/severity/associated sxs/prior treatment) HPI Comments: Patient comes to the ER for evaluation of abdominal pain. Patient reports that she has been having abdominal pain for approximately 2 months. She has been treated with Cipro and Flagyl several times for diverticulitis. Patient reports that she is under the care of Dr. Karilyn Cota, is scheduled for colonoscopy in one week.  Patient is having increased pain diffusely in the abdomen. It is at its worst across the upper abdomen and in the lower part in the Center. No fever, nausea, vomiting or diarrhea.  Patient is a 42 y.o. female presenting with abdominal pain.  Abdominal Pain   Past Medical History  Diagnosis Date  . Bulging lumbar disc     Past Surgical History  Procedure Laterality Date  . Tubal ligation    . Cholecystectomy      Family History  Problem Relation Age of Onset  . Heart disease    . Diabetes      History  Substance Use Topics  . Smoking status: Never Smoker   . Smokeless tobacco: Not on file  . Alcohol Use: Yes     Comment: occ    OB History   Grav Para Term Preterm Abortions TAB SAB Ect Mult Living                  Review of Systems  Gastrointestinal: Positive for abdominal pain.  All other systems reviewed and are negative.    Allergies  Bee venom and Vicodin  Home Medications   Current Outpatient Rx  Name  Route  Sig  Dispense  Refill  . ciprofloxacin (CIPRO) 500 MG tablet   Oral   Take 500 mg by mouth 2 (two) times daily.         Marland Kitchen gabapentin (NEURONTIN) 100 MG capsule   Oral   Take 100 mg by mouth daily as needed (for pain/neuropathy).          Marland Kitchen ibuprofen (ADVIL,MOTRIN) 800 MG tablet   Oral   Take 800 mg by mouth every 8 (eight)  hours as needed. One every 8 hours for headache         . omeprazole (PRILOSEC) 20 MG capsule   Oral   Take 20 mg by mouth 2 (two) times daily.         . peg 3350 powder (MOVIPREP) 100 G SOLR   Oral   Take 1 kit (100 g total) by mouth once.   1 kit   0   . traMADol (ULTRAM) 50 MG tablet   Oral   Take 2 tablets (100 mg total) by mouth every 6 (six) hours as needed for pain.   16 tablet   0     BP 140/66  Pulse 72  Temp(Src) 96.9 F (36.1 C) (Oral)  Resp 18  SpO2 100%  Physical Exam  Constitutional: She is oriented to person, place, and time. She appears well-developed and well-nourished. No distress.  HENT:  Head: Normocephalic and atraumatic.  Right Ear: Hearing normal.  Nose: Nose normal.  Mouth/Throat: Oropharynx is clear and moist and mucous membranes are normal.  Eyes: Conjunctivae and EOM are normal. Pupils are equal, round, and reactive to light.  Neck: Normal range of motion. Neck supple.  Cardiovascular: Normal rate, regular rhythm, S1 normal and S2 normal.  Exam reveals no gallop and no friction rub.   No murmur heard. Pulmonary/Chest: Effort normal and breath sounds normal. No respiratory distress. She exhibits no tenderness.  Abdominal: Soft. Normal appearance and bowel sounds are normal. There is no hepatosplenomegaly. There is generalized tenderness. There is no rebound, no guarding, no tenderness at McBurney's point and negative Murphy's sign. No hernia.  Musculoskeletal: Normal range of motion.  Neurological: She is alert and oriented to person, place, and time. She has normal strength. No cranial nerve deficit or sensory deficit. Coordination normal. GCS eye subscore is 4. GCS verbal subscore is 5. GCS motor subscore is 6.  Skin: Skin is warm, dry and intact. No rash noted. No cyanosis.  Psychiatric: She has a normal mood and affect. Her speech is normal and behavior is normal. Thought content normal.    ED Course  Procedures (including critical care  time)  Labs Reviewed  CBC WITH DIFFERENTIAL - Abnormal; Notable for the following:    WBC 14.2 (*)    HCT 35.6 (*)    Neutro Abs 8.6 (*)    Lymphs Abs 4.5 (*)    All other components within normal limits  COMPREHENSIVE METABOLIC PANEL - Abnormal; Notable for the following:    Potassium 3.4 (*)    Total Bilirubin 0.1 (*)    All other components within normal limits  URINALYSIS, ROUTINE W REFLEX MICROSCOPIC - Abnormal; Notable for the following:    Specific Gravity, Urine >1.030 (*)    Hgb urine dipstick SMALL (*)    All other components within normal limits  URINE MICROSCOPIC-ADD ON   No results found.   Diagnosis: Abdominal pain    MDM  Patient comes to the ER for evaluation of abdominal pain. Pain is nonfocal. She describes pain up the right side of her abdomen across the top, down the left in the lower part is well. She reports that she has been treated several times recently for diverticulitis. When I review the records, her initial CAT scan did not show acute diverticulitis, she was treated empirically. Since then she has had antibiotic therapy for continued complaints of abdominal pain. She might not have had diverticulitis at all. I am not concerned for the possibility of complicated diverticulitis such as abscess or rupture at this time. I do not feel that repeating her CAT scan is necessary at this time. She did have a leukocytosis, white count 14.2. This is her normal baseline, however. No change. I do not feel she requires repeat antibiotic coverage at this time. She has followup with Dr. Karilyn Cota in one week. Treat with Bentyl.        Gilda Crease, MD 02/28/13 [phone]

## 2013-02-28 NOTE — ED Notes (Signed)
Abdominal pain onset today ?

## 2013-03-06 ENCOUNTER — Encounter (HOSPITAL_COMMUNITY)
Admission: RE | Disposition: A | Discharge status: Home / Self Care | Payer: Self-pay | Source: Ambulatory Visit | Attending: Internal Medicine

## 2013-03-06 ENCOUNTER — Encounter (HOSPITAL_COMMUNITY): Payer: Self-pay | Admitting: *Deleted

## 2013-03-06 ENCOUNTER — Ambulatory Visit (HOSPITAL_COMMUNITY)
Admission: RE | Admit: 2013-03-06 | Discharge: 2013-03-06 | Disposition: A | Discharge status: Home / Self Care | Payer: BC Managed Care – PPO | Source: Ambulatory Visit | Attending: Internal Medicine | Admitting: Internal Medicine

## 2013-03-06 DIAGNOSIS — Z8719 Personal history of other diseases of the digestive system: Secondary | ICD-10-CM

## 2013-03-06 DIAGNOSIS — K5732 Diverticulitis of large intestine without perforation or abscess without bleeding: Secondary | ICD-10-CM

## 2013-03-06 DIAGNOSIS — K5730 Diverticulosis of large intestine without perforation or abscess without bleeding: Secondary | ICD-10-CM

## 2013-03-06 DIAGNOSIS — R109 Unspecified abdominal pain: Secondary | ICD-10-CM

## 2013-03-06 DIAGNOSIS — R1032 Left lower quadrant pain: Secondary | ICD-10-CM | POA: Insufficient documentation

## 2013-03-06 HISTORY — PX: COLONOSCOPY: SHX5424

## 2013-03-06 SURGERY — COLONOSCOPY
Anesthesia: Moderate Sedation

## 2013-03-06 MED ORDER — PSYLLIUM 0.52 G PO CAPS: 0.5200 g | ORAL_CAPSULE | Freq: Every day | ORAL | Status: DC

## 2013-03-06 MED ORDER — STERILE WATER FOR IRRIGATION IR SOLN
Status: DC | PRN
  Administered 2013-03-06: 11:00:00

## 2013-03-06 MED ORDER — DICYCLOMINE HCL 10 MG PO CAPS: 10.0000 mg | ORAL_CAPSULE | Freq: Three times a day (TID) | ORAL | Status: DC

## 2013-03-06 MED ORDER — MIDAZOLAM HCL 5 MG/5ML IJ SOLN
INTRAMUSCULAR | Status: DC | PRN
  Administered 2013-03-06: 3 mg via INTRAVENOUS
  Administered 2013-03-06 (×3): 2 mg via INTRAVENOUS
  Administered 2013-03-06: 1 mg via INTRAVENOUS

## 2013-03-06 MED ORDER — MEPERIDINE HCL 50 MG/ML IJ SOLN
INTRAMUSCULAR | Status: AC
  Filled 2013-03-06: qty 1

## 2013-03-06 MED ORDER — MIDAZOLAM HCL 5 MG/5ML IJ SOLN
INTRAMUSCULAR | Status: AC
  Filled 2013-03-06: qty 10

## 2013-03-06 MED ORDER — SODIUM CHLORIDE 0.9 % IV SOLN
INTRAVENOUS | Status: DC
Start: 2013-03-06 — End: 2013-03-06
  Administered 2013-03-06: 11:00:00 via INTRAVENOUS

## 2013-03-06 MED ORDER — MEPERIDINE HCL 50 MG/ML IJ SOLN
INTRAMUSCULAR | Status: DC | PRN
  Administered 2013-03-06 (×2): 25 mg via INTRAVENOUS

## 2013-03-06 NOTE — H&P (Signed)
Rebecca Odom is an 42 y.o. female.   Chief Complaint: Patient is here for colonoscopy. HPI: Patient is 42 year old female with recurrent left-sided abdominal pain. She was felt to have diverticulitis and treated with antibiotics and medications. She is in much less pain since her third round of antibiotics. She also complains of intermittent intermittent post prandial defecation. She denies rectal bleeding. Her appetite has decreased and she has lost 15 pounds. Family she is negative for IBD or CRC.  Past Medical History  Diagnosis Date  . Bulging lumbar disc     Past Surgical History  Procedure Laterality Date  . Endometrial ablation  June 2012  . Cholecystectomy  2004    [facility]-Dr. Lovell Sheehan    Family History  Problem Relation Age of Onset  . Heart disease    . Diabetes    . Colon cancer Neg Hx   . Colon polyps Neg Hx    Social History:  reports that she has never smoked. She does not have any smokeless tobacco history on file. She reports that  drinks alcohol. She reports that she does not use illicit drugs.  Allergies:  Allergies  Allergen Reactions  . Bee Venom Shortness Of Breath and Swelling    Was prescribed epi-pen  . Vicodin (Hydrocodone-Acetaminophen) Anaphylaxis    Medications Prior to Admission  Medication Sig Dispense Refill  . gabapentin (NEURONTIN) 100 MG capsule Take 100 mg by mouth daily as needed (for pain/neuropathy).       Marland Kitchen ibuprofen (ADVIL,MOTRIN) 800 MG tablet Take 800 mg by mouth every 8 (eight) hours as needed. One every 8 hours for headache      . omeprazole (PRILOSEC) 20 MG capsule Take 20 mg by mouth 2 (two) times daily.      . peg 3350 powder (MOVIPREP) 100 G SOLR Take 1 kit (100 g total) by mouth once.  1 kit  0  . traMADol (ULTRAM) 50 MG tablet Take 2 tablets (100 mg total) by mouth every 6 (six) hours as needed for pain.  16 tablet  0  . ciprofloxacin (CIPRO) 500 MG tablet Take 500 mg by mouth 2 (two) times daily.      Marland Kitchen dicyclomine (BENTYL) 20  MG tablet Take 1 tablet (20 mg total) by mouth every 6 (six) hours as needed (abdominal pain).  20 tablet  0  . metroNIDAZOLE (FLAGYL) 500 MG tablet Take 500 mg by mouth 2 (two) times daily.        No results found for this or any previous visit (from the past 48 hour(s)). No results found.  ROS  Blood pressure 130/65, temperature 97.9 F (36.6 C), temperature source Oral, resp. rate 13, height 5\' 3"  (1.6 m), weight 236 lb (107.049 kg), SpO2 97.00%. Physical Exam  Constitutional: She is oriented to person, place, and time. She appears well-developed and well-nourished.  HENT:  Mouth/Throat: Oropharynx is clear and moist.  Eyes: Conjunctivae are normal. No scleral icterus.  Neck: No thyromegaly present.  Cardiovascular: Normal rate, regular rhythm and normal heart sounds.   No murmur heard. Respiratory: Effort normal and breath sounds normal.  GI: Soft. She exhibits no mass. There is tenderness (Mild generalized tenderness without guarding).  Musculoskeletal: She exhibits no edema.  Lymphadenopathy:    She has no cervical adenopathy.  Neurological: She is alert and oriented to person, place, and time.  Skin: Skin is warm and dry.     Assessment/Plan Recurrent left-sided abdominal pain. History of diverticulitis. Diagnostic colonoscopy.  Rebecca Odom U  03/06/2013, 10:45 AM

## 2013-03-06 NOTE — Op Note (Signed)
COLONOSCOPY PROCEDURE REPORT  PATIENT:  Rebecca Odom  MR#:  [id number] Birthdate:  1971/03/22, 42 y.o., female Endoscopist:  Dr. Malissa Hippo, MD Referred By:  Dr. Reynolds Bowl, MD Procedure Date: 03/06/2013  Procedure:   Colonoscopy  Indications: Patient is 42 year old African female who presents with recurrent left-sided abdominal pain. Abdominopelvic CT revealed diverticulosis without diverticulitis. She has experienced partial improvement in her symptomatology. She is undergoing diagnostic colonoscopy.  Informed Consent:  The procedure and risks were reviewed with the patient and informed consent was obtained.  Medications:  Demerol 50 mg IV Versed 10 mg IV  Description of procedure:  After a digital rectal exam was performed, that colonoscope was advanced from the anus through the rectum and colon to the area of the cecum, ileocecal valve and appendiceal orifice. The cecum was deeply intubated. These structures were well-seen and photographed for the record. From the level of the cecum and ileocecal valve, the scope was slowly and cautiously withdrawn. The mucosal surfaces were carefully surveyed utilizing scope tip to flexion to facilitate fold flattening as needed. The scope was pulled down into the rectum where a thorough exam including retroflexion was performed.  Findings:  Prep excellent. Few scattered diverticula noted at sigmoid colon.  No other abnormalities noted. Normal rectal mucosa and anorectal junction.    Therapeutic/Diagnostic Maneuvers Performed:  See above/ none  Complications:  None  Cecal Withdrawal Time:  8 minutes  Impression:  Few diverticula at sigmoid colon otherwise normal colonoscopy. Suspect residual/ongoing symptoms may be due to IBS.  Recommendations:  Standard instructions given. Take dicyclomine 10 mg by mouth 3 times a day rather than on as-needed basis. Metamucil 3-4 g by mouth daily. Align or equivalent 1 capsule by mouth  daily. Office visit in 2 months.   REHMAN,NAJEEB U  03/06/2013 11:51 AM  CC: Dr. Reynolds Bowl, MD & Dr. Bonnetta Barry ref. provider found

## 2013-03-07 ENCOUNTER — Encounter (HOSPITAL_COMMUNITY): Payer: Self-pay | Admitting: Internal Medicine

## 2013-03-27 ENCOUNTER — Emergency Department (HOSPITAL_COMMUNITY)
Admission: EM | Admit: 2013-03-27 | Discharge: 2013-03-27 | Disposition: A | Discharge status: Home / Self Care | Payer: BC Managed Care – PPO | Attending: Emergency Medicine | Admitting: Emergency Medicine

## 2013-03-27 ENCOUNTER — Encounter (HOSPITAL_COMMUNITY): Payer: Self-pay | Admitting: Emergency Medicine

## 2013-03-27 ENCOUNTER — Emergency Department (HOSPITAL_COMMUNITY): Payer: BC Managed Care – PPO

## 2013-03-27 DIAGNOSIS — Z9889 Other specified postprocedural states: Secondary | ICD-10-CM | POA: Insufficient documentation

## 2013-03-27 DIAGNOSIS — Z79899 Other long term (current) drug therapy: Secondary | ICD-10-CM | POA: Insufficient documentation

## 2013-03-27 DIAGNOSIS — Z8739 Personal history of other diseases of the musculoskeletal system and connective tissue: Secondary | ICD-10-CM | POA: Insufficient documentation

## 2013-03-27 DIAGNOSIS — R197 Diarrhea, unspecified: Secondary | ICD-10-CM | POA: Insufficient documentation

## 2013-03-27 DIAGNOSIS — R1033 Periumbilical pain: Secondary | ICD-10-CM | POA: Insufficient documentation

## 2013-03-27 DIAGNOSIS — K589 Irritable bowel syndrome without diarrhea: Secondary | ICD-10-CM | POA: Insufficient documentation

## 2013-03-27 DIAGNOSIS — R109 Unspecified abdominal pain: Secondary | ICD-10-CM

## 2013-03-27 DIAGNOSIS — Z9089 Acquired absence of other organs: Secondary | ICD-10-CM | POA: Insufficient documentation

## 2013-03-27 DIAGNOSIS — Z8719 Personal history of other diseases of the digestive system: Secondary | ICD-10-CM | POA: Insufficient documentation

## 2013-03-27 DIAGNOSIS — R112 Nausea with vomiting, unspecified: Secondary | ICD-10-CM | POA: Insufficient documentation

## 2013-03-27 HISTORY — DX: Irritable bowel syndrome, unspecified: K58.9

## 2013-03-27 LAB — COMPREHENSIVE METABOLIC PANEL
AST: 9 U/L (ref 0–37)
BUN: 10 mg/dL (ref 6–23)
CO2: 26 mEq/L (ref 19–32)
Calcium: 8.7 mg/dL (ref 8.4–10.5)
Chloride: 102 mEq/L (ref 96–112)
Creatinine, Ser: 0.73 mg/dL (ref 0.50–1.10)
GFR calc Af Amer: >90 mL/min (ref >90)
GFR calc non Af Amer: >90 mL/min (ref >90)
Glucose, Bld: 93 mg/dL (ref 70–99)
Total Bilirubin: 0.2 mg/dL — ABNORMAL LOW (ref 0.3–1.2)

## 2013-03-27 LAB — CBC WITH DIFFERENTIAL/PLATELET
Basophils Relative: 0 % (ref 0–1)
Eosinophils Absolute: 0.2 10*3/uL (ref 0.0–0.7)
Lymphs Abs: 4.4 10*3/uL — ABNORMAL HIGH (ref 0.7–4.0)
MCH: 27.8 pg (ref 26.0–34.0)
Neutro Abs: 5.8 10*3/uL (ref 1.7–7.7)
Neutrophils Relative %: 53 % (ref 43–77)
Platelets: 311 10*3/uL (ref 150–400)
RBC: 4.03 MIL/uL (ref 3.87–5.11)

## 2013-03-27 LAB — LIPASE, BLOOD: Lipase: 39 U/L (ref 11–59)

## 2013-03-27 MED ORDER — SODIUM CHLORIDE 0.9 % IV BOLUS (SEPSIS)
1000.0000 mL | Freq: Once | INTRAVENOUS | Status: AC
  Administered 2013-03-27: 1000 mL via INTRAVENOUS

## 2013-03-27 MED ORDER — ONDANSETRON HCL 4 MG/2ML IJ SOLN
4.0000 mg | Freq: Once | INTRAMUSCULAR | Status: AC
  Administered 2013-03-27: 4 mg via INTRAVENOUS
  Filled 2013-03-27: qty 2

## 2013-03-27 MED ORDER — FENTANYL CITRATE 0.05 MG/ML IJ SOLN
100.0000 ug | Freq: Once | INTRAMUSCULAR | Status: AC
  Administered 2013-03-27: 100 ug via INTRAVENOUS
  Filled 2013-03-27: qty 2

## 2013-03-27 MED ORDER — PROMETHAZINE HCL 25 MG PO TABS: 25.0000 mg | ORAL_TABLET | Freq: Four times a day (QID) | ORAL | Status: DC | PRN

## 2013-03-27 NOTE — ED Notes (Signed)
Patient c/o abd pain with diarrhea for x3 months. Had colonoscopy on 4/21, diagnosed with IBS. No improvement in symptoms. Started vomiting on Friday. Per patient "bile."Per patient unable to get in touch with Dr Karilyn Cota.

## 2013-03-27 NOTE — ED Provider Notes (Signed)
History     This chart was scribed for Rebecca Hutching, MD, MD by Smitty Pluck, ED Scribe. The patient was seen in room [facility]18[facility] and the patient's care was started at 7:30 AM    CSN: [id number]  Arrival date & time 03/27/13  4098      Chief Complaint  Patient presents with  . Abdominal Pain  . Emesis    (Consider location/radiation/quality/duration/timing/severity/associated sxs/prior treatment) The history is provided by the patient. No language interpreter was used.   HPI Comments: Rebecca Odom is a 42 y.o. female who presents to the Emergency Department complaining of constant, severe periumbilical abdominal pain that radiates to left lateral abdominal that has been ongoing for the past 3 months. Pt doctor Rehman (GI specialist) diagnosed pt diverticulitis and prescribed dicyclomine and daily fiber supplements but has IBS. Pt states pain has got worse with a bile taste in the mouth, loss of appetite, diarrhea ( 6-8 spells a day that is sometimes watery). Pt states intermitted nausea and vomitting. Pt denies fever, chills, weakness, cough, SOB and any other pain.      Past Medical History  Diagnosis Date  . Bulging lumbar disc   . IBS (irritable bowel syndrome)     Past Surgical History  Procedure Laterality Date  . Endometrial ablation  June 2012  . Cholecystectomy  2004    [facility]-Dr. Lovell Sheehan  . Colonoscopy N/A 03/06/2013    Procedure: COLONOSCOPY;  Surgeon: Malissa Hippo, MD;  Location: [facility];  Service: Endoscopy;  Laterality: N/A;  1030    Family History  Problem Relation Age of Onset  . Heart disease    . Diabetes    . Colon cancer Neg Hx   . Colon polyps Neg Hx     History  Substance Use Topics  . Smoking status: Never Smoker   . Smokeless tobacco: Never Used  . Alcohol Use: Yes     Comment: socially, 2 per month    OB History   Grav Para Term Preterm Abortions TAB SAB Ect Mult Living   2 2 2       2       Review of Systems 10 Systems  reviewed and all are negative for acute change except as noted in the HPI.   Allergies  Bee venom and Vicodin  Home Medications   Current Outpatient Rx  Name  Route  Sig  Dispense  Refill  . dicyclomine (BENTYL) 10 MG capsule   Oral   Take 1 capsule (10 mg total) by mouth 3 (three) times daily before meals.   90 capsule   3   . gabapentin (NEURONTIN) 100 MG capsule   Oral   Take 100 mg by mouth daily as needed (for pain/neuropathy).          Marland Kitchen ibuprofen (ADVIL,MOTRIN) 800 MG tablet   Oral   Take 800 mg by mouth every 8 (eight) hours as needed (headache). One every 8 hours for headache         . omeprazole (PRILOSEC) 20 MG capsule   Oral   Take 20 mg by mouth 2 (two) times daily.         . polycarbophil (FIBERCON) 625 MG tablet   Oral   Take 625 mg by mouth daily.         . traMADol (ULTRAM) 50 MG tablet   Oral   Take 2 tablets (100 mg total) by mouth every 6 (six) hours as needed for pain.  16 tablet   0     Triage Vitals: BP 153/93  Pulse 73  Temp(Src) 98.1 F (36.7 C) (Oral)  Resp 18  Ht 5\' 3"  (1.6 m)  Wt 237 lb (107.502 kg)  BMI 41.99 kg/m2  SpO2 99%  Physical Exam  Nursing note and vitals reviewed. Constitutional: She is oriented to person, place, and time. She appears well-developed and well-nourished.  HENT:  Head: Normocephalic and atraumatic.  Eyes: Conjunctivae and EOM are normal. Pupils are equal, round, and reactive to light.  Neck: Normal range of motion. Neck supple.  Cardiovascular: Normal rate, regular rhythm and normal heart sounds.   Pulmonary/Chest: Effort normal and breath sounds normal.  Abdominal: Soft. Bowel sounds are normal. There is tenderness in the periumbilical area.  Left lateral abdominal tenderness.  Musculoskeletal: Normal range of motion.  Neurological: She is alert and oriented to person, place, and time.  Skin: Skin is warm and dry.  Psychiatric: She has a normal mood and affect.    ED Course  Procedures  (including critical care time) DIAGNOSTIC STUDIES: Oxygen Saturation is 99% on room air, normal by my interpretation.    COORDINATION OF CARE: 7:38 AM Discussed ED treatment with pt and pt agrees.Pt is aware she will be given pain medicine and contact Dr. Karilyn Cota Medications  sodium chloride 0.9 % bolus 1,000 mL (0 mLs Intravenous Stopped 03/27/13 0936)  fentaNYL (SUBLIMAZE) injection 100 mcg (100 mcg Intravenous Given 03/27/13 0835)  ondansetron (ZOFRAN) injection 4 mg (4 mg Intravenous Given 03/27/13 0835)      Labs Reviewed  COMPREHENSIVE METABOLIC PANEL - Abnormal; Notable for the following:    Albumin 3.1 (*)    Total Bilirubin 0.2 (*)    All other components within normal limits  CBC WITH DIFFERENTIAL - Abnormal; Notable for the following:    WBC 11.0 (*)    Hemoglobin 11.2 (*)    HCT 33.9 (*)    Lymphs Abs 4.4 (*)    All other components within normal limits  LIPASE, BLOOD   Dg Abd Acute W/chest  03/27/2013  *RADIOLOGY REPORT*  Clinical Data:  Periumbilical abdominal pain radiating to left abdomen, diarrhea, nausea for 4 months, history irritable bowel syndrome  ACUTE ABDOMEN SERIES (ABDOMEN 2 VIEW & CHEST 1 VIEW)  Comparison: 07/02/2005  Findings: Minimal enlargement of cardiac silhouette. Mediastinal contours and pulmonary vascularity normal. Lungs clear. No pleural effusion or pneumothorax. Surgical clips right upper quadrant likely cholecystectomy. Nonobstructive bowel gas pattern. Normal stool in colon. No bowel dilatation, bowel wall thickening or free intraperitoneal air. Left pelvic phleboliths stable. No acute osseous findings or definite urinary tract calcification.  IMPRESSION: Enlargement of cardiac silhouette. No acute abdominal findings.   Original Report Authenticated By: Ulyses Southward, M.D.      No diagnosis found.    MDM  No acute abdomen. Patient feeling better after IV fluids and pain management.  Discussed case with the patient's gastroenterologist. He will  see her this week in followup.      I personally performed the services described in this documentation, which was scribed in my presence. The recorded information has been reviewed and is accurate.      Rebecca Hutching, MD 03/27/13 1240

## 2013-03-30 ENCOUNTER — Encounter (INDEPENDENT_AMBULATORY_CARE_PROVIDER_SITE_OTHER): Payer: Self-pay | Admitting: Internal Medicine

## 2013-03-30 ENCOUNTER — Ambulatory Visit (INDEPENDENT_AMBULATORY_CARE_PROVIDER_SITE_OTHER): Payer: BC Managed Care – PPO | Admitting: Internal Medicine

## 2013-03-30 VITALS — BP 132/84 | HR 72 | Ht 63.0 in | Wt 245.9 lb

## 2013-03-30 DIAGNOSIS — R109 Unspecified abdominal pain: Secondary | ICD-10-CM

## 2013-03-30 NOTE — Progress Notes (Signed)
Subjective:     Patient ID: Rebecca Odom, female   DOB: 01/15/1971, 42 y.o.   MRN: [id number]  HPI Seen in the ED Monday with lower abdominal pain and left sided abdominal pain. She was given IV pain meds and d/c home after evaluation.  She has had symptoms x 4 months. She continues to have suprapubic tenderness. She rates the pain at a 7/10. Appetite is okay. No weight loss. She has a BM x 3 today.  Stools were loose this am.  No rectal bleeding. She has a hx of hemorrhoids. Sometimes she has fecal urgency. Acid reflux controlled for the most part with is controlled. 03/06/2013 Colonoscopy 03/06/2013 for left lower abdominal pain: Impression:  Few diverticula at sigmoid colon otherwise normal colonoscopy.  Suspect residual/ongoing symptoms may be due to IBS.   03/27/2013 Acute abdomen: No acute findings. CBC    Component Value Date/Time   WBC 11.0* 03/27/2013 0849   RBC 4.03 03/27/2013 0849   HGB 11.2* 03/27/2013 0849   HCT 33.9* 03/27/2013 0849   PLT 311 03/27/2013 0849   MCV 84.1 03/27/2013 0849   MCH 27.8 03/27/2013 0849   MCHC 33.0 03/27/2013 0849   RDW 12.6 03/27/2013 0849   LYMPHSABS 4.4* 03/27/2013 0849   MONOABS 0.5 03/27/2013 0849   EOSABS 0.2 03/27/2013 0849   BASOSABS 0.0 03/27/2013 0849    CMP     Component Value Date/Time   NA 138 03/27/2013 0849   K 4.0 03/27/2013 0849   CL 102 03/27/2013 0849   CO2 26 03/27/2013 0849   GLUCOSE 93 03/27/2013 0849   BUN 10 03/27/2013 0849   CREATININE 0.73 03/27/2013 0849   CALCIUM 8.7 03/27/2013 0849   PROT 6.9 03/27/2013 0849   ALBUMIN 3.1* 03/27/2013 0849   AST 9 03/27/2013 0849   ALT 7 03/27/2013 0849   ALKPHOS 73 03/27/2013 0849   BILITOT 0.2* 03/27/2013 0849   GFRNONAA >90 03/27/2013 0849   GFRAA >90 03/27/2013 0849       Review of Systems     Objective:   Physical Exam  Filed Vitals:   03/30/13 0947  BP: 132/84  Pulse: 72  Height: 5\' 3"  (1.6 m)  Weight: 245 lb 14.4 oz (111.54 kg)   Alert and oriented. Skin warm and dry.  Oral mucosa is moist.   . Sclera anicteric, conjunctivae is pink. Thyroid not enlarged. No cervical lymphadenopathy. Lungs clear. Heart regular rate and rhythm.  Abdomen is soft. Bowel sounds are positive. No hepatomegaly. No abdominal masses felt. Suprpubic tenderness.  No edema to lower extremities.       Assessment:   IBS, urgency. She continues to have symptoms. Symptoms are not typical at this time of diverticulitis.     Plan:    Continue Dicyclomine up to 4 times a day. If you become constipated, may drop back to 2 times a day. Yogurt in your diet. Needs to exercise. Fiber Bars. Need f/u with gyn.

## 2013-03-30 NOTE — Patient Instructions (Addendum)
Continue the Dicyclomine . May use 4 times a day. Continue the yougart and fiber. OV in 6 months.

## 2013-04-03 ENCOUNTER — Encounter: Payer: Self-pay | Admitting: *Deleted

## 2013-04-04 ENCOUNTER — Encounter: Payer: Self-pay | Admitting: Obstetrics & Gynecology

## 2013-04-04 ENCOUNTER — Ambulatory Visit (INDEPENDENT_AMBULATORY_CARE_PROVIDER_SITE_OTHER): Payer: BC Managed Care – PPO | Admitting: Obstetrics & Gynecology

## 2013-04-04 VITALS — BP 140/96 | Ht 63.0 in | Wt 248.0 lb

## 2013-04-04 DIAGNOSIS — N949 Unspecified condition associated with female genital organs and menstrual cycle: Secondary | ICD-10-CM

## 2013-04-04 DIAGNOSIS — K589 Irritable bowel syndrome without diarrhea: Secondary | ICD-10-CM

## 2013-04-05 ENCOUNTER — Telehealth (INDEPENDENT_AMBULATORY_CARE_PROVIDER_SITE_OTHER): Payer: Self-pay | Admitting: *Deleted

## 2013-04-05 NOTE — Telephone Encounter (Signed)
Rebecca Odom would like to speak with Terri. Please return the call to [phone]. She is still having the diarrhea.

## 2013-04-05 NOTE — Telephone Encounter (Signed)
No answer at home. Cell phone was busy.

## 2013-04-13 ENCOUNTER — Ambulatory Visit (INDEPENDENT_AMBULATORY_CARE_PROVIDER_SITE_OTHER): Payer: BC Managed Care – PPO | Admitting: Obstetrics & Gynecology

## 2013-04-13 ENCOUNTER — Encounter: Payer: Self-pay | Admitting: Obstetrics & Gynecology

## 2013-04-13 ENCOUNTER — Ambulatory Visit (INDEPENDENT_AMBULATORY_CARE_PROVIDER_SITE_OTHER): Payer: BC Managed Care – PPO

## 2013-04-13 VITALS — BP 130/80 | Wt 250.0 lb

## 2013-04-13 DIAGNOSIS — K589 Irritable bowel syndrome without diarrhea: Secondary | ICD-10-CM

## 2013-04-13 DIAGNOSIS — N949 Unspecified condition associated with female genital organs and menstrual cycle: Secondary | ICD-10-CM

## 2013-04-13 NOTE — Progress Notes (Signed)
Patient ID: Rebecca Odom, female   DOB: [birth year], 42 y.o.   MRN: [id number] Sonogram reviewed and found to be non contribuotry to patient's pain. No hemtometria.  Right ovary with functional probably physiological cyst, negative scan  Probable IBS No gyn source for pain appreciated Follow up with dr Karilyn Cota as scheduled

## 2013-04-13 NOTE — Progress Notes (Signed)
Patient ID: Rebecca Odom, female   DOB: 1971-10-18, 42 y.o.   MRN: [id number] Patient with 4 month history of lower abdominal pain. She has been managed by dr Karilyn Cota and had significant work up including colonoscopy and treatment for diverticulitis empirically The patient has a significant number of runny BMs per day 6-7.  Pt has had 1 episode of spotting since her ablation.  She is sent for a gyn evaluation.  No pain with intercourse Pain is in the middle above pubis and below umbilicus  Recommend sonogram in 1 week to evaluate gyn anatomy and rule out an hematometria  Note for 5.20.2014 visit

## 2013-04-19 NOTE — Telephone Encounter (Signed)
Spoke with patient. Continue the Dicyclomine. Imodium BID. Keep OV for July

## 2013-04-19 NOTE — Telephone Encounter (Signed)
Per Rebecca Odom, she had a physical by Dr. Despina Hidden on 04/06/13 and a CT on 04/13/13. He said everything looked good and was going to send Rebecca Ar, NP a copy of his notes. She was told to f/u with Terri also. Rebecca Odom has a f/u with Dr. Karilyn Cota in July, should she be seen any earlier? The return phone number is [phone].

## 2013-05-08 ENCOUNTER — Ambulatory Visit (INDEPENDENT_AMBULATORY_CARE_PROVIDER_SITE_OTHER): Payer: BC Managed Care – PPO | Admitting: Internal Medicine

## 2013-06-12 ENCOUNTER — Encounter (INDEPENDENT_AMBULATORY_CARE_PROVIDER_SITE_OTHER): Payer: Self-pay | Admitting: Internal Medicine

## 2013-06-12 ENCOUNTER — Ambulatory Visit (INDEPENDENT_AMBULATORY_CARE_PROVIDER_SITE_OTHER): Payer: BC Managed Care – PPO | Admitting: Internal Medicine

## 2013-06-12 VITALS — BP 128/70 | HR 78 | Temp 98.5°F | Resp 18 | Ht 63.0 in | Wt 248.9 lb

## 2013-06-12 DIAGNOSIS — K589 Irritable bowel syndrome without diarrhea: Secondary | ICD-10-CM

## 2013-06-12 DIAGNOSIS — E669 Obesity, unspecified: Secondary | ICD-10-CM | POA: Insufficient documentation

## 2013-06-12 MED ORDER — DICYCLOMINE HCL 10 MG PO CAPS: 10.0000 mg | ORAL_CAPSULE | Freq: Three times a day (TID) | ORAL | Status: DC

## 2013-06-12 MED ORDER — CHOLESTYRAMINE LIGHT 4 G PO PACK: 4.0000 g | PACK | Freq: Two times a day (BID) | ORAL | Status: DC

## 2013-06-12 NOTE — Patient Instructions (Signed)
Keep stool diary as to frequency and consistency of stools for next 2-3 weeks. Hemoccults x 1

## 2013-06-12 NOTE — Progress Notes (Signed)
Presenting complaint;  Follow for diarrhea and abdominal pain.  Subjective:  Patient is 42 year old African female who is here for evaluation of her diarrhea and abdominal pain. She has had these symptoms since she had her cholecystectomy 8 years ago. She had colonoscopy in April 2014 revealing left-sided colonic diverticulosis but no other abnormalities. She has been on half of a diet, fiber supplement and dicyclomine and reports no improvement. She is having anywhere from 5-8 stools per day. Stool consistency varies from loose watery to some of formed to formed stool. She has urgency and she has had at least 5 accidents in the last 3 months. Pain is primarily located in hypogastric region and described to be cramping. She reports noticing dark stools at times and other times she wonders has blood in her stool. Her appetite is normal but she has periods when the smell of food makes her nauseated and she starts eating. She has gained about 3 pounds since her last visit 10 weeks ago. She is not having any side effects or dicyclomine and wants new prescription. She states heartlands well-controlled with therapy.  Current Medications: Current Outpatient Prescriptions  Medication Sig Dispense Refill  . dicyclomine (BENTYL) 10 MG capsule Take 1 capsule (10 mg total) by mouth 3 (three) times daily before meals.  90 capsule  3  . ibuprofen (ADVIL,MOTRIN) 800 MG tablet Take 800 mg by mouth every 8 (eight) hours as needed (headache). One every 8 hours for headache      . loperamide (IMODIUM) 2 MG capsule Take 2 mg by mouth as needed for diarrhea or loose stools.      Marland Kitchen omeprazole (PRILOSEC) 20 MG capsule Take 20 mg by mouth 2 (two) times daily.      . polycarbophil (FIBERCON) 625 MG tablet Take 625 mg by mouth daily.      . promethazine (PHENERGAN) 25 MG tablet Take 1 tablet (25 mg total) by mouth every 6 (six) hours as needed for nausea.  20 tablet  0  . traMADol (ULTRAM) 50 MG tablet Take 2 tablets (100  mg total) by mouth every 6 (six) hours as needed for pain.  16 tablet  0  . gabapentin (NEURONTIN) 100 MG capsule Take 100 mg by mouth daily as needed (for pain/neuropathy).       . promethazine (PHENERGAN) 25 MG suppository Place 1 suppository (25 mg total) rectally every 6 (six) hours as needed for nausea.  12 each  0   No current facility-administered medications for this visit.     Objective: Blood pressure 128/70, pulse 78, temperature 98.5 F (36.9 C), temperature source Oral, resp. rate 18, height 5\' 3"  (1.6 m), weight 248 lb 14.4 oz (112.9 kg). Patient is alert and in no acute distress. Conjunctiva is pink. Sclera is nonicteric Oropharyngeal mucosa is normal. No neck masses or thyromegaly noted. Cardiac exam with regular rhythm normal S1 and S2. No murmur or gallop noted. Lungs are clear to auscultation. Abdomen is full. Bowel sounds are normal. Abdomen is soft with mild tenderness at hypogastric region, RLQ and also in the epigastric region. No organomegaly or masses noted. No LE edema or clubbing noted.    Assessment:  #1. 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 She has not responded to high fiber diet and dicyclomine.    Plan:  New prescription issued for dicyclomine 10 mg 3 times a day. Cholestyramine 4 g by mouth twice a day. Patient instructed to take this medication 2 hours before  or after taking other medications. She'll keep her stools are refer to 3 weeks and send [place] a summary. Hemoccult x1 she thinks she is passing blood or has tarry stools. Will consider dropping dose of omeprazole when acute symptoms have resolved. Office visit in 3 months.

## 2013-06-30 ENCOUNTER — Telehealth: Payer: Self-pay | Admitting: Obstetrics & Gynecology

## 2013-06-30 NOTE — Telephone Encounter (Signed)
Note was in EPIC. Printed another one for pt and changed date to 06/30/13 per Dr. Despina Hidden. Pt aware to come by and pick up. JSY

## 2013-07-27 ENCOUNTER — Telehealth (INDEPENDENT_AMBULATORY_CARE_PROVIDER_SITE_OTHER): Payer: Self-pay | Admitting: *Deleted

## 2013-07-27 DIAGNOSIS — R197 Diarrhea, unspecified: Secondary | ICD-10-CM

## 2013-07-27 DIAGNOSIS — K589 Irritable bowel syndrome without diarrhea: Secondary | ICD-10-CM

## 2013-07-27 NOTE — Telephone Encounter (Signed)
Per Dr.Rehman the patient will need to have labs drawn. Patient will be made aware.

## 2013-10-02 ENCOUNTER — Encounter (INDEPENDENT_AMBULATORY_CARE_PROVIDER_SITE_OTHER): Payer: BC Managed Care – PPO | Admitting: Internal Medicine

## 2013-10-02 NOTE — Progress Notes (Signed)
Opened in error

## 2013-10-02 NOTE — Progress Notes (Deleted)
Subjective:     Patient ID: Rebecca Odom, female   DOB: 11-29-1970, 42 y.o.   MRN: [id number]  HPI Here today for f/u. She was last seen in July with diarrhea and abdominal pain.  Her last colonoscopy in April of 2014 revealing left-sided colonic diverticulosis but no other abnormalities.      Patient is 42 year old African female who is here for evaluation of her diarrhea and abdominal pain. She has had these symptoms since she had her cholecystectomy 8 years ago. She had colonoscopy in April 2014 revealing left-sided colonic diverticulosis but no other abnormalities.  She has been on half of a diet, fiber supplement and dicyclomine and reports no improvement.  She is having anywhere from 5-8 stools per day. Stool consistency varies from loose watery to some of formed to formed stool. She has urgency and she has had at least 5 accidents in the last 3 months. Pain is primarily located in hypogastric region and described to be cramping. She reports noticing dark stools at times and other times she wonders has blood in her stool. Her appetite is normal but she has periods when the smell of food makes her nauseated and she starts eating. She has gained about 3 pounds since her last visit 10 weeks ago. She is not having any side effects or dicyclomine and wants new prescription.  Review of Systems     Objective:   Physical Exam     Assessment:     ***    Plan:     ***      This encounter was created in error - please disregard. This encounter was created in error - please disregard.

## 2014-01-10 ENCOUNTER — Other Ambulatory Visit: Payer: BC Managed Care – PPO | Admitting: Obstetrics & Gynecology

## 2014-01-26 ENCOUNTER — Encounter (HOSPITAL_COMMUNITY): Payer: Self-pay | Admitting: Emergency Medicine

## 2014-01-26 ENCOUNTER — Emergency Department (HOSPITAL_COMMUNITY)
Admission: EM | Admit: 2014-01-26 | Discharge: 2014-01-26 | Disposition: A | Discharge status: Home / Self Care | Payer: BC Managed Care – PPO | Attending: Emergency Medicine | Admitting: Emergency Medicine

## 2014-01-26 ENCOUNTER — Emergency Department (HOSPITAL_COMMUNITY): Payer: BC Managed Care – PPO

## 2014-01-26 DIAGNOSIS — D259 Leiomyoma of uterus, unspecified: Secondary | ICD-10-CM | POA: Insufficient documentation

## 2014-01-26 DIAGNOSIS — G8929 Other chronic pain: Secondary | ICD-10-CM | POA: Insufficient documentation

## 2014-01-26 DIAGNOSIS — N760 Acute vaginitis: Secondary | ICD-10-CM | POA: Insufficient documentation

## 2014-01-26 DIAGNOSIS — B9689 Other specified bacterial agents as the cause of diseases classified elsewhere: Secondary | ICD-10-CM | POA: Insufficient documentation

## 2014-01-26 DIAGNOSIS — Z87442 Personal history of urinary calculi: Secondary | ICD-10-CM | POA: Insufficient documentation

## 2014-01-26 DIAGNOSIS — M545 Low back pain, unspecified: Secondary | ICD-10-CM | POA: Insufficient documentation

## 2014-01-26 DIAGNOSIS — Z3202 Encounter for pregnancy test, result negative: Secondary | ICD-10-CM | POA: Insufficient documentation

## 2014-01-26 DIAGNOSIS — K589 Irritable bowel syndrome without diarrhea: Secondary | ICD-10-CM | POA: Insufficient documentation

## 2014-01-26 DIAGNOSIS — A499 Bacterial infection, unspecified: Secondary | ICD-10-CM | POA: Insufficient documentation

## 2014-01-26 DIAGNOSIS — R102 Pelvic and perineal pain: Secondary | ICD-10-CM

## 2014-01-26 DIAGNOSIS — Z79899 Other long term (current) drug therapy: Secondary | ICD-10-CM | POA: Insufficient documentation

## 2014-01-26 DIAGNOSIS — N888 Other specified noninflammatory disorders of cervix uteri: Secondary | ICD-10-CM | POA: Insufficient documentation

## 2014-01-26 HISTORY — DX: Dorsalgia, unspecified: M54.9

## 2014-01-26 HISTORY — DX: Unspecified abdominal pain: R10.9

## 2014-01-26 HISTORY — DX: Sciatica, unspecified side: M54.30

## 2014-01-26 HISTORY — DX: Headache, unspecified: R51.9

## 2014-01-26 HISTORY — DX: Headache: R51

## 2014-01-26 HISTORY — DX: Noninfective gastroenteritis and colitis, unspecified: K52.9

## 2014-01-26 HISTORY — DX: Diverticulosis of intestine, part unspecified, without perforation or abscess without bleeding: K57.90

## 2014-01-26 HISTORY — DX: Other chronic pain: G89.29

## 2014-01-26 HISTORY — DX: Nausea: R11.0

## 2014-01-26 LAB — URINALYSIS, ROUTINE W REFLEX MICROSCOPIC
Bilirubin Urine: NEGATIVE
GLUCOSE, UA: NEGATIVE mg/dL
Hgb urine dipstick: NEGATIVE
KETONES UR: NEGATIVE mg/dL
LEUKOCYTES UA: NEGATIVE
NITRITE: NEGATIVE
PROTEIN: NEGATIVE mg/dL
Specific Gravity, Urine: 1.025 (ref 1.005–1.030)
Urobilinogen, UA: 0.2 mg/dL (ref 0.0–1.0)
pH: 5.5 (ref 5.0–8.0)

## 2014-01-26 LAB — WET PREP, GENITAL
Trich, Wet Prep: NONE SEEN
Yeast Wet Prep HPF POC: NONE SEEN

## 2014-01-26 LAB — PREGNANCY, URINE: Preg Test, Ur: NEGATIVE

## 2014-01-26 MED ORDER — NAPROXEN 250 MG PO TABS: 250.0000 mg | ORAL_TABLET | Freq: Two times a day (BID) | ORAL | Status: DC

## 2014-01-26 MED ORDER — METRONIDAZOLE 500 MG PO TABS: 500.0000 mg | ORAL_TABLET | Freq: Two times a day (BID) | ORAL | Status: DC

## 2014-01-26 MED ORDER — TRAMADOL HCL 50 MG PO TABS: 50.0000 mg | ORAL_TABLET | Freq: Four times a day (QID) | ORAL | Status: DC | PRN

## 2014-01-26 NOTE — ED Provider Notes (Signed)
CSN: [id number]     Arrival date & time 01/26/14  5277 History   First MD Initiated Contact with Patient 01/26/14 0848     Chief Complaint  Patient presents with  . Pelvic Pain      HPI Pt was seen at 0855.  Per pt, c/o gradual onset and persistence of constant lower pelvic "pain" for the past 3 days.  Has been associated with nausea. Describes the pelvic pain as "aching," with radiation into her left low back. Endorses hx of chronic loose stools and chronic abd pain "due to IBS" and denies her symptoms today are similar to her usual chronic symptoms. Denies diarrhea, no dysuria/hematuria, no vaginal bleeding/discharge, no flank pain, no rash, no CP/SOB, no black or blood in stools. Denies incont/retention of bowel or bladder, no saddle anesthesia, no focal motor weakness, no tingling/numbness in extremities, no fevers, no injury.    Past Medical History  Diagnosis Date  . Bulging lumbar disc   . IBS (irritable bowel syndrome)   . Chronic abdominal pain   . Chronic diarrhea   . Diverticulosis   . Chronic nausea   . Chronic back pain   . Sciatica   . Kidney stones   . Headache    Past Surgical History  Procedure Laterality Date  . Endometrial ablation  June 2012  . Cholecystectomy  2004    [facility]-Dr. Arnoldo Morale  . Colonoscopy N/A 03/06/2013    Procedure: COLONOSCOPY;  Surgeon: Rogene Houston, MD;  [facility];  Service: Endoscopy;  Laterality: N/A;  1030   Family History  Problem Relation Age of Onset  . Colon cancer Neg Hx   . Colon polyps Neg Hx   . Heart disease Mother     heart attack  . Cancer Father     prostate  . Heart disease Sister     CHF  . Diabetes Brother   . Cancer Brother     prostate  . Heart disease Brother   . Stroke Maternal Grandmother   . Heart disease Maternal Grandfather     massive heart attack   History  Substance Use Topics  . Smoking status: Never Smoker   . Smokeless tobacco: Never Used  . Alcohol Use: Yes     Comment:  socially, 2 per month   OB History   Grav Para Term Preterm Abortions TAB SAB Ect Mult Living   2 2 2       2      Review of Systems ROS: Statement: All systems negative except as marked or noted in the HPI; Constitutional: Negative for fever and chills. ; ; Eyes: Negative for eye pain, redness and discharge. ; ; ENMT: Negative for ear pain, hoarseness, nasal congestion, sinus pressure and sore throat. ; ; Cardiovascular: Negative for chest pain, palpitations, diaphoresis, dyspnea and peripheral edema. ; ; Respiratory: Negative for cough, wheezing and stridor. ; ; Gastrointestinal: +nausea. Negative for vomiting, diarrhea, abdominal pain, blood in stool, hematemesis, jaundice and rectal bleeding. . ; ; Genitourinary: Negative for dysuria, flank pain and hematuria. ; ; GYN:  +pelvic pain. No vaginal bleeding, no vaginal discharge, no vulvar pain.;; Musculoskeletal: +LBP. Negative for neck pain. Negative for swelling and trauma.; ; Skin: Negative for pruritus, rash, abrasions, blisters, bruising and skin lesion.; ; Neuro: Negative for headache, lightheadedness and neck stiffness. Negative for weakness, altered level of consciousness , altered mental status, extremity weakness, paresthesias, involuntary movement, seizure and syncope.        Allergies  Bee venom and Vicodin  Home Medications   Current Outpatient Rx  Name  Route  Sig  Dispense  Refill  . dicyclomine (BENTYL) 10 MG capsule   Oral   Take 1 capsule (10 mg total) by mouth 3 (three) times daily before meals.   90 capsule   5   . omeprazole (PRILOSEC) 20 MG capsule   Oral   Take 20 mg by mouth 2 (two) times daily.         . traMADol (ULTRAM) 50 MG tablet   Oral   Take 2 tablets (100 mg total) by mouth every 6 (six) hours as needed for pain.   16 tablet   0    BP 127/66  Pulse 73  Temp(Src) 97.8 F (36.6 C) (Oral)  Resp 18  Ht 5\' 3"  (1.6 m)  Wt 240 lb (108.863 kg)  BMI 42.52 kg/m2  SpO2 100% Physical  Exam 0900: Physical examination:  Nursing notes reviewed; Vital signs and O2 SAT reviewed;  Constitutional: Well developed, Well nourished, Well hydrated, In no acute distress; Head:  Normocephalic, atraumatic; Eyes: EOMI, PERRL, No scleral icterus; ENMT: Mouth and pharynx normal, Mucous membranes moist; Neck: Supple, Full range of motion, No lymphadenopathy; Cardiovascular: Regular rate and rhythm, No murmur, rub, or gallop; Respiratory: Breath sounds clear & equal bilaterally, No rales, rhonchi, wheezes.  Speaking full sentences with ease, Normal respiratory effort/excursion; Chest: Nontender, Movement normal; Abdomen: Soft, +right pelvic, left pelvic and suprapubic areas TTP. No rebound or guarding. Nondistended, Normal bowel sounds; Genitourinary: No CVA tenderness. Pelvic exam performed with permission of pt and female ED tech assist during exam.  External genitalia w/o lesions. Vaginal vault with thick white discharge.  Cervix w/o lesions, not friable, GC/chlam and wet prep obtained and sent to lab.  Bimanual exam w/o CMT, uterine or adnexal tenderness.; Spine:  No midline CS, TS, LS tenderness. +TTP left lumbar paraspinal muscles.;; Extremities: Pulses normal, No tenderness, No edema, No calf edema or asymmetry.; Neuro: AA&Ox3, Major CN grossly intact.  Speech clear. No gross focal motor or sensory deficits in extremities. Climbs on and off stretcher easily by herself. Gait steady.; Skin: Color normal, Warm, Dry.   ED Course  Procedures     EKG Interpretation None      MDM  MDM Reviewed: previous chart, nursing note and vitals Interpretation: labs, ultrasound and CT scan    Results for orders placed during the hospital encounter of 01/26/14  WET PREP, GENITAL      Result Value Ref Range   Yeast Wet Prep HPF POC NONE SEEN  NONE SEEN   Trich, Wet Prep NONE SEEN  NONE SEEN   Clue Cells Wet Prep HPF POC FEW (*) NONE SEEN   WBC, Wet Prep HPF POC FEW (*) NONE SEEN  URINALYSIS, ROUTINE W  REFLEX MICROSCOPIC      Result Value Ref Range   Color, Urine YELLOW  YELLOW   APPearance CLEAR  CLEAR   Specific Gravity, Urine 1.025  1.005 - 1.030   pH 5.5  5.0 - 8.0   Glucose, UA NEGATIVE  NEGATIVE mg/dL   Hgb urine dipstick NEGATIVE  NEGATIVE   Bilirubin Urine NEGATIVE  NEGATIVE   Ketones, ur NEGATIVE  NEGATIVE mg/dL   Protein, ur NEGATIVE  NEGATIVE mg/dL   Urobilinogen, UA 0.2  0.0 - 1.0 mg/dL   Nitrite NEGATIVE  NEGATIVE   Leukocytes, UA NEGATIVE  NEGATIVE  PREGNANCY, URINE      Result Value Ref Range  Preg Test, Ur NEGATIVE  NEGATIVE   Ct Abdomen Pelvis Wo Contrast 01/26/2014   CLINICAL DATA:  Left flank and lower abdominal pain. Irritable bowel syndrome.  EXAM: CT ABDOMEN AND PELVIS WITHOUT CONTRAST  TECHNIQUE: Multidetector CT imaging of the abdomen and pelvis was performed following the standard protocol without intravenous contrast.  COMPARISON:  DG ABD ACUTE W/CHEST dated 03/27/2013; CT ABD - PELV W/ CM dated 01/04/2013  FINDINGS: The noncontrast CT appearance of the liver, spleen, pancreas, and adrenal glands is within normal limits. Gallbladder surgically absent. Kidneys and proximal ureters unremarkable. No appendiceal inflammation observed.  No dilated bowel noted.  Scattered colonic diverticula are present  Uterine and adnexal contours unremarkable.  Degenerative facet arthropathy noted on the left at L4-5. Disc bulge and intervertebral spurring noted at L5-S1.  IMPRESSION: 1. A cause for the patient's symptoms is not identified. There are scattered colonic diverticula without inflammatory findings observed. 2. Lower lumbar spondylosis and degenerative disc disease.   Electronically Signed   By: Sherryl Barters M.D.   On: 01/26/2014 09:58   [place] Art/ven Flow Abd Pelv Doppler 01/26/2014   CLINICAL DATA:  Pain.  EXAM: TRANSABDOMINAL AND TRANSVAGINAL ULTRASOUND OF PELVIS  DOPPLER ULTRASOUND OF OVARIES  TECHNIQUE: Both transabdominal and transvaginal ultrasound examinations of the  pelvis were performed. Transabdominal technique was performed for global imaging of the pelvis including uterus, ovaries, adnexal regions, and pelvic cul-de-sac.  It was necessary to proceed with endovaginal exam following the transabdominal exam to visualize the uterus, adnexae, ovaries. Color and duplex Doppler ultrasound was utilized to evaluate blood flow to the ovaries.  COMPARISON:  None.  FINDINGS: Uterus  Measurements: 8.3 x 5.2 x 6.1 cm. Multiple small sub cm nodular densities are noted throughout the uterus consistent with fibroids. Endometrium  Thickness: 5.6 mm. Minimal amount of intrauterine fluid cannot be excluded, this may be cyclical.  Right ovary  Measurements: 3.6 x 1.6 x 2.3 cm. Normal appearance/no adnexal mass.  Left ovary  Measurements: 2.2 x 1.6 x 2.3 cm. Normal appearance/no adnexal mass.  Pulsed Doppler evaluation of both ovaries demonstrates normal low-resistance arterial and venous waveforms.  Other findings  Complex cystic regions are noted in the cervix, these are difficult to evaluate due to body habitus. MRI of the pelvis can be obtained for further evaluation. Trace free fluid.  IMPRESSION: 1. Complex cystic regions are noted in the cervix. These are difficult to evaluate due to body habitus. MRI of the pelvis can be obtained for further evaluation. 2. Multiple subcentimeter uterine fibroids. 3. Trace free pelvic fluid. 4. No evidence of ovarian mass or torsion.   Electronically Signed   By: Marcello Moores  Register   On: 01/26/2014 10:48    1055:  Workup reassuring. Will tx for BV. Pt wants to go home now "so I can eat." Pt to f/u with her OB/GYN regarding cervical cystic regions and uterine fibroids seen on [place]. Dx and testing d/w pt and family.  Questions answered.  Verb understanding, agreeable to d/c home with outpt f/u.      Alfonzo Feller, DO 01/27/14 1139

## 2014-01-26 NOTE — ED Notes (Signed)
Pt c/o pain in left flank and lower abd x 3 days.  Reports has IBS.  Reports has several loose stools a day due to IBS.  Reports nausea, no vomiting.  Denies any pain or burning with urination but reports feels like has to void but can't.   Last voided this morning around 0630.  Denies any vaginal bleeding or discharge.

## 2014-01-26 NOTE — ED Notes (Signed)
Pt received discharge instructions. Verbalized understanding and has no further questions. Pt ambulated to exit accompanied by husband.

## 2014-01-26 NOTE — Discharge Instructions (Signed)
°Emergency Department Resource Guide °1) Find a Doctor and Pay Out of Pocket °Although you won't have to find out who is covered by your insurance plan, it is a good idea to ask around and get recommendations. You will then need to call the office and see if the doctor you have chosen will accept you as a new patient and what types of options they offer for patients who are self-pay. Some doctors offer discounts or will set up payment plans for their patients who do not have insurance, but you will need to ask so you aren't surprised when you get to your appointment. ° °2) Contact Your Local Health Department °Not all health departments have doctors that can see patients for sick visits, but many do, so it is worth a call to see if yours does. If you don't know where your local health department is, you can check in your phone book. The CDC also has a tool to help you locate your state's health department, and many state websites also have listings of all of their local health departments. ° °3) Find a Walk-in Clinic °If your illness is not likely to be very severe or complicated, you may want to try a walk in clinic. These are popping up all over the country in pharmacies, drugstores, and shopping centers. They're usually staffed by nurse practitioners or physician assistants that have been trained to treat common illnesses and complaints. They're usually fairly quick and inexpensive. However, if you have serious medical issues or chronic medical problems, these are probably not your best option. ° °No Primary Care Doctor: °- Call Health Connect at  832-8000 - they can help you locate a primary care doctor that  accepts your insurance, provides certain services, etc. °- Physician Referral Service- 1-800-533-3463 ° °Chronic Pain Problems: °Organization         Address  Phone   Notes  °[facility]  ([phone]) [phone] Patients need to be referred by their primary care doctor.  ° °Medication  Assistance: °Organization         Address  Phone   Notes  °[place] [facility] [address] °[place], [place] [address] (336) 641-8030 --Must be a resident of [place] °-- Must have NO insurance coverage whatsoever (no Medicaid/ Medicare, etc.) °-- The pt. MUST have a primary care doctor that directs their care regularly and follows them in the community °  °[facility]  (866) 331-1348   °[employer]  (888) 892-1162   ° °Agencies that provide inexpensive medical care: °Organization         Address  Phone   Notes  °[facility]  (336) 832-8035   °[facility]    (336) 832-7272   °[facility] [address] °[place],  [address] (336) 832-4777   °[facility] [address], °[place] (336) 271-4999   °[facility]    (336) 373-0678   °[facility]    (336) 272-1050   °[facility] ° [address], [place] Phone:  (336) 832-4444, Fax:  (336) 832-4440 Hours of Operation:  9 am - 6 pm, M-F.  Also accepts Medicaid/Medicare and self-pay.  °[facility] ° [address], [place] Phone: (336) 832-3150, Fax: (336) [phone]. Hours of Operation:  8:30 am - 5:30 pm, M-F.  Also accepts Medicaid and self-pay.  °[facility] [place] [address], [place] Phone: (336) 878-6027   °[facility] [address], [place], [place] (336)723-1848, Ext. 123 Mondays & Thursdays: 7-9 AM.  First 15 patients are seen on a first come, first serve basis. °  ° °Medicaid-accepting [place] Providers: ° °Organization         Address  Phone   Notes  °[facility] [address], [place] (336) 641-2100 Also accepts self-pay patients.  °[facility] [address], [place] ° (336) 856-9996   °[facility] [address], [place]  (336) 288-8857   °[facility] [address] [place] [address], [place] (336) 299-7000   °Veita Bland [address], [place]  ° (336) 373-1557 Only accepts [place] Access Medicaid patients after they have their name applied to their card.  ° °Self-Pay (no insurance) in [place]: ° °Organization         Address  Phone   Notes  °Sickle Cell Patients, [facility] [address], [place] (336) 832-1970   °[facility] [address], [place] (336) 832-4400   °[facility] ° [address], [place] (336) 992-4800   °[facility]/Dr. Osei-Bonsu ° [address] [place] [address], [place] or [address], [place] (336) [phone] Phone number for both [place] and [place] locations is the same.  °[facility] [address], [place] (336) 299-0000   °[facility] [address] [place] [address], [place] or [address] (336) 852-7530 °(336) 878-2260   °[facility] [address], [place] (336) 350-1642, phone; (336) 294-5005, fax Sees patients 1st and 3rd Saturday of every month.  Must not qualify for public or private insurance (i.e. Medicaid, Medicare, [place] Health Choice, Veterans' Benefits) • Household income should be no more than 200% of the poverty level •The clinic cannot treat you if you are pregnant or think you are pregnant • Sexually transmitted diseases are not treated at the clinic.  ° ° °Dental Care: °Organization         Address  Phone  Notes  °[place] [facility] [address], [place] (336) 641-6152 Accepts children up to age 21 who are enrolled in Medicaid or [place] Health Choice; pregnant women with a Medicaid card; and children who have applied for Medicaid or [place] Health Choice, but were declined, whose parents can pay a reduced fee at time of service.  °[place]  [facility] [place]  [address], [place] (336) 641-7733 Accepts children up to age 21 who are enrolled in Medicaid or [place] Health Choice; pregnant women with a Medicaid card; and children who have applied for Medicaid or  Health Choice, but were declined, whose parents can pay a reduced fee at time of service.  °[facility] ° [address],  (336) 641-4533 Patients are seen by appointment only. Walk-ins are not accepted. [facility] will see patients 18 years of age and older. °Monday - Tuesday (8am-5pm) °Most Wednesdays (8:30-5pm) °$30 per visit, cash only  °[facility] ° [address], [place] (336) 641-4533 Patients are seen by appointment only. Walk-ins are not accepted. [facility] will see patients 18 years of age and older. °One   Wednesday Evening (Monthly: Volunteer Based).  $30 per visit, cash only  °[facility]  (919) 537-3737 for adults; Children under age 4, call Graduate Pediatric Dentistry at ([phone]) [phone]. Children aged 4-14, please call ([phone]) [phone] to request a pediatric application. ° Dental services are provided in all areas of dental care including fillings, crowns and bridges, complete and partial dentures, implants, gum treatment, root canals, and extractions. Preventive care is also provided. Treatment is provided to both adults and children. °Patients are selected via a lottery and there is often a waiting list. °  °[facility] [address], °[place] ° (336) 763-8833 [email or web address] °  °[facility] [address], [place], [place] (336)723-1848, Ext. 123 Second and Fourth Thursday of each month, opens at 6:30 AM; Clinic ends at 9 AM.  Patients are seen on a first-come first-served basis, and a limited number are seen during each clinic.  ° °[facility] ° [address], [place], [place] (336) 723-7904    Eligibility Requirements °You must have lived in [place], [place], or [place] counties for at least the last three months. °  You cannot be eligible for state or federal sponsored healthcare insurance, including Veterans Administration, Medicaid, or Medicare. °  You generally cannot be eligible for healthcare insurance through your employer.  °  How to apply: °Eligibility screenings are held every Tuesday and Wednesday afternoon from 1:00 pm until 4:00 pm. You do not need an appointment for the interview!  °[facility] [address], [place], [place] 336-631-2330   °[facility]  336-342-8273   °[facility]  336-703-3100   °[facility]  336-570-6415   ° °Behavioral Health Resources in the Community: °Intensive Outpatient Programs °Organization         Address  Phone  Notes  °[facility] [address], [place], [place] 336-878-6098   °[facility] [address], [place], [place] 336-832-9800   °[facility] [address], [place], [place] ° 336-882-2125   °[facility] [address],  °[place], [place] 1-800-853-5163 or 336-641-4981   °Substance Abuse Resources °Organization         Address  Phone  Notes  °[facility]  336-882-2125   °[facility]  336-784-9470   °[facility]  336-285-9073   °[facility]  336-845-3988   °Residential & Outpatient Substance Abuse Program  1-800-659-3381   °Psychological Services °Organization         Address  Phone  Notes  °[facility]  336- 832-9600   °[facility]  336- 378-7881   °[facility] [address], [place] 1-800-853-5163 or 336-641-4981   ° °Mobile Crisis Teams °Organization         Address  Phone  Notes  °[facility], Mobile Crisis Care Unit  1-877-626-1772   °Assertive °[facility] ° [address]  [place], [place] 336-834-9664   °Sharon DeEsch [address] °[place] 336-554-5454   ° °Self-Help/Support Groups °Organization         Address  Phone             Notes  °[facility]  - variety of support groups  336- 373-1402 Call for more information  °Narcotics Anonymous (NA), [facility] [address], °[place]  2 meetings at this location  ° °  Residential Treatment Programs Organization         Address  Phone  Notes  [facility] [address]    [place]  1-873-177-9513   [facility]  [address], [place] [address], [place], [place]   [place], [place] (458)[phone] Admissions: 8am-3pm M-F  [facility] [place] [address],    [place], [place] 681-275-1700   [facility] [address] [place], [place], [place]   [facility] [address],  [place], [place]   [facility] Intensive Outpatient [address], [place], [place]   [facility]  [place], [place] 1-(236) 245-2091 or 330-300-0820   [facility] [address], [place], [place] Accepts Medicaid  [facility] [place] [address]  [place] 1-412-434-0518 Substance Abuse/Addiction Treatment   [facility]         Address  Phone  Notes  [facility]  (719) 878-5383   Domenic Schwab, PhD [address] [place], [place]   7193158692 or 234-313-4206   [place], [place] 212-748-3500   [facility] [address], [place], [place] 786-883-2105 Insurance/Medicaid/sponsorship through [facility] [address]                                    [place], [place] [phone] [facility] [address], [place] [phone]    Dr. Adele Schilder  [phone]   [facility] Dept. 1) [address], [place] 2) [place] 3)  [address], [place] ([phone]) [phone]  [phone]   [place] (702)[phone] or [phone] (After Hours)       Take the prescriptions as directed.  Apply moist heat or ice to the area(s) of discomfort, for 15 minutes at a time, several times per day for the next few days.  Do not fall asleep on a heating or ice pack.  Your pelvic ultrasound showed uterine fibroids and cervical cysts. Call your regular OB/GYN doctor today to schedule a follow up appointment within the next week to further review these findings. Return to the Emergency Department immediately if worsening.

## 2014-01-27 LAB — GC/CHLAMYDIA PROBE AMP
CT Probe RNA: NEGATIVE
GC PROBE AMP APTIMA: NEGATIVE

## 2014-02-27 ENCOUNTER — Other Ambulatory Visit: Payer: BC Managed Care – PPO | Admitting: Obstetrics & Gynecology

## 2014-03-12 ENCOUNTER — Encounter: Payer: Self-pay | Admitting: Obstetrics & Gynecology

## 2014-03-12 ENCOUNTER — Ambulatory Visit (INDEPENDENT_AMBULATORY_CARE_PROVIDER_SITE_OTHER): Payer: BC Managed Care – PPO | Admitting: Obstetrics & Gynecology

## 2014-03-12 ENCOUNTER — Other Ambulatory Visit (HOSPITAL_COMMUNITY)
Admission: RE | Admit: 2014-03-12 | Discharge: 2014-03-12 | Disposition: A | Discharge status: Home / Self Care | Payer: BC Managed Care – PPO | Source: Ambulatory Visit | Attending: Obstetrics & Gynecology | Admitting: Obstetrics & Gynecology

## 2014-03-12 VITALS — BP 120/68 | Ht 63.0 in | Wt 249.0 lb

## 2014-03-12 DIAGNOSIS — Z1151 Encounter for screening for human papillomavirus (HPV): Secondary | ICD-10-CM | POA: Insufficient documentation

## 2014-03-12 DIAGNOSIS — R319 Hematuria, unspecified: Secondary | ICD-10-CM

## 2014-03-12 DIAGNOSIS — Z01419 Encounter for gynecological examination (general) (routine) without abnormal findings: Secondary | ICD-10-CM

## 2014-03-12 LAB — POCT URINALYSIS DIPSTICK
Glucose, UA: NEGATIVE
Ketones, UA: NEGATIVE
LEUKOCYTES UA: NEGATIVE
NITRITE UA: NEGATIVE
PROTEIN UA: NEGATIVE

## 2014-03-12 NOTE — Progress Notes (Signed)
Patient ID: Rebecca Odom, female   DOB: 04/27/1971, 43 y.o.   MRN: [id number] Subjective:     Rebecca Odom is a 43 y.o. female here for a routine exam.  No LMP recorded. Patient has had an ablation. [id number] Birth Control Method:  ablation Menstrual Calendar(currently): amenorrheic  Current complaints: diarrhea.   Current acute medical issues:  Diarrhea, Dr Laural Golden is seeing pt   Recent Gynecologic History No LMP recorded. Patient has had an ablation. Last Pap: 2014,  normal Last mammogram: 2014,  normal  Past Medical History  Diagnosis Date  . Bulging lumbar disc   . IBS (irritable bowel syndrome)   . Chronic abdominal pain   . Chronic diarrhea   . Diverticulosis   . Chronic nausea   . Chronic back pain   . Sciatica   . Kidney stones   . Headache     Past Surgical History  Procedure Laterality Date  . Endometrial ablation  June 2012  . Cholecystectomy  2004    [facility]-Dr. Arnoldo Morale  . Colonoscopy N/A 03/06/2013    Procedure: COLONOSCOPY;  Surgeon: Rogene Houston, MD;  [facility];  Service: Endoscopy;  Laterality: N/A;  65    OB History   Grav Para Term Preterm Abortions TAB SAB Ect Mult Living   2 2 2       2       History   Social History  . Marital Status: Married    Spouse Name: N/A    Number of Children: N/A  . Years of Education: N/A   Social History Main Topics  . Smoking status: Never Smoker   . Smokeless tobacco: Never Used  . Alcohol Use: Yes     Comment: socially, 2 per month  . Drug Use: No  . Sexual Activity: Yes    Birth Control/ Protection: Surgical   Other Topics Concern  . None   Social History Narrative  . None    Family History  Problem Relation Age of Onset  . Colon cancer Neg Hx   . Colon polyps Neg Hx   . Heart disease Mother     heart attack  . Alzheimer's disease Mother   . Cancer Father     prostate  . Heart disease Sister     CHF  . Diabetes Brother   . Cancer Brother     prostate  . Heart disease  Brother   . Stroke Maternal Grandmother   . Heart disease Maternal Grandfather     massive heart attack     Review of Systems  Review of Systems  Constitutional: Negative for fever, chills, weight loss, malaise/fatigue and diaphoresis.  HENT: Negative for hearing loss, ear pain, nosebleeds, congestion, sore throat, neck pain, tinnitus and ear discharge.   Eyes: Negative for blurred vision, double vision, photophobia, pain, discharge and redness.  Respiratory: Negative for cough, hemoptysis, sputum production, shortness of breath, wheezing and stridor.   Cardiovascular: Negative for chest pain, palpitations, orthopnea, claudication, leg swelling and PND.  Gastrointestinal: negative for abdominal pain. Negative for heartburn, nausea, vomiting, diarrhea, constipation, blood in stool and melena.  Genitourinary: Negative for dysuria, urgency, frequency, hematuria and flank pain.  Musculoskeletal: Negative for myalgias, back pain, joint pain and falls.  Skin: Negative for itching and rash.  Neurological: Negative for dizziness, tingling, tremors, sensory change, speech change, focal weakness, seizures, loss of consciousness, weakness and headaches.  Endo/Heme/Allergies: Negative for environmental allergies and polydipsia. Does not bruise/bleed easily.  Psychiatric/Behavioral:  Negative for depression, suicidal ideas, hallucinations, memory loss and substance abuse. The patient is not nervous/anxious and does not have insomnia.        Objective:    Physical Exam  Vitals reviewed. Constitutional: She is oriented to person, place, and time. She appears well-developed and well-nourished.  HENT:  Head: Normocephalic and atraumatic.        Right Ear: External ear normal.  Left Ear: External ear normal.  Nose: Nose normal.  Mouth/Throat: Oropharynx is clear and moist.  Eyes: Conjunctivae and EOM are normal. Pupils are equal, round, and reactive to light. Right eye exhibits no discharge. Left  eye exhibits no discharge. No scleral icterus.  Neck: Normal range of motion. Neck supple. No tracheal deviation present. No thyromegaly present.  Cardiovascular: Normal rate, regular rhythm, normal heart sounds and intact distal pulses.  Exam reveals no gallop and no friction rub.   No murmur heard. Respiratory: Effort normal and breath sounds normal. No respiratory distress. She has no wheezes. She has no rales. She exhibits no tenderness.  GI: Soft. Bowel sounds are normal. She exhibits no distension and no mass. There is no tenderness. There is no rebound and no guarding.  Genitourinary:  Breasts no masses skin changes or nipple changes bilaterally      Vulva is normal without lesions Vagina is pink moist without discharge Cervix normal in appearance and pap is done Uterus is normal size shape and contour Adnexa is negative with normal sized ovaries   Musculoskeletal: Normal range of motion. She exhibits no edema and no tenderness.  Neurological: She is alert and oriented to person, place, and time. She has normal reflexes. She displays normal reflexes. No cranial nerve deficit. She exhibits normal muscle tone. Coordination normal.  Skin: Skin is warm and dry. No rash noted. No erythema. No pallor.  Psychiatric: She has a normal mood and affect. Her behavior is normal. Judgment and thought content normal.       Assessment:    Healthy female exam.    Plan:    Mammogram ordered. Follow up in: 1 year.   Fanny cream

## 2014-09-17 ENCOUNTER — Encounter: Payer: Self-pay | Admitting: Obstetrics & Gynecology

## 2014-11-06 ENCOUNTER — Encounter: Payer: Self-pay | Admitting: Orthopedic Surgery

## 2014-11-06 ENCOUNTER — Ambulatory Visit (INDEPENDENT_AMBULATORY_CARE_PROVIDER_SITE_OTHER): Payer: BC Managed Care – PPO | Admitting: Orthopedic Surgery

## 2014-11-06 ENCOUNTER — Ambulatory Visit (INDEPENDENT_AMBULATORY_CARE_PROVIDER_SITE_OTHER): Payer: BC Managed Care – PPO

## 2014-11-06 VITALS — BP 153/85 | Ht 63.0 in | Wt 243.0 lb

## 2014-11-06 DIAGNOSIS — M542 Cervicalgia: Secondary | ICD-10-CM

## 2014-11-06 DIAGNOSIS — M47812 Spondylosis without myelopathy or radiculopathy, cervical region: Secondary | ICD-10-CM

## 2014-11-06 MED ORDER — TRAMADOL-ACETAMINOPHEN 37.5-325 MG PO TABS: 1.0000 | ORAL_TABLET | ORAL | Status: DC | PRN

## 2014-11-06 MED ORDER — METHOCARBAMOL 500 MG PO TABS: 500.0000 mg | ORAL_TABLET | Freq: Four times a day (QID) | ORAL | Status: DC | PRN

## 2014-11-06 NOTE — Progress Notes (Signed)
Patient ID: Rebecca Odom, female   DOB: 03/18/1971, 43 y.o.   MRN: [id number] Patient ID: Rebecca Odom, female   DOB: December 17, 1970, 43 y.o.   MRN: [id number]  Chief Complaint  Patient presents with  . Arm Pain    right neck pain that radiates down right arm    HPI Rebecca Odom is a 43 y.o. female.  Presents with a three-week history of upper right cervical spine pain radiating into the right shoulder associated with stiffness numbness and tingling decreased range of motion in the right shoulder. She rates her pain 8 out of 10 her pain is constant. It is relieved by Advil and naproxen in the form of Aleve on occasion.- HPI  Review of Systems Review of Systems Positive for fatigue sinus problem vision problems wheezing irregular heartbeat frequent urination diarrhea seasonal allergies joint and limb pain right shoulder stiff joints swollen joint right shoulder lightheadedness dizziness burning pain in her legs numbness and tingling upper arm.  Past Medical History  Diagnosis Date  . Bulging lumbar disc   . IBS (irritable bowel syndrome)   . Chronic abdominal pain   . Chronic diarrhea   . Diverticulosis   . Chronic nausea   . Chronic back pain   . Sciatica   . Kidney stones   . Headache     Past Surgical History  Procedure Laterality Date  . Endometrial ablation  June 2012  . Cholecystectomy  2004    [facility]-Dr. Arnoldo Morale  . Colonoscopy N/A 03/06/2013    Procedure: COLONOSCOPY;  Surgeon: Rogene Houston, MD;  [facility];  Service: Endoscopy;  Laterality: N/A;  1030    Family History  Problem Relation Age of Onset  . Colon cancer Neg Hx   . Colon polyps Neg Hx   . Heart disease Mother     heart attack  . Alzheimer's disease Mother   . Cancer Father     prostate  . Heart disease Sister     CHF  . Diabetes Brother   . Cancer Brother     prostate  . Heart disease Brother   . Stroke Maternal Grandmother   . Heart disease Maternal Grandfather     massive heart  attack    Social History History  Substance Use Topics  . Smoking status: Never Smoker   . Smokeless tobacco: Never Used  . Alcohol Use: Yes     Comment: socially, 2 per month    Allergies  Allergen Reactions  . Bee Venom Shortness Of Breath and Swelling    Was prescribed epi-pen  . Vicodin [Hydrocodone-Acetaminophen] Anaphylaxis    Current Outpatient Prescriptions  Medication Sig Dispense Refill  . dicyclomine (BENTYL) 10 MG capsule Take 1 capsule (10 mg total) by mouth 3 (three) times daily before meals. 90 capsule 5  . naproxen (NAPROSYN) 250 MG tablet Take 1 tablet (250 mg total) by mouth 2 (two) times daily with a meal. 14 tablet 0  . omeprazole (PRILOSEC) 20 MG capsule Take 20 mg by mouth 2 (two) times daily.    . methocarbamol (ROBAXIN) 500 MG tablet Take 1 tablet (500 mg total) by mouth every 6 (six) hours as needed for muscle spasms. 60 tablet 2  . traMADol (ULTRAM) 50 MG tablet Take 1 tablet (50 mg total) by mouth every 6 (six) hours as needed. (Patient not taking: Reported on 11/06/2014) 15 tablet 0  . traMADol-acetaminophen (ULTRACET) 37.5-325 MG per tablet Take 1 tablet by mouth every 4 (  four) hours as needed. 90 tablet 1   No current facility-administered medications for this visit.       Physical Exam Blood pressure 153/85, height 5\' 3"  (1.6 m), weight 243 lb (110.224 kg). Physical Exam Normal development grooming and hygiene oriented 3 mood and affect flat gait normal palpable tenderness over the base of the cervical spine with decreased range of motion negative Spurling sign  No tenderness in the right shoulder joint mild tenderness right trapezius muscle shoulder stable cuff strength is normal passive range of motion right shoulder painful but normal skin intact pulses good sensation normal reflexes 2+ and equal lymph node cervical spine axilla normal    Data Reviewed X-ray cervical spine shows cervical spondylosis at C5 and 6  Assessment     Encounter Diagnoses  Name Primary?  . Neck pain Yes  . Cervical spondylosis without myelopathy        Plan    I don't think this is a surgical issue she has cervical spondylosis recommend physical therapy for 6 weeks. Ultracet one every 4 when necessary pain #90 one refill Robaxin 500 mg every 6 when necessary #62 refills  She will make an appointment for evaluation of her right leg which is also bothering her.  She cannot take anti-inflammatories because of irritable bowel syndrome.

## 2014-11-06 NOTE — Patient Instructions (Signed)
Call [facility] therapy dept to arrange therapy  Make appt to come back for leg pain

## 2014-11-22 ENCOUNTER — Ambulatory Visit (INDEPENDENT_AMBULATORY_CARE_PROVIDER_SITE_OTHER): Payer: BC Managed Care – PPO

## 2014-11-22 ENCOUNTER — Encounter: Payer: Self-pay | Admitting: Orthopedic Surgery

## 2014-11-22 ENCOUNTER — Ambulatory Visit (HOSPITAL_COMMUNITY)
Admission: RE | Admit: 2014-11-22 | Discharge: 2014-11-22 | Disposition: A | Discharge status: Home / Self Care | Payer: BC Managed Care – PPO | Source: Ambulatory Visit | Attending: Orthopedic Surgery | Admitting: Orthopedic Surgery

## 2014-11-22 ENCOUNTER — Ambulatory Visit (INDEPENDENT_AMBULATORY_CARE_PROVIDER_SITE_OTHER): Payer: BC Managed Care – PPO | Admitting: Orthopedic Surgery

## 2014-11-22 VITALS — BP 160/88 | Ht 63.0 in | Wt 243.0 lb

## 2014-11-22 DIAGNOSIS — M542 Cervicalgia: Secondary | ICD-10-CM | POA: Insufficient documentation

## 2014-11-22 DIAGNOSIS — R202 Paresthesia of skin: Secondary | ICD-10-CM | POA: Insufficient documentation

## 2014-11-22 DIAGNOSIS — M436 Torticollis: Secondary | ICD-10-CM | POA: Diagnosis not present

## 2014-11-22 DIAGNOSIS — R262 Difficulty in walking, not elsewhere classified: Secondary | ICD-10-CM | POA: Diagnosis not present

## 2014-11-22 DIAGNOSIS — R531 Weakness: Secondary | ICD-10-CM

## 2014-11-22 DIAGNOSIS — M6289 Other specified disorders of muscle: Secondary | ICD-10-CM | POA: Diagnosis not present

## 2014-11-22 DIAGNOSIS — M546 Pain in thoracic spine: Secondary | ICD-10-CM | POA: Diagnosis not present

## 2014-11-22 DIAGNOSIS — M25561 Pain in right knee: Secondary | ICD-10-CM

## 2014-11-22 DIAGNOSIS — R200 Anesthesia of skin: Secondary | ICD-10-CM

## 2014-11-22 NOTE — Progress Notes (Signed)
Patient ID: Rebecca Odom, female   DOB: [birth year], 44 y.o.   MRN: [id number] Chief Complaint  Patient presents with  . Leg Pain    Right knee pain that radiates down leg x 1 month, no known injury   Subjective:    Rebecca Odom is a 44 y.o. female who presents with knee pain involving the right knee. Onset was sudden, starting about 1 month ago. Inciting event: none known. Current symptoms include: giving out, pain located anteromedial , stiffness and swelling. Pain is aggravated by standing. Patient has had no prior knee problems. Evaluation to date: none. Treatment to date: She will take Aleve depending on activities for the day and how her knee feels.  Past Medical History  Diagnosis Date  . Bulging lumbar disc   . IBS (irritable bowel syndrome)   . Chronic abdominal pain   . Chronic diarrhea   . Diverticulosis   . Chronic nausea   . Chronic back pain   . Sciatica   . Kidney stones   . Headache     Past Surgical History  Procedure Laterality Date  . Endometrial ablation  June 2012  . Cholecystectomy  2004    [facility]-Dr. Arnoldo Morale  . Colonoscopy N/A 03/06/2013    Procedure: COLONOSCOPY;  Surgeon: Rogene Houston, MD;  [facility];  Service: Endoscopy;  Laterality: N/A;  1030    Family History  Problem Relation Age of Onset  . Colon cancer Neg Hx   . Colon polyps Neg Hx   . Heart disease Mother     heart attack  . Alzheimer's disease Mother   . Cancer Father     prostate  . Heart disease Sister     CHF  . Diabetes Brother   . Cancer Brother     prostate  . Heart disease Brother   . Stroke Maternal Grandmother   . Heart disease Maternal Grandfather     massive heart attack    Social History History  Substance Use Topics  . Smoking status: Never Smoker   . Smokeless tobacco: Never Used  . Alcohol Use: Yes     Comment: socially, 2 per month    Allergies  Allergen Reactions  . Bee Venom Shortness Of Breath and Swelling    Was prescribed epi-pen   . Vicodin [Hydrocodone-Acetaminophen] Anaphylaxis    Current Outpatient Prescriptions  Medication Sig Dispense Refill  . dicyclomine (BENTYL) 10 MG capsule Take 1 capsule (10 mg total) by mouth 3 (three) times daily before meals. 90 capsule 5  . methocarbamol (ROBAXIN) 500 MG tablet Take 1 tablet (500 mg total) by mouth every 6 (six) hours as needed for muscle spasms. 60 tablet 2  . naproxen sodium (ANAPROX) 220 MG tablet Take 220 mg by mouth 2 (two) times daily with a meal.    . omeprazole (PRILOSEC) 20 MG capsule Take 20 mg by mouth 2 (two) times daily.    . traMADol-acetaminophen (ULTRACET) 37.5-325 MG per tablet Take 1 tablet by mouth every 4 (four) hours as needed. 90 tablet 1   No current facility-administered medications for this visit.      Review of Systems A comprehensive review of systems was negative except for: Reports fatigue, vision problems wheezing irregular heartbeat ankle pain diarrhea no back pain swelling joints as well. Diarrhea. Dizziness burning pain legs tingling numbness. Seasonal allergy.   Objective:    BP 160/88 mmHg  Ht 5\' 3"  (1.6 m)  Wt 243 lb (110.224 kg)  BMI 43.06 kg/m2 GENERAL moderately obese female  [place] place and time normal  MOODMood affect normal  UPPEREXTREMITIES: Unremarkable  Right knee: She appears have joint effusion. Abnormal medial joint line tenderness. Range of motion is 115. Flexion limited by leg size and shape. Knee stable. Motor exam normal. Skin normal. Pulses normal. Sensation normal. Reflexes 2+ equal.  Left knee:  normal and no effusion, full active range of motion, no joint line tenderness, ligamentous structures intact.   SKIN normal  CV normal  LYMPH n/a  SENSATION normal  DTR normal  COORDINATION BALANCE normal    Assessment:  Osteoarthritis right knee primarily patellofemoral and also medial I recommend a weight loss program.  Plan:   She's not taking to eat bread or pasta or carbs. She is  not to eat any sweets or unsweetened drinks. She should drink water with lemon. Goal weight of 230 pounds. Naprosyn twice a day follow-up 3 months

## 2014-11-22 NOTE — Therapy (Signed)
[place], [place], [address] Phone: [phone]   Fax:  [phone]  Physical Therapy Evaluation  Patient Details  Name: Rebecca Odom MRN: [id number] Date of Birth: August 01, 1971 Referring Provider:  Carole Civil, MD  Encounter Date: 11/22/2014      PT End of Session - 11/22/14 0856    Visit Number 1   Number of Visits 18   Date for PT Re-Evaluation 12/22/14   Authorization Type [employer]   Authorization - Visit Number 1   Authorization - Number of Visits 18   PT Start Time 0805   PT Stop Time 0845   PT Time Calculation (min) 40 min   Activity Tolerance Patient tolerated treatment well   Behavior During Therapy [facility] for tasks assessed/performed      Past Medical History  Diagnosis Date  . Bulging lumbar disc   . IBS (irritable bowel syndrome)   . Chronic abdominal pain   . Chronic diarrhea   . Diverticulosis   . Chronic nausea   . Chronic back pain   . Sciatica   . Kidney stones   . Headache     Past Surgical History  Procedure Laterality Date  . Endometrial ablation  June 2012  . Cholecystectomy  2004    [facility]-Dr. Arnoldo Morale  . Colonoscopy N/A 03/06/2013    Procedure: COLONOSCOPY;  Surgeon: Rogene Houston, MD;  [facility];  Service: Endoscopy;  Laterality: N/A;  1030    There were no vitals taken for this visit.  Visit Diagnosis:  Cervicalgia  Thoracic spine pain  Numbness and tingling of right arm and leg  Weakness of right side of body  Stiffness of cervical spine  Difficulty walking      Subjective Assessment - 11/22/14 0809    Symptoms "My arm and leg are throbbing and my arm will literally feel paralyzed."    Pertinent History Rebecca Odom is a 44 y.o. female.  Presents with a three-week history of upper right cervical spine pain radiating into the right shoulder associated with stiffness numbness and tingling decreased range of motion in the right shoulder. She  rates her pain 8 out of 10 her pain is constant. It is relieved by Advil and naproxen in the form of Aleve on occasion. Pain and weakness has progressed from neck and Rt arm into Rt leg.  "My arm feels paralyzed". Patient works in a [employer]aff[employer] and at [employer]. Patient is on her feet all day.  paion has been going on for 6 weeks. Patient has experienced head aches and vblurred vision that has progressively worsened the last 6 weeks. Patient has history of IBS secondary to removal of gall bladder. Patient has noticed increase use in restroom "I have to pee more often. "   How long can you stand comfortably? "I have to force myself to stand, i can sit down if i need to, but i have to stand all day for work. 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 <11minutes   Currently in Pain? Yes   Pain Score 8    Pain Location Neck   Pain Orientation Right   Pain Descriptors / Indicators Tingling;Numbness;Aching;Sore;Stabbing;Shooting   Pain Onset More than a month ago   Pain Frequency Constant   Aggravating Factors  Standing, walking,    Pain Relieving Factors tylenol and pain medication.  [facility] PT Assessment - 11/22/14 0001    Assessment   Medical Diagnosis Rt sided neck pain with radiating pain and weakness in Rt LE and UE.    Onset Date 10/11/14   Next MD Visit Aline Brochure, today   Prior Therapy no   Balance Screen   Has the patient fallen in the past 6 months No   Has the patient had a decrease in activity level because of a fear of falling?  No   Is the patient reluctant to leave their home because of a fear of falling?  No   Prior Function   Level of Independence Independent with basic ADLs   Cognition   Overall Cognitive Status Within Functional Limits for tasks assessed   Functional Tests   Functional tests Sit to Stand;Other;Other2   Sit to Stand   Comments WNL   Other:   Other/ Comments Gait: excessive  bilateral toe in Lt > Rt, klimited ability to toe out, decreased balance with cross over gait, limited hip sway and limited trunk rotation.    Other:   Other/Comments 3D hip excursions: klimited Frontal plane and Transverse plane excursion to Rt.    AROM   Overall AROM Comments Joint mobility : limited throughtou thoracic spine and limited C5 down glide.    Cervical Flexion 38   Cervical Extension 68, but painful   Cervical - Right Side Bend 43   Cervical - Left Side Bend 31   Cervical - Right Rotation 50   Cervical - Left Rotation 75   Lumbar Flexion 83   Lumbar Extension 40   Strength   Overall Strength Comments Rt sided weakness in Rt compared to Left  in UE and LE with Rt beeinggenerally one muscle grade lower (4/5MMT compared to 5/5)                           PT Education - 11/22/14 1000    Education provided Yes   Education Details 3D thoracic spine excursions for HEP.    Person(s) Educated Patient   Methods Explanation;Demonstration;Handout   Comprehension Verbalized understanding;Returned demonstration          PT Short Term Goals - 11/22/14 0935    PT SHORT TERM GOAL #1   Title Patient will be able to side bend cervical spine 45 degrees bilaterally   Baseline 43Rt, 31 Lt degrees   Time 3   Period Weeks   Status New   PT SHORT TERM GOAL #2   Title patient will be able to rotate cervical spine 60 degrees bilaterally.    Baseline 50Rt, 70Lt   Time 3   Period Weeks   Status New   PT SHORT TERM GOAL #3   Title Patient will demonstrate independence with HEP   Time 3   Period Weeks   Status New   PT SHORT TERM GOAL #4   Title Patient will state pain in neck decreased to <5/10 with prolonged standing >33minutes   PT SHORT TERM GOAL #5   Title Patient will ddemosntrate >15 degrees of horizontal abduction   Baseline 10 degrees on Lt 0 degrees on Rt           PT Long Term Goals - 11/22/14 0941    PT LONG TERM GOAL #1   Title patient will be  able to rotate cervical spine 70 degrees bilaterally.    Time 6   Period Weeks   Status New  PT LONG TERM GOAL #2   Title Patient will demonstrate independence with advanced HEP   Time 6   Period Weeks   Status New   PT LONG TERM GOAL #3   Title patient will demonstrate hip internal and external rotation >30 degrees bilaterallyto be able to walk with toes in a neutral position   Baseline not formally measured, but limitted in gait with toes poited in.    Time 6   Period Weeks   Status New   PT LONG TERM GOAL #4   Title Patient will be bale to stand >3 hours with pain numbness and tingling <3/10 and note decreased buckling of knee   Time 8   Period Weeks   Status New               Plan - 11/22/14 0857    Clinical Impression Statement Patient dispalsys NEck pain with radiating numbness, tingling, weakness and pain in Rt UE and LE. This is attributed to limited C5 joint mobility with down glides resulting in limited ability to rotate head to Rt and side bend cervical spine to right. In addition to this patient displays  limited thoracic spine extension resulting in an excessive forward slumped posture with signs and symptoms consistent with a horasis spine bulging disc pathology. Other contributing factor includ abnorma gait mechanicns secondary to limited hip external rotation, forward rounded shoulders attributed to pectoral muscle tightness. Patient will benefit form skille dphsyical therapy to increase cervical spine and thoracic spine joint mobility, increase hip and whshoulder mobility and trunk and shoulder strengthening to improve postruer and ability to perform prolonged standing with good posture and decrease pain and numbness throughtou RT UE and LE.    Pt will benefit from skilled therapeutic intervention in order to improve on the following deficits Abnormal gait;Decreased endurance;Increased muscle spasms;Improper body mechanics;Decreased activity tolerance;Decreased  strength;Impaired flexibility;Difficulty walking;Decreased mobility;Decreased balance;Pain;Decreased range of motion;Increased fascial restricitons;Impaired UE functional use   PT Frequency 3x / week   PT Duration 6 weeks   PT Treatment/Interventions Gait training;Traction;Neuromuscular re-education;Stair training;Passive range of motion;Patient/family education;Functional mobility training;Therapeutic activities   PT Next Visit Plan Begin with manual techniues to decrease cervical ant thoracic spine mobility limitations, introduce 3 way Pectoral stretch, introduce Upper trapezius and Levator stretches, intorduce standing uiniliateral  rows.   PT Home Exercise Plan 3D thoracic spine excursion         Problem List Patient Active Problem List   Diagnosis Date Noted  . IBS (irritable bowel syndrome) 06/12/2013  . Obesity 06/12/2013  . Diverticulitis of colon (without mention of hemorrhage) 01/26/2013  . Abdominal  pain, other specified site 01/12/2013  . Disc prolapse 04/20/2012  . Sciatica 01/25/2012    Devona Konig PT DPT [place], [place], [address] Phone: [phone]   Fax:  [phone]

## 2014-11-22 NOTE — Patient Instructions (Signed)
Goal weight in 3 months 230lbs  Naproxen twice daily every day

## 2014-11-26 ENCOUNTER — Ambulatory Visit: Payer: BC Managed Care – PPO | Admitting: Orthopedic Surgery

## 2014-11-27 ENCOUNTER — Telehealth (HOSPITAL_COMMUNITY): Payer: Self-pay

## 2014-11-27 ENCOUNTER — Ambulatory Visit (HOSPITAL_COMMUNITY): Payer: BC Managed Care – PPO

## 2014-11-27 NOTE — Telephone Encounter (Signed)
She has an emergency at work and can not come in today

## 2014-11-28 ENCOUNTER — Ambulatory Visit (HOSPITAL_COMMUNITY)
Admission: RE | Admit: 2014-11-28 | Discharge: 2014-11-28 | Disposition: A | Discharge status: Home / Self Care | Payer: BC Managed Care – PPO | Source: Ambulatory Visit | Attending: Orthopedic Surgery | Admitting: Orthopedic Surgery

## 2014-11-28 DIAGNOSIS — M436 Torticollis: Secondary | ICD-10-CM

## 2014-11-28 DIAGNOSIS — M542 Cervicalgia: Secondary | ICD-10-CM

## 2014-11-28 DIAGNOSIS — R531 Weakness: Secondary | ICD-10-CM

## 2014-11-28 DIAGNOSIS — R200 Anesthesia of skin: Secondary | ICD-10-CM

## 2014-11-28 DIAGNOSIS — R202 Paresthesia of skin: Secondary | ICD-10-CM

## 2014-11-28 DIAGNOSIS — M546 Pain in thoracic spine: Secondary | ICD-10-CM

## 2014-11-28 DIAGNOSIS — R262 Difficulty in walking, not elsewhere classified: Secondary | ICD-10-CM

## 2014-11-28 NOTE — Patient Instructions (Signed)
Flexibility: Upper Trapezius Stretch   Gently grasp right side of head while reaching behind back with other hand. Tilt head away until a gentle stretch is felt. Hold 30 seconds. Repeat 3  times per set. Do 3 sets per session. Do 1-2 sessions per day.  http://orth.exer.us/341   Copyright  VHI. All rights reserved.   Levator Scapula Stretch, Sitting   Sit, one hand tucked under hip on side to be stretched, other hand over top of head. Turn head toward other side and look down. Use hand on head to gently stretch neck in that position. Hold 30 seconds. Repeat 3 times per session. Do 1-2 sessions per day.  Copyright  VHI. All rights reserved.

## 2014-11-28 NOTE — Therapy (Signed)
[place], [place], [address] Phone: [phone]   Fax:  [phone]  Physical Therapy Treatment  Patient Details  Name: Rebecca Odom MRN: [id number] Date of Birth: Mar 15, 1971 Referring Provider:  Carole Civil, MD  Encounter Date: 11/28/2014      PT End of Session - 11/28/14 1606    Visit Number 2   Number of Visits 18   Date for PT Re-Evaluation 12/22/14   Authorization Type BCBS   Authorization - Visit Number 2   Authorization - Number of Visits 18   PT Start Time 1524   PT Stop Time 1600   PT Time Calculation (min) 36 min   Activity Tolerance Patient tolerated treatment well   Behavior During Therapy [facility] for tasks assessed/performed      Past Medical History  Diagnosis Date  . Bulging lumbar disc   . IBS (irritable bowel syndrome)   . Chronic abdominal pain   . Chronic diarrhea   . Diverticulosis   . Chronic nausea   . Chronic back pain   . Sciatica   . Kidney stones   . Headache     Past Surgical History  Procedure Laterality Date  . Endometrial ablation  June 2012  . Cholecystectomy  2004    [facility]-Dr. Arnoldo Morale  . Colonoscopy N/A 03/06/2013    Procedure: COLONOSCOPY;  Surgeon: Rogene Houston, MD;  [facility];  Service: Endoscopy;  Laterality: N/A;  1030    There were no vitals taken for this visit.  Visit Diagnosis:  Cervicalgia  Thoracic spine pain  Numbness and tingling of right arm and leg  Weakness of right side of body  Stiffness of cervical spine  Difficulty walking      Subjective Assessment - 11/28/14 1525    Symptoms PT went to MD last week with reports of arthritis in neck and knees.  Pt stated she has stopped drinking all sodas since last Thursday, drinking lots of water and juice.  Pain scale 7/10 Left side of neck   Currently in Pain? Yes   Pain Score 7    Pain Location Neck   Pain Orientation Left                    [facility]  Adult PT Treatment/Exercise - 11/28/14 1533    Exercises   Exercises Neck   Neck Exercises: Stretches   Upper Trapezius Stretch 3 reps;30 seconds   Levator Stretch 3 reps;30 seconds   Corner Stretch 3 reps;30 seconds   Corner Stretch Limitations high, medium and low   Neck Exercises: Theraband   Rows 10 reps;Green   Rows Limitations unilateral with minisquats   Neck Exercises: Seated   Other Seated Exercise 3D cervical and thoracic excursion   Manual Therapy   Manual Therapy Other (comment)   Other Manual Therapy Manual position release Bil                  PT Short Term Goals - 11/28/14 1650    PT SHORT TERM GOAL #1   Title Patient will be able to side bend cervical spine 45 degrees bilaterally   Status On-going   PT SHORT TERM GOAL #2   Title patient will be able to rotate cervical spine 60 degrees bilaterally.    Status On-going   PT SHORT TERM GOAL #3   Title Patient will demonstrate independence with HEP   Status On-going   PT SHORT  TERM GOAL #4   Title Patient will state pain in neck decreased to <5/10 with prolonged standing >63minutes   Status On-going   PT SHORT TERM GOAL #5   Title Patient will ddemosntrate >15 degrees of horizontal abduction           PT Long Term Goals - 11/28/14 1650    PT LONG TERM GOAL #1   Title patient will be able to rotate cervical spine 70 degrees bilaterally.    PT LONG TERM GOAL #2   Title Patient will demonstrate independence with advanced HEP   PT LONG TERM GOAL #3   Title patient will demonstrate hip internal and external rotation >30 degrees bilaterallyto be able to walk with toes in a neutral position   PT LONG TERM GOAL #4   Title Patient will be bale to stand >3 hours with pain numbness and tingling <3/10 and note decreased buckling of knee               Plan - 11/28/14 1606    Clinical Impression Statement Began PT POC educating proper posture landmarks and explaining importance of posture for pain  control.   Instructed stretches of upper traps, levator scapula and pectoralis musculature, given HEP printout for stretches.  Began with cerivcal and thoracic excursion exercises to improve spinal mobility.  End of sessoin manual position release complete to reduce pain and improve cervical AROM.  Pt reported pain reduced at end iof session.  Pt encouraged to stay hyrdated to reduce risk of headaches following manual.     PT Next Visit Plan Continue manual techniques to decrease cervical and thoracic spine mobility limitations, continue pectoral stretches, upper traps and levaot stretches, postureal strengthening exercises with unilateral rows.  Progress as able.        Problem List Patient Active Problem List   Diagnosis Date Noted  . IBS (irritable bowel syndrome) 06/12/2013  . Obesity 06/12/2013  . Diverticulitis of colon (without mention of hemorrhage) 01/26/2013  . Abdominal  pain, other specified site 01/12/2013  . Disc prolapse 04/20/2012  . Sciatica 01/25/2012   Ihor Austin, [place]  Aldona Lento 11/28/2014, 6:47 PM  [place] [address] [place], [place], [address] Phone: [phone]   Fax:  [phone]

## 2014-11-29 ENCOUNTER — Ambulatory Visit (HOSPITAL_COMMUNITY)
Admission: RE | Admit: 2014-11-29 | Discharge: 2014-11-29 | Disposition: A | Discharge status: Home / Self Care | Payer: BC Managed Care – PPO | Source: Ambulatory Visit | Attending: Orthopedic Surgery | Admitting: Orthopedic Surgery

## 2014-11-29 DIAGNOSIS — R531 Weakness: Secondary | ICD-10-CM

## 2014-11-29 DIAGNOSIS — R200 Anesthesia of skin: Secondary | ICD-10-CM

## 2014-11-29 DIAGNOSIS — M546 Pain in thoracic spine: Secondary | ICD-10-CM

## 2014-11-29 DIAGNOSIS — M436 Torticollis: Secondary | ICD-10-CM

## 2014-11-29 DIAGNOSIS — R202 Paresthesia of skin: Secondary | ICD-10-CM

## 2014-11-29 DIAGNOSIS — R262 Difficulty in walking, not elsewhere classified: Secondary | ICD-10-CM

## 2014-11-29 DIAGNOSIS — M542 Cervicalgia: Secondary | ICD-10-CM

## 2014-11-29 NOTE — Therapy (Signed)
[place], [place], [address] Phone: [phone]   Fax:  [phone]  Physical Therapy Treatment  Patient Details  Name: ALLIS QUIRARTE MRN: [id number] Date of Birth: [birth year] Referring Provider:  Carole Civil, MD  Encounter Date: 11/29/2014      PT End of Session - 11/29/14 1447    Visit Number 3   Number of Visits 18   Date for PT Re-Evaluation 12/22/14   Authorization Type BCBS   Authorization - Visit Number 3   Authorization - Number of Visits 18   PT Start Time 4431   PT Stop Time 1513   PT Time Calculation (min) 53 min   Activity Tolerance Patient tolerated treatment well   Behavior During Therapy [facility] for tasks assessed/performed      Past Medical History  Diagnosis Date  . Bulging lumbar disc   . IBS (irritable bowel syndrome)   . Chronic abdominal pain   . Chronic diarrhea   . Diverticulosis   . Chronic nausea   . Chronic back pain   . Sciatica   . Kidney stones   . Headache     Past Surgical History  Procedure Laterality Date  . Endometrial ablation  June 2012  . Cholecystectomy  2004    [facility]-Dr. Arnoldo Morale  . Colonoscopy N/A 03/06/2013    Procedure: COLONOSCOPY;  Surgeon: Rogene Houston, MD;  [facility];  Service: Endoscopy;  Laterality: N/A;  1030    There were no vitals taken for this visit.  Visit Diagnosis:  Cervicalgia  Thoracic spine pain  Numbness and tingling of right arm and leg  Weakness of right side of body  Stiffness of cervical spine  Difficulty walking      Subjective Assessment - 11/29/14 1419    Symptoms Pt stated she feels very nauseated today, pain scale 6/10 Lt side of neck.  Reports compliance with HEP   Currently in Pain? Yes   Pain Score 6    Pain Location Neck   Pain Orientation Left   Pain Descriptors / Indicators Sore          [facility] PT Assessment - 11/29/14 0001    Assessment   Medical Diagnosis Rt sided neck pain  with radiating pain and weakness in Rt LE and UE.    Onset Date 10/11/14   Next MD Visit Aline Brochure, 02/21/2015   Prior Therapy no                  [facility] Adult PT Treatment/Exercise - 11/29/14 1422    Exercises   Exercises Neck   Neck Exercises: Stretches   Upper Trapezius Stretch 3 reps;30 seconds   Levator Stretch 3 reps;30 seconds   Corner Stretch 3 reps;30 seconds   Corner Stretch Limitations high, medium and low   Neck Exercises: Theraband   Scapula Retraction 10 reps;Green   Shoulder Extension 10 reps;Green   Rows 10 reps;Green   Rows Limitations unilateral with minisquats and Bilateral 2 sets   Neck Exercises: Standing   Other Standing Exercises UE overhead matrix 10x each   Neck Exercises: Seated   W Back 10 reps   Shoulder Rolls Backwards;10 reps   Other Seated Exercise 3D cervical and thoracic excursion   Other Seated Exercise Bil rows for scapula strengthening   Manual Therapy   Manual Therapy Other (comment)   Other Manual Therapy Manual position release with STM to Bil upper trap in supine with LE  elevated.                  PT Short Term Goals - 11/29/14 1515    PT SHORT TERM GOAL #1   Title Patient will be able to side bend cervical spine 45 degrees bilaterally   Status On-going   PT SHORT TERM GOAL #2   Title patient will be able to rotate cervical spine 60 degrees bilaterally.    Status On-going   PT SHORT TERM GOAL #3   Title Patient will demonstrate independence with HEP   Status On-going   PT SHORT TERM GOAL #4   Title Patient will state pain in neck decreased to <5/10 with prolonged standing >49minutes   Status On-going   PT SHORT TERM GOAL #5   Title Patient will ddemosntrate >15 degrees of horizontal abduction   Status On-going           PT Long Term Goals - 11/29/14 1516    PT LONG TERM GOAL #1   Title patient will be able to rotate cervical spine 70 degrees bilaterally.    PT LONG TERM GOAL #2   Title Patient will  demonstrate independence with advanced HEP   PT LONG TERM GOAL #3   Title patient will demonstrate hip internal and external rotation >30 degrees bilaterallyto be able to walk with toes in a neutral position   PT LONG TERM GOAL #4   Title Patient will be bale to stand >3 hours with pain numbness and tingling <3/10 and note decreased buckling of knee               Plan - 11/29/14 1448    Clinical Impression Statement Began session with cervical and thoraccic mobility exercises, stretches to improve flexibility and  and education on importance of posture  Pt presentes with posture with forward head and foward rolled shoulder.  Progress posture strengthing exercises with cervical and scapular retraction and tband exercises  to improve strengthning.  Manual position release complete for Lt upper traps and STM complete to Bil upper traps to reduce spasms and pain.  Pt encouraged to stay hydrated to reduce  pain.   PT Next Visit Plan Continue manual techniques to decrease cervical and thoracic spine mobility limitations, continue pectoral stretches, upper traps and levaot stretches, postureal strengthening exercises with unilateral rows.  Progress as able.        Problem List Patient Active Problem List   Diagnosis Date Noted  . IBS (irritable bowel syndrome) 06/12/2013  . Obesity 06/12/2013  . Diverticulitis of colon (without mention of hemorrhage) 01/26/2013  . Abdominal  pain, other specified site 01/12/2013  . Disc prolapse 04/20/2012  . Sciatica 01/25/2012   Ihor Austin, [place]   Aldona Lento 11/29/2014, 3:20 PM  [facility] [address] [place], [place], [address] Phone: [phone]   Fax:  [phone]

## 2014-12-04 ENCOUNTER — Telehealth: Payer: Self-pay | Admitting: Orthopedic Surgery

## 2014-12-04 NOTE — Telephone Encounter (Signed)
SHE NEEDS TO TALK TO HER PRIMARY CARE DOCTOR TO RECOMMEND A PAIN MED  SHES ALLERGIC TO ANY THING ELSE I CAN GIVE HER

## 2014-12-04 NOTE — Telephone Encounter (Signed)
Patient is complaining of intense right knee throbbing pain, knee is swollen and stiff Naproxen not helping, she cant bend her knee, Please Advise, her follow up appointment is not until April 2016, please advise?

## 2014-12-05 NOTE — Telephone Encounter (Signed)
Called patient, no answer 

## 2014-12-05 NOTE — Telephone Encounter (Signed)
PATIENT AWARE, STATES SHE STILL HAS SOME ULTRACET AND WILL SEE IF THAT HELPS

## 2014-12-06 ENCOUNTER — Encounter (HOSPITAL_COMMUNITY): Payer: BC Managed Care – PPO | Admitting: Physical Therapy

## 2014-12-07 ENCOUNTER — Ambulatory Visit (HOSPITAL_COMMUNITY): Payer: BC Managed Care – PPO

## 2014-12-11 ENCOUNTER — Ambulatory Visit (HOSPITAL_COMMUNITY): Payer: BC Managed Care – PPO | Admitting: Physical Therapy

## 2014-12-13 ENCOUNTER — Ambulatory Visit (HOSPITAL_COMMUNITY): Payer: BC Managed Care – PPO

## 2014-12-14 ENCOUNTER — Ambulatory Visit (HOSPITAL_COMMUNITY)
Admission: RE | Admit: 2014-12-14 | Discharge: 2014-12-14 | Disposition: A | Discharge status: Home / Self Care | Payer: BC Managed Care – PPO | Source: Ambulatory Visit | Attending: Orthopedic Surgery | Admitting: Orthopedic Surgery

## 2014-12-14 DIAGNOSIS — M436 Torticollis: Secondary | ICD-10-CM

## 2014-12-14 DIAGNOSIS — R531 Weakness: Secondary | ICD-10-CM

## 2014-12-14 DIAGNOSIS — R262 Difficulty in walking, not elsewhere classified: Secondary | ICD-10-CM

## 2014-12-14 DIAGNOSIS — R200 Anesthesia of skin: Secondary | ICD-10-CM

## 2014-12-14 DIAGNOSIS — M542 Cervicalgia: Secondary | ICD-10-CM | POA: Diagnosis not present

## 2014-12-14 DIAGNOSIS — M546 Pain in thoracic spine: Secondary | ICD-10-CM

## 2014-12-14 DIAGNOSIS — R202 Paresthesia of skin: Secondary | ICD-10-CM

## 2014-12-14 NOTE — Therapy (Signed)
[place], [place], [address] Phone: [phone]   Fax:  [phone]  Physical Therapy Treatment  Patient Details  Name: Rebecca Odom MRN: [id number] Date of Birth: December 27, 1970 Referring Provider:  Carole Civil, MD  Encounter Date: 12/14/2014      PT End of Session - 12/14/14 1528    Visit Number 4   Number of Visits 18   Date for PT Re-Evaluation 12/22/14   Authorization Type BCBS   Authorization - Visit Number 4   Authorization - Number of Visits 18   PT Start Time 9741   PT Stop Time 1515   PT Time Calculation (min) 40 min   Activity Tolerance Patient tolerated treatment well   Behavior During Therapy [facility] for tasks assessed/performed      Past Medical History  Diagnosis Date  . Bulging lumbar disc   . IBS (irritable bowel syndrome)   . Chronic abdominal pain   . Chronic diarrhea   . Diverticulosis   . Chronic nausea   . Chronic back pain   . Sciatica   . Kidney stones   . Headache     Past Surgical History  Procedure Laterality Date  . Endometrial ablation  June 2012  . Cholecystectomy  2004    [facility]-Dr. Arnoldo Morale  . Colonoscopy N/A 03/06/2013    Procedure: COLONOSCOPY;  Surgeon: Rogene Houston, MD;  [facility];  Service: Endoscopy;  Laterality: N/A;  1030    There were no vitals taken for this visit.  Visit Diagnosis:  Cervicalgia  Thoracic spine pain  Numbness and tingling of right arm and leg  Weakness of right side of body  Stiffness of cervical spine  Difficulty walking      Subjective Assessment - 12/14/14 1522    Symptoms Minimal neck pain (1/10) upon arrival notes increased knee pain (7/10). Patient states she would like t have Physical therapy evaluate and teeat her knee.    Currently in Pain? Yes   Pain Score 1    Pain Location Neck                    [facility] Adult PT Treatment/Exercise - 12/14/14 0001    Neck Exercises: Stretches   Upper Trapezius Stretch 3 reps;30 seconds   Levator Stretch 3 reps;30 seconds   Corner Stretch 3 reps;30 seconds   Corner Stretch Limitations high, medium and low   Other Neck Stretches posterior shoulder stretch 2x 20 seconds   Neck Exercises: Theraband   Other Theraband Exercises 3D dumbbell pendulum with 1lb dumbbell   Other Theraband Exercises Overhead dumbbell matrix 5x 3lb each UE.    Manual Therapy   Manual Therapy Joint mobilization   Joint Mobilization up and down glides bilaterally throughout cervial spine resuktling in increased neck AROM    Other Manual Therapy Manual position release with STM to Bil upper trap in supine with LE elevated.                  PT Short Term Goals - 11/29/14 1515    PT SHORT TERM GOAL #1   Title Patient will be able to side bend cervical spine 45 degrees bilaterally   Status On-going   PT SHORT TERM GOAL #2   Title patient will be able to rotate cervical spine 60 degrees bilaterally.    Status On-going   PT SHORT TERM GOAL #3   Title Patient will demonstrate independence with  HEP   Status On-going   PT SHORT TERM GOAL #4   Title Patient will state pain in neck decreased to <5/10 with prolonged standing >60minutes   Status On-going   PT SHORT TERM GOAL #5   Title Patient will ddemosntrate >15 degrees of horizontal abduction   Status On-going           PT Long Term Goals - 11/29/14 1516    PT LONG TERM GOAL #1   Title patient will be able to rotate cervical spine 70 degrees bilaterally.    PT LONG TERM GOAL #2   Title Patient will demonstrate independence with advanced HEP   PT LONG TERM GOAL #3   Title patient will demonstrate hip internal and external rotation >30 degrees bilaterallyto be able to walk with toes in a neutral position   PT LONG TERM GOAL #4   Title Patient will be bale to stand >3 hours with pain numbness and tingling <3/10 and note decreased buckling of knee               Plan - 12/14/14 1529     Clinical Impression Statement Session continued focus on increasing cervical spine AROm through manual techniques. No pain in nck present at end of session. Patient stated performance of HEP. Patient's other complain of knee pain appears unrelated to her neck symptoms but appear to be attributed to hip, knee and anke stiffness.    PT Next Visit Plan Continue manual techniques to decrease cervical and thoracic spine mobility limitations, continue pectoral stretches, upper traps and levaot stretches, postureal strengthening exercises with unilateral rows.  Progress as able.        Problem List Patient Active Problem List   Diagnosis Date Noted  . IBS (irritable bowel syndrome) 06/12/2013  . Obesity 06/12/2013  . Diverticulitis of colon (without mention of hemorrhage) 01/26/2013  . Abdominal  pain, other specified site 01/12/2013  . Disc prolapse 04/20/2012  . Sciatica 01/25/2012   Devona Konig PT DPT [place], [place], [address] Phone: [phone]   Fax:  [phone]

## 2014-12-19 ENCOUNTER — Ambulatory Visit (HOSPITAL_COMMUNITY)
Admission: RE | Admit: 2014-12-19 | Discharge: 2014-12-19 | Disposition: A | Discharge status: Home / Self Care | Payer: BC Managed Care – PPO | Source: Ambulatory Visit | Attending: Orthopedic Surgery | Admitting: Orthopedic Surgery

## 2014-12-19 DIAGNOSIS — R262 Difficulty in walking, not elsewhere classified: Secondary | ICD-10-CM | POA: Diagnosis not present

## 2014-12-19 DIAGNOSIS — M6289 Other specified disorders of muscle: Secondary | ICD-10-CM | POA: Diagnosis not present

## 2014-12-19 DIAGNOSIS — M546 Pain in thoracic spine: Secondary | ICD-10-CM | POA: Diagnosis not present

## 2014-12-19 DIAGNOSIS — R202 Paresthesia of skin: Secondary | ICD-10-CM | POA: Insufficient documentation

## 2014-12-19 DIAGNOSIS — M436 Torticollis: Secondary | ICD-10-CM | POA: Diagnosis not present

## 2014-12-19 DIAGNOSIS — R200 Anesthesia of skin: Secondary | ICD-10-CM

## 2014-12-19 DIAGNOSIS — R531 Weakness: Secondary | ICD-10-CM

## 2014-12-19 DIAGNOSIS — M542 Cervicalgia: Secondary | ICD-10-CM | POA: Diagnosis not present

## 2014-12-19 NOTE — Therapy (Addendum)
[place], [place], [address] Phone: [phone]   Fax:  [phone]  Physical Therapy Treatment  Patient Details  Name: Rebecca Odom MRN: [id number] Date of Birth: [birth year] Referring Provider:  Carole Civil, MD  Encounter Date: 12/19/2014      PT End of Session - 12/19/14 1435    Visit Number 5   Number of Visits 18   Date for PT Re-Evaluation 12/22/14   Authorization Type BCBS   Authorization - Visit Number 5   Authorization - Number of Visits 10   PT Start Time 5277   PT Stop Time 1436   PT Time Calculation (min) 43 min   Activity Tolerance Patient tolerated treatment well   Behavior During Therapy [facility] for tasks assessed/performed      Past Medical History  Diagnosis Date  . Bulging lumbar disc   . IBS (irritable bowel syndrome)   . Chronic abdominal pain   . Chronic diarrhea   . Diverticulosis   . Chronic nausea   . Chronic back pain   . Sciatica   . Kidney stones   . Headache     Past Surgical History  Procedure Laterality Date  . Endometrial ablation  June 2012  . Cholecystectomy  2004    [facility]-Dr. Arnoldo Morale  . Colonoscopy N/A 03/06/2013    Procedure: COLONOSCOPY;  Surgeon: Rogene Houston, MD;  [facility];  Service: Endoscopy;  Laterality: N/A;  1030    There were no vitals taken for this visit.  Visit Diagnosis:  Cervicalgia  Thoracic spine pain  Numbness and tingling of right arm and leg  Weakness of right side of body  Stiffness of cervical spine  Difficulty walking      Subjective Assessment - 12/19/14 1358    Symptoms Pt stated neck is feeling good today, Rt shoulder pain scale 6/10.  Rt knee pain scale 7/10, pt with new referral for PT for knee.   Currently in Pain? Yes   Pain Score 6    Pain Location Shoulder   Pain Orientation Right   Pain Descriptors / Indicators Sharp   Multiple Pain Sites Yes   Pain Score 7   Pain Location Knee   Pain  Orientation Right   Pain Descriptors / Indicators Tightness  Tightness, stiffness          [facility] PT Assessment - 12/19/14 0001    Assessment   Medical Diagnosis Rt sided neck pain with radiating pain and weakness in Rt LE and UE.    Onset Date 10/11/14   Next MD Visit Aline Brochure, 02/21/2015   Prior Therapy no   Functional Tests   Functional tests Other  Gait asssessment per Devona Konig DPT- excessive Bil LE IR   Functional Gait  Assessment   Gait assessed  --                  [facility] Adult PT Treatment/Exercise - 12/19/14 1405    Exercises   Exercises Neck;Knee/Hip   Neck Exercises: Stretches   Upper Trapezius Stretch 3 reps;30 seconds   Levator Stretch 3 reps;30 seconds   Corner Stretch 3 reps;30 seconds   Corner Stretch Limitations high, medium and low   Other Neck Stretches anterior shoulder stretch 3x 20"   Neck Exercises: Theraband   Other Theraband Exercises 3D dumbbell pendulum with 1lb dumbbell   Other Theraband Exercises Overhead dumbbell matrix 5x 3lb each UE.    Knee/Hip Exercises:  Stretches   Active Hamstring Stretch 3 reps;30 seconds   Active Hamstring Stretch Limitations 8in step   Hip Flexor Stretch 3 reps;30 seconds   Hip Flexor Stretch Limitations 8in step with ER   Gastroc Stretch 3 reps;30 seconds   Gastroc Stretch Limitations slant board   Knee/Hip Exercises: Standing   Gait Training anterior lateral walk with UE opposite direction 2RT                  PT Short Term Goals - 12/19/14 1523    PT SHORT TERM GOAL #1   Title Patient will be able to side bend cervical spine 45 degrees bilaterally   Status On-going   PT SHORT TERM GOAL #2   Title patient will be able to rotate cervical spine 60 degrees bilaterally.    Status On-going   PT SHORT TERM GOAL #3   Title Patient will demonstrate independence with HEP   Status On-going   PT SHORT TERM GOAL #4   Title Patient will state pain in neck decreased to <5/10 with prolonged standing  >42minutes   Status On-going   PT SHORT TERM GOAL #5   Title Patient will ddemosntrate >15 degrees of horizontal abduction   Status On-going           PT Long Term Goals - 12/19/14 1523    PT LONG TERM GOAL #1   Title patient will be able to rotate cervical spine 70 degrees bilaterally.    Status On-going   PT LONG TERM GOAL #2   Title Patient will demonstrate independence with advanced HEP   PT LONG TERM GOAL #3   Title patient will demonstrate hip internal and external rotation >30 degrees bilaterallyto be able to walk with toes in a neutral position   Status On-going   PT LONG TERM GOAL #4   Title Patient will be able to stand >3 hours with pain numbness and tingling <3/10 and note decreased buckling of knee   Status On-going               Plan - 12/19/14 1436    Clinical Impression Statement Received referral from MD for Rt knee.  Gait assessed for Rt knee by DPT during session with the following findings:  excessive Bil LE IR with increased strain to medial aspect of knee.  Added stretches per DPT to improve flexibilty and pt given HEP worksheet with stretches.  Pt reported knee pain reduced following stretches and gait activities with improved ease and form with standing exercises.  Continued session foucs on increasing cervical and thoracic spinal AROM with stretches and mobility exercises.  Reports Rt shouilder pain reduced at end of session.     PT Next Visit Plan Evaluate Rt knee per referral next session.  Continue manual techniques to decrease cervical and thoracic spine mobility limitations, continue pectoral stretches, upper traps and levaot stretches, postureal strengthening exercises with unilateral rows.  Progress as able.        Problem List Patient Active Problem List   Diagnosis Date Noted  . IBS (irritable bowel syndrome) 06/12/2013  . Obesity 06/12/2013  . Diverticulitis of colon (without mention of hemorrhage) 01/26/2013  . Abdominal  pain, other  specified site 01/12/2013  . Disc prolapse 04/20/2012  . Sciatica 01/25/2012   Ihor Austin, [place]  Aldona Lento 12/19/2014, 3:25 PM  [place] [facility] [address] [place], [place], [address] Phone: [phone]   Fax:  [phone]  Patient's gait observed by PT with PTA directed to treat via LE stretches given this session, further evaluation/assessment of knee pain to be completed next session by PT.   Devona Konig PT DPT [phone]

## 2014-12-19 NOTE — Addendum Note (Signed)
Encounter addended by: Susy Frizzle, PTA on: 12/19/2014  3:43 PM<BR>     Documentation filed: Follow-up Section, Clinical Notes

## 2014-12-20 NOTE — Addendum Note (Signed)
Encounter addended by: Leia Alf, PT on: 12/20/2014  9:24 AM<BR>     Documentation filed: Clinical Notes

## 2014-12-21 ENCOUNTER — Ambulatory Visit (HOSPITAL_COMMUNITY)
Admission: RE | Admit: 2014-12-21 | Discharge: 2014-12-21 | Disposition: A | Discharge status: Home / Self Care | Payer: BC Managed Care – PPO | Source: Ambulatory Visit | Attending: Orthopedic Surgery | Admitting: Orthopedic Surgery

## 2014-12-21 DIAGNOSIS — R202 Paresthesia of skin: Secondary | ICD-10-CM

## 2014-12-21 DIAGNOSIS — M25661 Stiffness of right knee, not elsewhere classified: Secondary | ICD-10-CM

## 2014-12-21 DIAGNOSIS — R29898 Other symptoms and signs involving the musculoskeletal system: Secondary | ICD-10-CM

## 2014-12-21 DIAGNOSIS — M546 Pain in thoracic spine: Secondary | ICD-10-CM

## 2014-12-21 DIAGNOSIS — R200 Anesthesia of skin: Secondary | ICD-10-CM

## 2014-12-21 DIAGNOSIS — M542 Cervicalgia: Secondary | ICD-10-CM

## 2014-12-21 DIAGNOSIS — R262 Difficulty in walking, not elsewhere classified: Secondary | ICD-10-CM

## 2014-12-21 DIAGNOSIS — M436 Torticollis: Secondary | ICD-10-CM

## 2014-12-21 DIAGNOSIS — R531 Weakness: Secondary | ICD-10-CM

## 2014-12-21 DIAGNOSIS — M25561 Pain in right knee: Secondary | ICD-10-CM

## 2014-12-21 NOTE — Therapy (Signed)
[place], [place], [address] Phone: [phone]   Fax:  [phone]  Physical Therapy Evaluation  Patient Details  Name: Rebecca Odom MRN: [id number] Date of Birth: 02-24-1971 Referring Provider:  Carole Civil, MD  Encounter Date: 12/21/2014      PT End of Session - 12/21/14 1430    Visit Number 6   Number of Visits 18   Date for PT Re-Evaluation 12/22/14   Authorization Type BC[employer]   Authorization - Visit Number 6   Authorization - Number of Visits 18      Past Medical History  Diagnosis Date  . Bulging lumbar disc   . IBS (irritable bowel syndrome)   . Chronic abdominal pain   . Chronic diarrhea   . Diverticulosis   . Chronic nausea   . Chronic back pain   . Sciatica   . Kidney stones   . Headache     Past Surgical History  Procedure Laterality Date  . Endometrial ablation  June 2012  . Cholecystectomy  2004    [facility]-Dr. Arnoldo Morale  . Colonoscopy N/A 03/06/2013    Procedure: COLONOSCOPY;  Surgeon: Rogene Houston, MD;  [facility];  Service: Endoscopy;  Laterality: N/A;  1030    There were no vitals taken for this visit.  Visit Diagnosis:  Cervicalgia  Thoracic spine pain  Numbness and tingling of right arm and leg  Weakness of right side of body  Stiffness of cervical spine  Difficulty walking  Right anterior knee pain  Weakness of right hip  Stiffness of knee joint, right      Subjective Assessment - 12/21/14 1400    Symptoms Rt knee pain.    Pertinent History Rt knee pain as of 11/07/15. with knee pain continuing. patient was only able to partial weight bear 2 months ago but has had improving pain and ROM.  Patient used to drag Rt leg while walking but has had improvements recently while having functional phsyical therapy for neck and shoulder.    How long can you stand comfortably? 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 <45minutes   Currently in Pain? Yes   Pain Score 6    Pain Location Knee   Pain Orientation Right   Pain Descriptors / Indicators Aching   Pain Type Chronic pain   Pain Onset More than a month ago   Pain Frequency Constant          [facility] PT Assessment - 12/21/14 0001    Assessment   Medical Diagnosis Rt knee pain and stiffness resulting in difficulty walking,    Onset Date 10/07/14   Next MD Visit Aline Brochure, 02/21/2015   Prior Therapy no   Balance Screen   Has the patient fallen in the past 6 months No   Has the patient had a decrease in activity level because of a fear of falling?  No   Is the patient reluctant to leave their home because of a fear of falling?  No   Prior Function   Level of Independence Independent with basic ADLs   Sit to Stand   Comments WNL, painful   Other:   Other/Comments 3D hip excursions: klimited Frontal plane and Transverse plane excursion to Rt.  AROM   Right Hip Extension 13   Right Hip External Rotation  40   Right Hip Internal Rotation  45   Left Hip Extension 15   Right Knee Extension 0   Right Knee Flexion 95   Left Knee Extension 0   Left Knee Flexion 110   Right Ankle Dorsiflexion -3   Left Ankle Dorsiflexion 8   Strength   Right Hip Flexion 4/5   Right Hip Extension 4/5   Right Hip ABduction 3+/5   Left Hip Flexion 5/5   Left Hip Extension 4/5   Left Hip ABduction 4/5   Right Knee Flexion 4-/5   Right Knee Extension 4-/5   Left Knee Flexion 5/5   Left Knee Extension 5/5   Right Ankle Dorsiflexion 5/5   Left Ankle Dorsiflexion 5/5   Flexibility   Soft Tissue Assessment /Muscle Lenght yes   Hamstrings MINIMALLY LIMITED   Quadriceps Ely's test: 95 degrees (Rt)  and 100 degrees (Lt)   ITB Rt Obers test positive                  [facility] Adult PT Treatment/Exercise - 12/21/14 0001    Knee/Hip Exercises:  Stretches   Quad Stretch 4 reps;20 seconds   ITB Stretch 4 reps;20 seconds                PT Education - 12/21/14 1428    Education Details ITBand and Quadriceps stretch          PT Short Term Goals - 12/21/14 2010    PT SHORT TERM GOAL #1   Title Patient will be able to side bend cervical spine 45 degrees bilaterally   Time 3   Period Weeks   Status On-going   PT SHORT TERM GOAL #2   Title patient will be able to rotate cervical spine 60 degrees bilaterally.    Time 3   Period Weeks   Status On-going   PT SHORT TERM GOAL #3   Title Patient will demonstrate independence with HEP   Time 3   Period Weeks   Status On-going   PT SHORT TERM GOAL #4   Title Patient will state pain in neck decreased to <5/10 with prolonged standing >50minutes   Time 3   Period Weeks   Status On-going   PT SHORT TERM GOAL #5   Title Patient will demosntrate >15 degrees of horizontal abduction   Baseline 10 degrees on Lt 0 degrees on Rt   Period Weeks   Status On-going   Additional Short Term Goals   Additional Short Term Goals Yes   PT SHORT TERM GOAL #6   Title patient will dmeosntrate increased Knee flexion to 110 degrees to more easily squat to low chair   Baseline 95 degrees   Time 4   Period Weeks   Status New   PT SHORT TERM GOAL #7   Title Patient will dmeonstrate increased dorsiflexion of 8 degrees on R to improve deceleration mechanics during gait   Baseline -3   Time 4   Period Weeks   Status New   PT SHORT TERM GOAL #8   Title Patient will dmeonstrate a negative obers test indicating mproved lateral hip flexibility for improved decerleation mechanics during gait.    Time 4   Period Weeks   Status New           PT Long Term Goals - 12/21/14 2013    PT LONG TERM GOAL #1  Title patient will be able to rotate cervical spine 70 degrees bilaterally.    Time 6   Period Weeks   Status On-going   PT LONG TERM GOAL #2   Title Patient will demonstrate  independence with advanced HEP   Time 6   Period Weeks   Status New   PT LONG TERM GOAL #3   Title patient will demonstrate hip internal and external rotation >30 degrees bilaterallyto be able to walk with toes in a neutral position   Baseline not formally measured, but limitted in gait with toes poited in.    Time 6   Period Weeks   Status On-going   PT LONG TERM GOAL #4   Title Patient will be able to stand >3 hours with pain numbness and tingling <3/10 and note decreased buckling of knee   Time 8   Period Weeks   Status On-going   PT LONG TERM GOAL #5   Title Patient will dmeonstrate increased hamstring strength on Rt of 5/5 MMT to improve knee stability.    Baseline 4-/5   Time 8   Period Weeks   Status New   Additional Long Term Goals   Additional Long Term Goals Yes   PT LONG TERM GOAL #6   Title Patient will dmeonstrate increased hip abduction strength of 4/5 MMTon Rt to improve trendelenber gait and decrease strain on knee.    Baseline 3/5   Time 8   Period Weeks   Status New               Plan - 12/21/14 2007    Clinical Impression Statement Patient displays Rt knee pain secondary to limited hamstring and glut strength and limited quadriceps and knee mobility resulting in excessive loading of quadriceps muscles durign gait and sqautting placing excessive strain on anterior knee. Patient will benefit from skilled phsyical therapy to decrease knee pain by improvign loaing and lifting mechnaics in addition to  continued treatment of cervical spine  to decrease pain so patient can return to work withtou pain. Marland Kitchen    PT Next Visit Plan Continue manual techniques to decrease cervical and thoracic spine mobility limitations, continue pectoral stretches, upper traps and levaot stretches, postureal strengthening exercises with unilateral rows.  Introduce quadriceps stretches and hamstring/glut strengtheng exercises to decrease strain on anterior knee.         Problem  List Patient Active Problem List   Diagnosis Date Noted  . IBS (irritable bowel syndrome) 06/12/2013  . Obesity 06/12/2013  . Diverticulitis of colon (without mention of hemorrhage) 01/26/2013  . Abdominal  pain, other specified site 01/12/2013  . Disc prolapse 04/20/2012  . Sciatica 01/25/2012   Devona Konig PT DPT [place], [place], [address] Phone: [phone]   Fax:  [phone]

## 2014-12-21 NOTE — Patient Instructions (Signed)
Flexion: Stretch - Quadriceps (Prone)   Position Helper: Place one hand on shin near left ankle. Stabilize buttock to keep hip flat on bed. Motion - Helper presses heel toward buttock slowly. CAUTION: Patient should feel stretch along front of thigh. Hold 20 . Repeat 4 times. Repeat with other leg. Do 2 sessions per day. V

## 2014-12-25 ENCOUNTER — Ambulatory Visit (HOSPITAL_COMMUNITY): Payer: BC Managed Care – PPO | Admitting: Physical Therapy

## 2014-12-26 ENCOUNTER — Telehealth (HOSPITAL_COMMUNITY): Payer: Self-pay

## 2014-12-26 NOTE — Telephone Encounter (Signed)
2/10 pt called on 2/9 and left a message to cancel appointment but to call her back to reschedule.  I called back but it said her voice mail box was full.

## 2014-12-27 ENCOUNTER — Ambulatory Visit (HOSPITAL_COMMUNITY)
Admission: RE | Admit: 2014-12-27 | Discharge: 2014-12-27 | Disposition: A | Discharge status: Home / Self Care | Payer: BC Managed Care – PPO | Source: Ambulatory Visit | Attending: Orthopedic Surgery | Admitting: Orthopedic Surgery

## 2014-12-27 DIAGNOSIS — M546 Pain in thoracic spine: Secondary | ICD-10-CM

## 2014-12-27 DIAGNOSIS — M436 Torticollis: Secondary | ICD-10-CM

## 2014-12-27 DIAGNOSIS — M25561 Pain in right knee: Secondary | ICD-10-CM

## 2014-12-27 DIAGNOSIS — R531 Weakness: Secondary | ICD-10-CM

## 2014-12-27 DIAGNOSIS — R29898 Other symptoms and signs involving the musculoskeletal system: Secondary | ICD-10-CM

## 2014-12-27 DIAGNOSIS — M25661 Stiffness of right knee, not elsewhere classified: Secondary | ICD-10-CM

## 2014-12-27 DIAGNOSIS — R262 Difficulty in walking, not elsewhere classified: Secondary | ICD-10-CM

## 2014-12-27 DIAGNOSIS — M542 Cervicalgia: Secondary | ICD-10-CM

## 2014-12-27 NOTE — Therapy (Signed)
[place], [place], [address] Phone: [phone]   Fax:  [phone]  Physical Therapy Treatment  Patient Details  Name: Rebecca Odom MRN: [id number] Date of Birth: [birth year] Referring Provider:  Carole Civil, MD  Encounter Date: 12/27/2014      PT End of Session - 12/27/14 1414    Visit Number 7   Number of Visits 18   Date for PT Re-Evaluation 12/22/14   Authorization Type [employer]   Authorization - Visit Number 7   Authorization - Number of Visits 18   PT Start Time 1062   PT Stop Time 1430   PT Time Calculation (min) 39 min   Activity Tolerance Patient tolerated treatment well   Behavior During Therapy [facility] for tasks assessed/performed      Past Medical History  Diagnosis Date  . Bulging lumbar disc   . IBS (irritable bowel syndrome)   . Chronic abdominal pain   . Chronic diarrhea   . Diverticulosis   . Chronic nausea   . Chronic back pain   . Sciatica   . Kidney stones   . Headache     Past Surgical History  Procedure Laterality Date  . Endometrial ablation  June 2012  . Cholecystectomy  2004    [facility]-Dr. Arnoldo Morale  . Colonoscopy N/A 03/06/2013    Procedure: COLONOSCOPY;  Surgeon: Rogene Houston, MD;  [facility];  Service: Endoscopy;  Laterality: N/A;  1030    There were no vitals taken for this visit.  Visit Diagnosis:  Cervicalgia  Thoracic spine pain  Weakness of right side of body  Stiffness of cervical spine  Difficulty walking  Right anterior knee pain  Weakness of right hip  Stiffness of knee joint, right      Subjective Assessment - 12/27/14 1359    Symptoms Patient states recent change in job responsibilities resulting in increased pain and soreness in Rt shoulder/neck adn Rt knee pain.    Currently in Pain? Yes   Pain Score 6    Pain Location Shoulder   Pain Orientation Right   Pain Descriptors / Indicators Aching                     [facility] Adult PT Treatment/Exercise - 12/27/14 0001    Neck Exercises: Theraband   Other Theraband Exercises 3D dumbbell pendulum with 1lb dumbbell   Neck Exercises: Standing   Other Standing Exercises --   Other Standing Exercises unilateral rows blue tband 2x 10   Knee/Hip Exercises: Stretches   Active Hamstring Stretch 3 reps;30 seconds   Active Hamstring Stretch Limitations 8in step   Quad Stretch 4 reps;20 seconds   Hip Flexor Stretch 3 reps;30 seconds   Hip Flexor Stretch Limitations 8in step with ER   ITB Stretch 4 reps;20 seconds   Gastroc Stretch 3 reps;30 seconds   Gastroc Stretch Limitations slant board   Knee/Hip Exercises: Standing   Forward Lunges 10 reps   Forward Lunges Limitations to 6" box with ankle high reach, VC for hel loading.    Other Standing Knee Exercises same side pick up and reach 5lb 10x   Manual Therapy   Other Manual Therapy anterior to posterial grade 3 joint mobilization tibia-femoral joint   Neck Exercises: Stretches   Chest Stretch Limitations   Other Neck Stretches Anteriosr shoudler stretch 3x 20 seconds, pectoral stretch 3 way 10x 3 seconds. posterior shoulder stretch 10x 3"  PT Short Term Goals - 12/21/14 2010    PT SHORT TERM GOAL #1   Title Patient will be able to side bend cervical spine 45 degrees bilaterally   Time 3   Period Weeks   Status On-going   PT SHORT TERM GOAL #2   Title patient will be able to rotate cervical spine 60 degrees bilaterally.    Time 3   Period Weeks   Status On-going   PT SHORT TERM GOAL #3   Title Patient will demonstrate independence with HEP   Time 3   Period Weeks   Status On-going   PT SHORT TERM GOAL #4   Title Patient will state pain in neck decreased to <5/10 with prolonged standing >2minutes   Time 3   Period Weeks   Status On-going   PT SHORT TERM GOAL #5   Title Patient will demosntrate >15 degrees of horizontal abduction   Baseline 10 degrees on Lt 0  degrees on Rt   Period Weeks   Status On-going   Additional Short Term Goals   Additional Short Term Goals Yes   PT SHORT TERM GOAL #6   Title patient will dmeosntrate increased Knee flexion to 110 degrees to more easily squat to low chair   Baseline 95 degrees   Time 4   Period Weeks   Status New   PT SHORT TERM GOAL #7   Title Patient will dmeonstrate increased dorsiflexion of 8 degrees on R to improve deceleration mechanics during gait   Baseline -3   Time 4   Period Weeks   Status New   PT SHORT TERM GOAL #8   Title Patient will dmeonstrate a negative obers test indicating mproved lateral hip flexibility for improved decerleation mechanics during gait.    Time 4   Period Weeks   Status New           PT Long Term Goals - 12/21/14 2013    PT LONG TERM GOAL #1   Title patient will be able to rotate cervical spine 70 degrees bilaterally.    Time 6   Period Weeks   Status On-going   PT LONG TERM GOAL #2   Title Patient will demonstrate independence with advanced HEP   Time 6   Period Weeks   Status New   PT LONG TERM GOAL #3   Title patient will demonstrate hip internal and external rotation >30 degrees bilaterallyto be able to walk with toes in a neutral position   Baseline not formally measured, but limitted in gait with toes poited in.    Time 6   Period Weeks   Status On-going   PT LONG TERM GOAL #4   Title Patient will be able to stand >3 hours with pain numbness and tingling <3/10 and note decreased buckling of knee   Time 8   Period Weeks   Status On-going   PT LONG TERM GOAL #5   Title Patient will dmeonstrate increased hamstring strength on Rt of 5/5 MMT to improve knee stability.    Baseline 4-/5   Time 8   Period Weeks   Status New   Additional Long Term Goals   Additional Long Term Goals Yes   PT LONG TERM GOAL #6   Title Patient will dmeonstrate increased hip abduction strength of 4/5 MMTon Rt to improve trendelenber gait and decrease strain on  knee.    Baseline 3/5   Time 8   Period Weeks   Status New  Plan - 12/27/14 1417    Clinical Impression Statement Patient arrives with increased knee pain secondary to a change in job responsibilities that place increased strain on knee and neck. Patient performed flexibility exercises for pain relief and increased rom to normalize gait. patient noted decreased pain at end of session.    PT Next Visit Plan Continue manual techniques to decrease cervical and thoracic spine mobility limitations, continue pectoral stretches, upper traps and levaot stretches, postureal strengthening exercises with unilateral rows.  Introduce quadriceps stretches and hamstring/glut strengtheng exercises to decrease strain on anterior knee.         Problem List Patient Active Problem List   Diagnosis Date Noted  . IBS (irritable bowel syndrome) 06/12/2013  . Obesity 06/12/2013  . Diverticulitis of colon (without mention of hemorrhage) 01/26/2013  . Abdominal  pain, other specified site 01/12/2013  . Disc prolapse 04/20/2012  . Sciatica 01/25/2012    Devona Konig PT DPT [place], [place], [address] Phone: [phone]   Fax:  [phone]

## 2015-01-03 ENCOUNTER — Ambulatory Visit (HOSPITAL_COMMUNITY): Payer: BC Managed Care – PPO

## 2015-01-04 ENCOUNTER — Ambulatory Visit (HOSPITAL_COMMUNITY): Payer: BC Managed Care – PPO | Admitting: Physical Therapy

## 2015-01-11 ENCOUNTER — Ambulatory Visit (HOSPITAL_COMMUNITY): Payer: BC Managed Care – PPO

## 2015-01-11 DIAGNOSIS — M436 Torticollis: Secondary | ICD-10-CM

## 2015-01-11 DIAGNOSIS — R202 Paresthesia of skin: Secondary | ICD-10-CM

## 2015-01-11 DIAGNOSIS — M25661 Stiffness of right knee, not elsewhere classified: Secondary | ICD-10-CM

## 2015-01-11 DIAGNOSIS — M542 Cervicalgia: Secondary | ICD-10-CM

## 2015-01-11 DIAGNOSIS — R531 Weakness: Secondary | ICD-10-CM

## 2015-01-11 DIAGNOSIS — R29898 Other symptoms and signs involving the musculoskeletal system: Secondary | ICD-10-CM

## 2015-01-11 DIAGNOSIS — M25561 Pain in right knee: Secondary | ICD-10-CM

## 2015-01-11 DIAGNOSIS — R200 Anesthesia of skin: Secondary | ICD-10-CM

## 2015-01-11 DIAGNOSIS — R262 Difficulty in walking, not elsewhere classified: Secondary | ICD-10-CM

## 2015-01-11 DIAGNOSIS — M546 Pain in thoracic spine: Secondary | ICD-10-CM

## 2015-01-11 NOTE — Therapy (Addendum)
[place], [place], [address] Phone: [phone]   Fax:  [phone]  Physical Therapy Treatment  Patient Details  Name: Rebecca Odom MRN: [id number] Date of Birth: 1971-03-18 Referring Provider:  Jacqulyn Bath,*  Encounter Date: 01/11/2015      PT End of Session - 01/11/15 1439    Visit Number 8   Number of Visits 18   Date for PT Re-Evaluation 01/21/15   Authorization Type BCBS   Authorization - Visit Number 8   Authorization - Number of Visits 18   PT Start Time 1324   PT Stop Time 1515   PT Time Calculation (min) 50 min   Activity Tolerance Patient tolerated treatment well   Behavior During Therapy [facility] for tasks assessed/performed      Past Medical History  Diagnosis Date  . Bulging lumbar disc   . IBS (irritable bowel syndrome)   . Chronic abdominal pain   . Chronic diarrhea   . Diverticulosis   . Chronic nausea   . Chronic back pain   . Sciatica   . Kidney stones   . Headache     Past Surgical History  Procedure Laterality Date  . Endometrial ablation  June 2012  . Cholecystectomy  2004    [facility]-Dr. Arnoldo Morale  . Colonoscopy N/A 03/06/2013    Procedure: COLONOSCOPY;  Surgeon: Rogene Houston, MD;  [facility];  Service: Endoscopy;  Laterality: N/A;  1030    There were no vitals taken for this visit.  Visit Diagnosis:  Thoracic spine pain  Cervicalgia  Weakness of right side of body  Stiffness of cervical spine  Difficulty walking  Right anterior knee pain  Weakness of right hip  Stiffness of knee joint, right  Numbness and tingling of right arm and leg      Subjective Assessment - 01/11/15 1426    Symptoms Pt stated she had stomach bug last week and is still recovering from, feel week.  Knee feels stiff this morning, continues to pop when walking, pain scale    How long can you stand comfortably? 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 Feels she is walking slower due to knee stiffness<41minutes   Currently in Pain? Yes   Pain Score 5    Pain Location Shoulder   Pain Orientation Right   Pain Descriptors / Indicators Tingling;Aching   Pain Score 6   Pain Location Knee   Pain Orientation Right;Lateral          [facility] PT Assessment - 01/11/15 0001    Assessment   Medical Diagnosis Rt knee pain and stiffness resulting in difficulty walking,    Onset Date 10/07/14   Next MD Visit Aline Brochure, 02/21/2015   Prior Therapy no   AROM   AROM Assessment Site Ankle;Knee   Right Hip Extension 20  was 13   Right Hip External Rotation  44  was 401   Right Hip Internal Rotation  50  was 45   Left Hip Extension 15  was 15   Right/Left Knee Right;Left   Right Knee Extension 0  was 0   Right Knee Flexion 110  was 95   Left Knee Extension  0  was 0   Left Knee Flexion 117  was 110   Right/Left Ankle Right;Left   Right Ankle Dorsiflexion 12  was -3   Left Ankle Dorsiflexion 11  was 8   Cervical Flexion 43  was 38   Cervical Extension 70 painfree  was 68 painful   Cervical - Right Side Bend 45  was 43   Cervical - Left Side Bend 45  was 31   Cervical - Right Rotation 68  was 50   Cervical - Left Rotation 75  was 75   Lumbar Flexion 95  was 83   Lumbar Extension 40  was 40   Strength   Strength Assessment Site Hip;Knee   Right/Left Hip Right;Left   Right Hip Flexion 4+/5  was 4/5   Right Hip Extension 4+/5  was 4/5   Right Hip ABduction 4-/5  was 3+/5   Left Hip Flexion 5/5   Left Hip Extension 4/5  was 4/5   Left Hip ABduction 4/5  was 4/5   Right/Left Knee Right;Left   Right Knee Flexion 4/5  was 4-/5   Right Knee Extension 4/5  was 4-/5   Left Knee Flexion 5/5  5   Left Knee Extension 5/5   Right/Left  Ankle Right;Left   Right Ankle Dorsiflexion 5/5   Left Ankle Dorsiflexion 5/5   Flexibility   Quadriceps Ely's test: 112 degrees (Rt)  and 110 degrees (Lt)  Ely's test: 95 degrees (Rt)  and 100 degrees (Lt)   ITB Rt Obers test negative  Rt Obers test positive                  [facility] Adult PT Treatment/Exercise - 01/11/15 1509    Exercises   Exercises Neck;Knee/Hip   Neck Exercises: Theraband   Rows 10 reps;Green   Rows Limitations unilateral with minisquats and Bilateral 2 sets   Neck Exercises: Seated   Other Seated Exercise 3D cervical and thoracic excursion   Knee/Hip Exercises: Stretches   Active Hamstring Stretch 3 reps;30 seconds   Active Hamstring Stretch Limitations 8in step   Quad Stretch 4 reps;20 seconds   Hip Flexor Stretch 3 reps;30 seconds   Hip Flexor Stretch Limitations 8in step with ER   Knee: Self-Stretch Limitations 10   ITB Stretch 4 reps;20 seconds   ITB Stretch Limitations beside 6 in step   Gastroc Stretch 3 reps;30 seconds   Gastroc Stretch Limitations slant board   Knee/Hip Exercises: Standing   Forward Lunges 10 reps   Forward Lunges Limitations on 8in step with UE overhead   Functional Squat 10 reps   Functional Squat Limitations split stance                  PT Short Term Goals - 01/11/15 1440    PT SHORT TERM GOAL #1   Title Patient will be able to side bend cervical spine 45 degrees bilaterally   Baseline 45 degrees bilateral   Status Achieved   PT SHORT TERM GOAL #2   Title patient will be able to rotate cervical spine 60 degrees bilaterally.    Baseline Lt 75, Rt 68 degrees   Status Achieved   PT SHORT TERM GOAL #3   Title Patient will demonstrate independence with HEP   Status Achieved   PT SHORT TERM GOAL #4   Title Patient will state pain in neck decreased to <5/10 with prolonged standing >70minutes   Baseline Able to stand for 2 hours comfortably  Status Achieved   PT SHORT TERM GOAL #5   Title Patient will  demosntrate >15 degrees of horizontal abduction   Baseline Rt horizontal abduction 40 degrtees, Lt 30 degrees   Status Achieved   PT SHORT TERM GOAL #6   Title patient will dmeosntrate increased Knee flexion to 110 degrees to more easily squat to low chair   Baseline Lt 0-117, Rt 0-110 degrees   Status Achieved   PT SHORT TERM GOAL #7   Title Patient will dmeonstrate increased dorsiflexion of 8 degrees on R to improve deceleration mechanics during gait   Baseline Rt 12 degrees, Lt 11 degrees   Status Achieved   PT SHORT TERM GOAL #8   Title Patient will dmeonstrate a negative obers test indicating mproved lateral hip flexibility for improved decerleation mechanics during gait.    Baseline Negative obers test Bil   Status Achieved           PT Long Term Goals - 01/11/15 1505    PT LONG TERM GOAL #1   Title patient will be able to rotate cervical spine 70 degrees bilaterally.    Baseline Lt 75, Rt 68 degrees rotate   Status Partially Met   PT LONG TERM GOAL #2   Title Patient will demonstrate independence with advanced HEP   Status On-going   PT LONG TERM GOAL #3   Title patient will demonstrate hip internal and external rotation >30 degrees bilaterallyto be able to walk with toes in a neutral position   Baseline 50 degrees BIL IR   Status Achieved   PT LONG TERM GOAL #4   Title Patient will be able to stand >3 hours with pain numbness and tingling <3/10 and note decreased buckling of knee   Baseline Able to stand 2 hours with reports of decreased buckling of knee, pain scale 6/10   Status On-going   PT LONG TERM GOAL #5   Title Patient will dmeonstrate increased hamstring strength on Rt of 5/5 MMT to improve knee stability.    Baseline 4/5   Status On-going   PT LONG TERM GOAL #6   Title Patient will dmeonstrate increased hip abduction strength of 4/5 MMTon Rt to improve trendelenber gait and decrease strain on knee.    Baseline 4/5   Status On-going                Plan - 01/11/15 1620    Clinical Impression Statement Reassessment complete with the following findings:  Pt reports compliance with HEP with ability to verbalize or demonstrate approriate technique with all exercises.  Pt has met 8/8 STG and 1/6 LTGs and progressing well towards others.  Pt cervical AROM improved to WNL except for Rt rotation, still lacking minmal.  LE strength is progressing well.  Pt main problem now is Rt lateral knee pain with standing and walking with work activities.  Pt continues to display weak hip musculautre that does cause increase strain anterior knee.  Pt wil continue to benefit from skilled intervention to improve  knee flexion ROM and improve hip and hamstring musculature to reduce strain with functional activities as well as postural strengthening to improve thoracic mobilty to reduce strain on anterior knee.     PT Next Visit Plan Recommend continuing OPPT for 4 more weeks to address impairements not met addressed above.          Problem List Patient Active Problem List   Diagnosis Date Noted  . IBS (irritable bowel syndrome) 06/12/2013  .  Obesity 06/12/2013  . Diverticulitis of colon (without mention of hemorrhage) 01/26/2013  . Abdominal  pain, other specified site 01/12/2013  . Disc prolapse 04/20/2012  . Sciatica 01/25/2012   Ihor Austin, [place]  Aldona Lento 01/11/2015, 4:33 PM  [place] [address] [place], [place], [address] Phone: [phone]   Fax:  [phone]   Devona Konig PT DPT [phone]

## 2015-01-16 ENCOUNTER — Ambulatory Visit (HOSPITAL_COMMUNITY): Payer: BC Managed Care – PPO | Attending: Internal Medicine

## 2015-01-16 DIAGNOSIS — R202 Paresthesia of skin: Secondary | ICD-10-CM | POA: Diagnosis not present

## 2015-01-16 DIAGNOSIS — M546 Pain in thoracic spine: Secondary | ICD-10-CM | POA: Diagnosis not present

## 2015-01-16 DIAGNOSIS — M6289 Other specified disorders of muscle: Secondary | ICD-10-CM | POA: Insufficient documentation

## 2015-01-16 DIAGNOSIS — M436 Torticollis: Secondary | ICD-10-CM | POA: Diagnosis not present

## 2015-01-16 DIAGNOSIS — M542 Cervicalgia: Secondary | ICD-10-CM | POA: Insufficient documentation

## 2015-01-16 DIAGNOSIS — M25561 Pain in right knee: Secondary | ICD-10-CM

## 2015-01-16 DIAGNOSIS — M25661 Stiffness of right knee, not elsewhere classified: Secondary | ICD-10-CM

## 2015-01-16 DIAGNOSIS — R531 Weakness: Secondary | ICD-10-CM

## 2015-01-16 DIAGNOSIS — R29898 Other symptoms and signs involving the musculoskeletal system: Secondary | ICD-10-CM

## 2015-01-16 DIAGNOSIS — R262 Difficulty in walking, not elsewhere classified: Secondary | ICD-10-CM | POA: Diagnosis not present

## 2015-01-16 NOTE — Therapy (Signed)
[name], [place], [address] Phone: [phone]   Fax:  [phone]  Physical Therapy Treatment  Patient Details  Name: Rebecca Odom MRN: [id number] Date of Birth: February 23, 1971 Referring Provider:  Marval Regal, MD  Encounter Date: 01/16/2015      PT End of Session - 01/16/15 1743    Visit Number 9   Number of Visits 18   Date for PT Re-Evaluation 02/15/15   Authorization Type BCBS   Authorization - Visit Number 9   Authorization - Number of Visits 18   PT Start Time 4010   PT Stop Time 2725   PT Time Calculation (min) 43 min   Activity Tolerance Patient tolerated treatment well   Behavior During Therapy [facility] for tasks assessed/performed      Past Medical History  Diagnosis Date  . Bulging lumbar disc   . IBS (irritable bowel syndrome)   . Chronic abdominal pain   . Chronic diarrhea   . Diverticulosis   . Chronic nausea   . Chronic back pain   . Sciatica   . Kidney stones   . Headache     Past Surgical History  Procedure Laterality Date  . Endometrial ablation  June 2012  . Cholecystectomy  2004    [facility]-Dr. Arnoldo Morale  . Colonoscopy N/A 03/06/2013    Procedure: COLONOSCOPY;  Surgeon: Rogene Houston, MD;  [facility];  Service: Endoscopy;  Laterality: N/A;  1030    There were no vitals taken for this visit.  Visit Diagnosis:  Weakness of right side of body  Difficulty walking  Right anterior knee pain  Weakness of right hip  Stiffness of knee joint, right      Subjective Assessment - 01/16/15 1701    Symptoms Pt reported increased swelling Rt leg and medial kneel pain scale 8/10.   Currently in Pain? Yes   Pain Score 8    Pain Location Leg   Pain Orientation Right;Medial                    [facility] Adult PT Treatment/Exercise - 01/16/15 1720    Exercises   Exercises Knee/Hip   Knee/Hip Exercises: Stretches   Active Hamstring Stretch 3 reps;30 seconds   Active Hamstring Stretch Limitations 12in step   Hip Flexor Stretch 3 reps;30 seconds   Hip Flexor Stretch Limitations 8in step with ER   ITB Stretch 3 reps;20 seconds   ITB Stretch Limitations beside 6 in step   Gastroc Stretch 3 reps;30 seconds   Gastroc Stretch Limitations slant board   Knee/Hip Exercises: Standing   Forward Lunges 15 reps   Forward Lunges Limitations 6in box   Functional Squat 10 reps   Functional Squat Limitations split stance   Manual Therapy   Manual Therapy Edema management   Edema Management Retro massage with LE elevated   Joint Mobilization patlla mobs all directins                PT Education - 01/16/15 1722    Education provided Yes   Education Details Education on compression hose, referral sent to MD   Person(s) Educated Patient   Methods Explanation   Comprehension Verbalized understanding;Returned demonstration          PT Short Term Goals - 01/11/15 1440    PT SHORT TERM GOAL #1   Title Patient will be able to side bend cervical spine 45 degrees bilaterally   Baseline  45 degrees bilateral   Status Achieved   PT SHORT TERM GOAL #2   Title patient will be able to rotate cervical spine 60 degrees bilaterally.    Baseline Lt 75, Rt 68 degrees   Status Achieved   PT SHORT TERM GOAL #3   Title Patient will demonstrate independence with HEP   Status Achieved   PT SHORT TERM GOAL #4   Title Patient will state pain in neck decreased to <5/10 with prolonged standing >51minutes   Baseline Able to stand for 2 hours comfortably   Status Achieved   PT SHORT TERM GOAL #5   Title Patient will demosntrate >15 degrees of horizontal abduction   Baseline Rt horizontal abduction 40 degrtees, Lt 30 degrees   Status Achieved   PT SHORT TERM GOAL #6   Title patient will dmeosntrate increased Knee flexion to 110 degrees to more easily squat to low chair   Baseline Lt 0-117, Rt 0-110 degrees   Status Achieved   PT SHORT TERM GOAL #7   Title  Patient will dmeonstrate increased dorsiflexion of 8 degrees on R to improve deceleration mechanics during gait   Baseline Rt 12 degrees, Lt 11 degrees   Status Achieved   PT SHORT TERM GOAL #8   Title Patient will dmeonstrate a negative obers test indicating mproved lateral hip flexibility for improved decerleation mechanics during gait.    Baseline Negative obers test Bil   Status Achieved           PT Long Term Goals - 01/16/15 1750    PT LONG TERM GOAL #1   Title patient will be able to rotate cervical spine 70 degrees bilaterally.    PT LONG TERM GOAL #2   Title Patient will demonstrate independence with advanced HEP   Status On-going   PT LONG TERM GOAL #3   Title patient will demonstrate hip internal and external rotation >30 degrees bilaterallyto be able to walk with toes in a neutral position   Status Achieved   PT LONG TERM GOAL #4   Title Patient will be able to stand >3 hours with pain numbness and tingling <3/10 and note decreased buckling of knee   Status On-going   PT LONG TERM GOAL #5   Title Patient will dmeonstrate increased hamstring strength on Rt of 5/5 MMT to improve knee stability.    Status On-going   PT LONG TERM GOAL #6   Title Patient will dmeonstrate increased hip abduction strength of 4/5 MMTon Rt to improve trendelenber gait and decrease strain on knee.    Status On-going               Plan - 01/16/15 1745    Clinical Impression Statement Session focus on pain control and gluteal strengthening.  Added retro massage with elevation for edema and pain control.  Pt educated on benefits of compression hose and referral sent to MD.  Multimodal cueing required for appropriate form and weight loading with squats utilizing chair behind to improve weight loading.  Pt reported pain reduced to 5-6/10 at end of session, encouraged pt to apply ice with elevation for pain and edema control.     PT Next Visit Plan Give referral if received MD signature.   Continue with LTGs to improve hip and knee  mobilty and gluteal strengthening per PT POC.        Problem List Patient Active Problem List   Diagnosis Date Noted  . IBS (irritable bowel syndrome) 06/12/2013  .  Obesity 06/12/2013  . Diverticulitis of colon (without mention of hemorrhage) 01/26/2013  . Abdominal  pain, other specified site 01/12/2013  . Disc prolapse 04/20/2012  . Sciatica 01/25/2012   Ihor Austin, [place]  Aldona Lento 01/16/2015, 5:53 PM  [place] [address] [place], [place], [address] Phone: [phone]   Fax:  [phone]

## 2015-01-18 ENCOUNTER — Ambulatory Visit (HOSPITAL_COMMUNITY): Payer: BC Managed Care – PPO

## 2015-01-18 DIAGNOSIS — R262 Difficulty in walking, not elsewhere classified: Secondary | ICD-10-CM

## 2015-01-18 DIAGNOSIS — R531 Weakness: Secondary | ICD-10-CM

## 2015-01-18 DIAGNOSIS — M25661 Stiffness of right knee, not elsewhere classified: Secondary | ICD-10-CM

## 2015-01-18 DIAGNOSIS — M542 Cervicalgia: Secondary | ICD-10-CM | POA: Diagnosis not present

## 2015-01-18 DIAGNOSIS — M25561 Pain in right knee: Secondary | ICD-10-CM

## 2015-01-18 DIAGNOSIS — R29898 Other symptoms and signs involving the musculoskeletal system: Secondary | ICD-10-CM

## 2015-01-18 NOTE — Therapy (Signed)
[place], [place], [address] Phone: [phone]   Fax:  [phone]  Physical Therapy Treatment  Patient Details  Name: Rebecca Odom MRN: [id number] Date of Birth: 08/17/1971 Referring Provider:  Carole Civil, MD  Encounter Date: 01/18/2015      PT End of Session - 01/18/15 1451    Visit Number 10   Number of Visits 18   Date for PT Re-Evaluation 02/15/15   Authorization Type BCBS   Authorization - Visit Number 10   Authorization - Number of Visits 18   PT Start Time 7902   PT Stop Time 1525   PT Time Calculation (min) 51 min   Activity Tolerance Patient tolerated treatment well   Behavior During Therapy [facility] for tasks assessed/performed      Past Medical History  Diagnosis Date  . Bulging lumbar disc   . IBS (irritable bowel syndrome)   . Chronic abdominal pain   . Chronic diarrhea   . Diverticulosis   . Chronic nausea   . Chronic back pain   . Sciatica   . Kidney stones   . Headache     Past Surgical History  Procedure Laterality Date  . Endometrial ablation  June 2012  . Cholecystectomy  2004    [facility]-Dr. Arnoldo Morale  . Colonoscopy N/A 03/06/2013    Procedure: COLONOSCOPY;  Surgeon: Rogene Houston, MD;  [facility];  Service: Endoscopy;  Laterality: N/A;  1030    There were no vitals taken for this visit.  Visit Diagnosis:  Weakness of right side of body  Difficulty walking  Right anterior knee pain  Weakness of right hip  Stiffness of knee joint, right      Subjective Assessment - 01/18/15 1436    Symptoms Pt stated LE really swollen folloiwng long day at work, applied ice with elevation and took muscle relaxor for pain control.  Current pain scale 7/10 with edema present   Currently in Pain? Yes   Pain Score 7    Pain Location Knee   Pain Orientation Right;Anterior;Lateral   Pain Descriptors / Indicators Throbbing          [facility] PT Assessment - 01/18/15  0001    Assessment   Medical Diagnosis Rt knee pain and stiffness resulting in difficulty walking,    Onset Date 10/07/14   Next MD Visit Aline Brochure, 02/21/2015   Prior Therapy no                  [facility] Adult PT Treatment/Exercise - 01/18/15 1442    Exercises   Exercises Knee/Hip   Knee/Hip Exercises: Stretches   Active Hamstring Stretch 3 reps;30 seconds   Active Hamstring Stretch Limitations 12in step for Lt, 8 in for Rt 3 direeciotns   Quad Stretch 2 reps;30 seconds   Quad Stretch Limitations prone with rope   Hip Flexor Stretch 3 reps;30 seconds   Hip Flexor Stretch Limitations 8in step with ER   Gastroc Stretch 3 reps;30 seconds   Gastroc Stretch Limitations slant board   Knee/Hip Exercises: Standing   Forward Lunges Both;2 sets;10 reps   Forward Lunges Limitations 6in box   Functional Squat 10 reps;2 sets   Functional Squat Limitations 3D hip excursion in neutral then split stance with therapist facilitaiton for proper form   Other Standing Knee Exercises Sidestepping red [id number]   Knee/Hip Exercises: Supine   Bridges 10 reps   Other Supine Knee Exercises windshield wipers (  IR/ER with lknee extended)   Other Supine Knee Exercises     Knee/Hip Exercises: Sidelying   Hip ABduction Both;10 reps   Knee/Hip Exercises: Prone   Hip Extension Both;10 reps   Manual Therapy   Manual Therapy Edema management   Edema Management Retro massage with LE elevated   Joint Mobilization patlla mobs all directins                  PT Short Term Goals - 01/11/15 1440    PT SHORT TERM GOAL #1   Title Patient will be able to side bend cervical spine 45 degrees bilaterally   Baseline 45 degrees bilateral   Status Achieved   PT SHORT TERM GOAL #2   Title patient will be able to rotate cervical spine 60 degrees bilaterally.    Baseline Lt 75, Rt 68 degrees   Status Achieved   PT SHORT TERM GOAL #3   Title Patient will demonstrate independence with HEP   Status Achieved    PT SHORT TERM GOAL #4   Title Patient will state pain in neck decreased to <5/10 with prolonged standing >60minutes   Baseline Able to stand for 2 hours comfortably   Status Achieved   PT SHORT TERM GOAL #5   Title Patient will demosntrate >15 degrees of horizontal abduction   Baseline Rt horizontal abduction 40 degrtees, Lt 30 degrees   Status Achieved   PT SHORT TERM GOAL #6   Title patient will dmeosntrate increased Knee flexion to 110 degrees to more easily squat to low chair   Baseline Lt 0-117, Rt 0-110 degrees   Status Achieved   PT SHORT TERM GOAL #7   Title Patient will dmeonstrate increased dorsiflexion of 8 degrees on R to improve deceleration mechanics during gait   Baseline Rt 12 degrees, Lt 11 degrees   Status Achieved   PT SHORT TERM GOAL #8   Title Patient will dmeonstrate a negative obers test indicating mproved lateral hip flexibility for improved decerleation mechanics during gait.    Baseline Negative obers test Bil   Status Achieved           PT Long Term Goals - 01/18/15 1449    PT LONG TERM GOAL #1   Title patient will be able to rotate cervical spine 70 degrees bilaterally.    PT LONG TERM GOAL #2   Title Patient will demonstrate independence with advanced HEP   Status On-going   PT LONG TERM GOAL #3   Title patient will demonstrate hip internal and external rotation >30 degrees bilaterallyto be able to walk with toes in a neutral position   Status Achieved   PT LONG TERM GOAL #4   Title Patient will be able to stand >3 hours with pain numbness and tingling <3/10 and note decreased buckling of knee   Status On-going   PT LONG TERM GOAL #5   Title Patient will dmeonstrate increased hamstring strength on Rt of 5/5 MMT to improve knee stability.    Status On-going   PT LONG TERM GOAL #6   Title Patient will dmeonstrate increased hip abduction strength of 4/5 MMTon Rt to improve trendelenber gait and decrease strain on knee.    Status On-going                Plan - 01/18/15 1506    Clinical Impression Statement Referral not received for compression hose.  Session focus on gluteal strengthening and pain control;  Multimodal cueing required for  proper loading with squats to reduce stress on anterior knee aspect. due to weakness.   Added SLR all directions and began sidesteppnig with red tband resistance.  Pt knee flexion limited to 104 degrees flexion due to increased swelling.  Ended session with retro massage with elevation for edema and paincontrol.  Pt encouraged to apply ice with elevation at home for pain and edema control.     PT Next Visit Plan Give referral if received MD signature.  Continue with LTGs to improve hip and knee  mobilty and gluteal strengthening per PT POC.        Problem List Patient Active Problem List   Diagnosis Date Noted  . IBS (irritable bowel syndrome) 06/12/2013  . Obesity 06/12/2013  . Diverticulitis of colon (without mention of hemorrhage) 01/26/2013  . Abdominal  pain, other specified site 01/12/2013  . Disc prolapse 04/20/2012  . Sciatica 01/25/2012   Ihor Austin, [place]  Aldona Lento 01/18/2015, 3:26 PM  [place] [address] [place], [place], [address] Phone: [phone]   Fax:  [phone]

## 2015-01-23 ENCOUNTER — Ambulatory Visit (HOSPITAL_COMMUNITY): Payer: BC Managed Care – PPO

## 2015-01-25 ENCOUNTER — Ambulatory Visit (HOSPITAL_COMMUNITY): Payer: BC Managed Care – PPO | Admitting: Physical Therapy

## 2015-01-25 DIAGNOSIS — R262 Difficulty in walking, not elsewhere classified: Secondary | ICD-10-CM

## 2015-01-25 DIAGNOSIS — R29898 Other symptoms and signs involving the musculoskeletal system: Secondary | ICD-10-CM

## 2015-01-25 DIAGNOSIS — M25661 Stiffness of right knee, not elsewhere classified: Secondary | ICD-10-CM

## 2015-01-25 DIAGNOSIS — M25561 Pain in right knee: Secondary | ICD-10-CM

## 2015-01-25 DIAGNOSIS — M542 Cervicalgia: Secondary | ICD-10-CM | POA: Diagnosis not present

## 2015-01-25 NOTE — Therapy (Signed)
[place], [place], [address] Phone: [phone]   Fax:  [phone]  Physical Therapy Treatment  Patient Details  Name: Rebecca Odom MRN: [id number] Date of Birth: 12-Mar-1971 Referring Provider:  Carole Civil, MD  Encounter Date: 01/25/2015      PT End of Session - 01/25/15 1434    Visit Number 11   Number of Visits 18   Date for PT Re-Evaluation 02/15/15   Authorization Type BC[employer]   Authorization - Visit Number 11   Authorization - Number of Visits 18   PT Start Time 4585   PT Stop Time 1430   PT Time Calculation (min) 45 min   Activity Tolerance Patient tolerated treatment well   Behavior During Therapy [facility] for tasks assessed/performed      Past Medical History  Diagnosis Date  . Bulging lumbar disc   . IBS (irritable bowel syndrome)   . Chronic abdominal pain   . Chronic diarrhea   . Diverticulosis   . Chronic nausea   . Chronic back pain   . Sciatica   . Kidney stones   . Headache     Past Surgical History  Procedure Laterality Date  . Endometrial ablation  June 2012  . Cholecystectomy  2004    [facility]-Dr. Arnoldo Morale  . Colonoscopy N/A 03/06/2013    Procedure: COLONOSCOPY;  Surgeon: Rogene Houston, MD;  [facility];  Service: Endoscopy;  Laterality: N/A;  1030    There were no vitals filed for this visit.  Visit Diagnosis:  Difficulty walking  Right anterior knee pain  Weakness of right hip  Stiffness of knee joint, right      Subjective Assessment - 01/25/15 1353    Symptoms Patient states that she hasbeen feeling really good, Patient has a meetign with dietician on Monday to discuss dieting for weigh loss. patient notes no pain except in Right knee particularlly after prolonged stadning, no pain today just stiffness.    Currently in Pain? No/denies                       [facility] Adult PT Treatment/Exercise - 01/25/15 0001    Knee/Hip Exercises:  Stretches   Active Hamstring Stretch 3 reps;30 seconds   Active Hamstring Stretch Limitations 12in step for Lt, 8 in for Rt 3 direeciotns   Quad Stretch 2 reps;30 seconds   Quad Stretch Limitations prone with rope   Hip Flexor Stretch 3 reps;30 seconds   Hip Flexor Stretch Limitations 8in step with ER   ITB Stretch 3 reps;20 seconds   ITB Stretch Limitations beside 6 in step   Gastroc Stretch 3 reps;30 seconds   Gastroc Stretch Limitations slant board   Soleus Stretch 5 reps;10 seconds   Soleus Stretch Limitations VERY LIMITED   Knee/Hip Exercises: Standing   Forward Lunges 10 reps   Forward Lunges Limitations on floor cuign for heel weight bearing.    Side Lunges 10 reps   Side Lunges Limitations on floor static suing for heel weight bearing   Functional Squat Limitations 3D hip excursion in  split stance with therapist facilitaiton for proper form   Other Standing Knee Exercises Sidestepping red [id number]   Manual Therapy   Other Manual Therapy Soft tissue mobilization of Rt soleus                  PT Short Term Goals - 01/11/15 1440    PT  SHORT TERM GOAL #1   Title Patient will be able to side bend cervical spine 45 degrees bilaterally   Baseline 45 degrees bilateral   Status Achieved   PT SHORT TERM GOAL #2   Title patient will be able to rotate cervical spine 60 degrees bilaterally.    Baseline Lt 75, Rt 68 degrees   Status Achieved   PT SHORT TERM GOAL #3   Title Patient will demonstrate independence with HEP   Status Achieved   PT SHORT TERM GOAL #4   Title Patient will state pain in neck decreased to <5/10 with prolonged standing >28minutes   Baseline Able to stand for 2 hours comfortably   Status Achieved   PT SHORT TERM GOAL #5   Title Patient will demosntrate >15 degrees of horizontal abduction   Baseline Rt horizontal abduction 40 degrtees, Lt 30 degrees   Status Achieved   PT SHORT TERM GOAL #6   Title patient will dmeosntrate increased Knee flexion  to 110 degrees to more easily squat to low chair   Baseline Lt 0-117, Rt 0-110 degrees   Status Achieved   PT SHORT TERM GOAL #7   Title Patient will dmeonstrate increased dorsiflexion of 8 degrees on R to improve deceleration mechanics during gait   Baseline Rt 12 degrees, Lt 11 degrees   Status Achieved   PT SHORT TERM GOAL #8   Title Patient will dmeonstrate a negative obers test indicating mproved lateral hip flexibility for improved decerleation mechanics during gait.    Baseline Negative obers test Bil   Status Achieved           PT Long Term Goals - 01/25/15 1458    PT LONG TERM GOAL #1   Title patient will be able to rotate cervical spine 70 degrees bilaterally.    Status Partially Met   PT LONG TERM GOAL #2   Title Patient will demonstrate independence with advanced HEP   Status On-going   PT LONG TERM GOAL #3   Title patient will demonstrate hip internal and external rotation >30 degrees bilaterallyto be able to walk with toes in a neutral position   Status Achieved   PT LONG TERM GOAL #4   Title Patient will be able to stand >3 hours with pain numbness and tingling <3/10 and note decreased buckling of knee   Status On-going   PT LONG TERM GOAL #5   Title Patient will dmeonstrate increased hamstring strength on Rt of 5/5 MMT to improve knee stability.    Status On-going   PT LONG TERM GOAL #6   Title Patient will dmeonstrate increased hip abduction strength of 4/5 MMTon Rt to improve trendelenber gait and decrease strain on knee.    Status On-going               Plan - 01/25/15 1435    Clinical Impression Statement Patient displasy improved ROM with limited pain this session. Patien tattemted lunes initially with limited depth secondary to limited soleus mobility resulting inheel off as ankle apporached perpendicular. Following soleus stretches and soft tissue mobilization patient had increased lunge depth though still limited.     PT Next Visit Plan    Continue with LTGs to improve hip and knee  mobilty and gluteal strengthening. Pegin squats, Continue stretches with focus on gastroc a nd soleus flexibility: 3D ankle excursion at the wall with knee bent and straight, introduce squat matrix        Problem List Patient Active Problem  List   Diagnosis Date Noted  . IBS (irritable bowel syndrome) 06/12/2013  . Obesity 06/12/2013  . Diverticulitis of colon (without mention of hemorrhage) 01/26/2013  . Abdominal  pain, other specified site 01/12/2013  . Disc prolapse 04/20/2012  . Sciatica 01/25/2012   Devona Konig PT DPT [place], [place], [address] Phone: [phone]   Fax:  [phone]

## 2015-01-28 ENCOUNTER — Ambulatory Visit: Payer: BC Managed Care – PPO | Admitting: *Deleted

## 2015-01-30 ENCOUNTER — Ambulatory Visit (HOSPITAL_COMMUNITY): Payer: BC Managed Care – PPO | Admitting: Physical Therapy

## 2015-01-30 DIAGNOSIS — M25561 Pain in right knee: Secondary | ICD-10-CM

## 2015-01-30 NOTE — Therapy (Signed)
[place], [place], [address] Phone: [phone]   Fax:  [phone]  Patient Details  Name: Rebecca Odom MRN: [id number] Date of Birth: 1971-10-21 Referring Provider:  Marval Regal, MD  Encounter Date: 01/30/2015  Patient arrived with increased pain 10/10 along anterior medial knee. Positive tests include: Ely's, thomas, Mcmurray's, Thessaly, Faber's tests, femur bend test, and grind test all indicating a number of possible pathologies of he knee including meniscus, stress fracture, and osteoarthritis. Patient also has increased pain with hamstring stretching, gastroc stretching,quadriceps stretching, and groin stretching as well as palpation along medial anterior knee.  Rt knee is also excessively swelled.   Patient instructed to perform pain free stretching that she is currently performing as part of HEP, ice, elevate, take pain medication as prescribed,   Patient instructed to see MD if symptoms worsen.    Rebecca Odom 01/30/2015, 6:07 PM  [place], [place], [address] Phone: [phone]   Fax:  [phone]

## 2015-02-01 ENCOUNTER — Telehealth (HOSPITAL_COMMUNITY): Payer: Self-pay | Admitting: Physical Therapy

## 2015-02-01 ENCOUNTER — Ambulatory Visit (HOSPITAL_COMMUNITY): Payer: BC Managed Care – PPO | Admitting: Physical Therapy

## 2015-02-01 DIAGNOSIS — R29898 Other symptoms and signs involving the musculoskeletal system: Secondary | ICD-10-CM

## 2015-02-01 DIAGNOSIS — M25661 Stiffness of right knee, not elsewhere classified: Secondary | ICD-10-CM

## 2015-02-01 DIAGNOSIS — M542 Cervicalgia: Secondary | ICD-10-CM | POA: Diagnosis not present

## 2015-02-01 DIAGNOSIS — M25561 Pain in right knee: Secondary | ICD-10-CM

## 2015-02-01 DIAGNOSIS — R262 Difficulty in walking, not elsewhere classified: Secondary | ICD-10-CM

## 2015-02-01 NOTE — Therapy (Signed)
[place], [place], [address] Phone: [phone]   Fax:  [phone]  Physical Therapy Treatment  Patient Details  Name: Rebecca Odom MRN: [id number] Date of Birth: July 22, 1971 Referring Provider:  Carole Civil, MD  Encounter Date: 02/01/2015      PT End of Session - 02/01/15 0911    Visit Number 12   Number of Visits 18   Date for PT Re-Evaluation 02/15/15   Authorization Type BCBS   Authorization - Visit Number 12   Authorization - Number of Visits 18   PT Start Time 0901   PT Stop Time 0930   PT Time Calculation (min) 29 min   Activity Tolerance Patient tolerated treatment well   Behavior During Therapy [facility] for tasks assessed/performed      Past Medical History  Diagnosis Date  . Bulging lumbar disc   . IBS (irritable bowel syndrome)   . Chronic abdominal pain   . Chronic diarrhea   . Diverticulosis   . Chronic nausea   . Chronic back pain   . Sciatica   . Kidney stones   . Headache     Past Surgical History  Procedure Laterality Date  . Endometrial ablation  June 2012  . Cholecystectomy  2004    [facility]-Dr. Arnoldo Morale  . Colonoscopy N/A 03/06/2013    Procedure: COLONOSCOPY;  Surgeon: Rogene Houston, MD;  [facility];  Service: Endoscopy;  Laterality: N/A;  1030    There were no vitals filed for this visit.  Visit Diagnosis:  Right anterior knee pain  Difficulty walking  Weakness of right hip  Stiffness of knee joint, right      Subjective Assessment - 02/01/15 0914    Symptoms Patient states improved pain,but still high pain along anterior and anterior lateral knee Patient notes complain of knee popping   Currently in Pain? Yes   Pain Score 8    Pain Location Knee   Pain Orientation Right;Anterior;Lateral            [facility] PT Assessment - 02/01/15 0001    Assessment   Medical Diagnosis Rt knee pain and stiffness resulting in difficulty walking,    Onset  Date 10/07/14   Next MD Visit Aline Brochure, 02/21/2015   Prior Therapy no                   [facility] Adult PT Treatment/Exercise - 02/01/15 0001    Knee/Hip Exercises: Stretches   Active Hamstring Stretch 3 reps;30 seconds   Active Hamstring Stretch Limitations 12in step for Lt, 8 in for Rt 3 direeciotns   Quad Stretch 2 reps;30 seconds   Quad Stretch Limitations prone with rope   Hip Flexor Stretch 3 reps;30 seconds   Hip Flexor Stretch Limitations 8in step with ER   ITB Stretch 3 reps;20 seconds   ITB Stretch Limitations also knee to chest 3x 20 seconds   Gastroc Stretch 3 reps;30 seconds   Gastroc Stretch Limitations slant board   Soleus Stretch 5 reps;10 seconds   Soleus Stretch Limitations VERY LIMITED   Manual Therapy   Other Manual Therapy Soft tissue mobilization of Rt latral quad/hamstring TFL, glutes                PT Education - 02/01/15 0920    Education provided Yes   Education Details need for light stretches   Person(s) Educated Patient   Methods Explanation;Demonstration   Comprehension Verbalized understanding;Returned demonstration  PT Short Term Goals - 01/11/15 1440    PT SHORT TERM GOAL #1   Title Patient will be able to side bend cervical spine 45 degrees bilaterally   Baseline 45 degrees bilateral   Status Achieved   PT SHORT TERM GOAL #2   Title patient will be able to rotate cervical spine 60 degrees bilaterally.    Baseline Lt 75, Rt 68 degrees   Status Achieved   PT SHORT TERM GOAL #3   Title Patient will demonstrate independence with HEP   Status Achieved   PT SHORT TERM GOAL #4   Title Patient will state pain in neck decreased to <5/10 with prolonged standing >16minutes   Baseline Able to stand for 2 hours comfortably   Status Achieved   PT SHORT TERM GOAL #5   Title Patient will demosntrate >15 degrees of horizontal abduction   Baseline Rt horizontal abduction 40 degrtees, Lt 30 degrees   Status Achieved   PT SHORT  TERM GOAL #6   Title patient will dmeosntrate increased Knee flexion to 110 degrees to more easily squat to low chair   Baseline Lt 0-117, Rt 0-110 degrees   Status Achieved   PT SHORT TERM GOAL #7   Title Patient will dmeonstrate increased dorsiflexion of 8 degrees on R to improve deceleration mechanics during gait   Baseline Rt 12 degrees, Lt 11 degrees   Status Achieved   PT SHORT TERM GOAL #8   Title Patient will dmeonstrate a negative obers test indicating mproved lateral hip flexibility for improved decerleation mechanics during gait.    Baseline Negative obers test Bil   Status Achieved           PT Long Term Goals - 01/25/15 1458    PT LONG TERM GOAL #1   Title patient will be able to rotate cervical spine 70 degrees bilaterally.    Status Partially Met   PT LONG TERM GOAL #2   Title Patient will demonstrate independence with advanced HEP   Status On-going   PT LONG TERM GOAL #3   Title patient will demonstrate hip internal and external rotation >30 degrees bilaterallyto be able to walk with toes in a neutral position   Status Achieved   PT LONG TERM GOAL #4   Title Patient will be able to stand >3 hours with pain numbness and tingling <3/10 and note decreased buckling of knee   Status On-going   PT LONG TERM GOAL #5   Title Patient will dmeonstrate increased hamstring strength on Rt of 5/5 MMT to improve knee stability.    Status On-going   PT LONG TERM GOAL #6   Title Patient will dmeonstrate increased hip abduction strength of 4/5 MMTon Rt to improve trendelenber gait and decrease strain on knee.    Status On-going               Plan - 02/01/15 0921    Clinical Impression Statement Session focused on pain relief and ligth prolongesd stretches to decrease lateral deviation of knee durign gait and improv e hip and knee mobility. patient noted decreased pain at end of sessoion, "it feels pritty good now."    PT Next Visit Plan   Continue with LTGs to improve  hip and knee  mobilty and gluteal strengthening. reinitiate squats, Continue stretches with focus on gastroc a nd soleus flexibility: 3D ankle excursion at the wall with knee bent and straight, introduce squat matrix        Problem  List Patient Active Problem List   Diagnosis Date Noted  . IBS (irritable bowel syndrome) 06/12/2013  . Obesity 06/12/2013  . Diverticulitis of colon (without mention of hemorrhage) 01/26/2013  . Abdominal  pain, other specified site 01/12/2013  . Disc prolapse 04/20/2012  . Sciatica 01/25/2012    Devona Konig R 02/01/2015, 9:30 AM  [place] [address] [place], [place], [address] Phone: [phone]   Fax:  [phone]

## 2015-02-01 NOTE — Telephone Encounter (Signed)
Patient called to see if she would be coming in for scheduled appointment. Phone call placed at 8:20am, patient's appointment at 8:45am. Patient tated she would be attending therapy. Patient did not arrive.

## 2015-02-15 ENCOUNTER — Ambulatory Visit: Payer: BC Managed Care – PPO | Admitting: Dietician

## 2015-02-19 ENCOUNTER — Ambulatory Visit (HOSPITAL_COMMUNITY): Payer: BC Managed Care – PPO | Attending: Orthopedic Surgery

## 2015-02-19 DIAGNOSIS — M542 Cervicalgia: Secondary | ICD-10-CM | POA: Insufficient documentation

## 2015-02-19 DIAGNOSIS — M6289 Other specified disorders of muscle: Secondary | ICD-10-CM | POA: Insufficient documentation

## 2015-02-19 DIAGNOSIS — R202 Paresthesia of skin: Secondary | ICD-10-CM | POA: Insufficient documentation

## 2015-02-19 DIAGNOSIS — M546 Pain in thoracic spine: Secondary | ICD-10-CM | POA: Insufficient documentation

## 2015-02-19 DIAGNOSIS — R262 Difficulty in walking, not elsewhere classified: Secondary | ICD-10-CM | POA: Insufficient documentation

## 2015-02-19 DIAGNOSIS — M436 Torticollis: Secondary | ICD-10-CM | POA: Insufficient documentation

## 2015-02-21 ENCOUNTER — Ambulatory Visit (HOSPITAL_COMMUNITY): Payer: BC Managed Care – PPO | Admitting: Physical Therapy

## 2015-02-21 ENCOUNTER — Ambulatory Visit: Payer: BC Managed Care – PPO | Admitting: Orthopedic Surgery

## 2015-02-27 ENCOUNTER — Ambulatory Visit (HOSPITAL_COMMUNITY): Payer: BC Managed Care – PPO | Admitting: Physical Therapy

## 2015-02-27 DIAGNOSIS — R202 Paresthesia of skin: Secondary | ICD-10-CM | POA: Diagnosis not present

## 2015-02-27 DIAGNOSIS — M25561 Pain in right knee: Secondary | ICD-10-CM

## 2015-02-27 DIAGNOSIS — M436 Torticollis: Secondary | ICD-10-CM | POA: Diagnosis not present

## 2015-02-27 DIAGNOSIS — M6289 Other specified disorders of muscle: Secondary | ICD-10-CM | POA: Diagnosis not present

## 2015-02-27 DIAGNOSIS — M25661 Stiffness of right knee, not elsewhere classified: Secondary | ICD-10-CM

## 2015-02-27 DIAGNOSIS — R262 Difficulty in walking, not elsewhere classified: Secondary | ICD-10-CM | POA: Diagnosis not present

## 2015-02-27 DIAGNOSIS — M546 Pain in thoracic spine: Secondary | ICD-10-CM | POA: Diagnosis not present

## 2015-02-27 DIAGNOSIS — M542 Cervicalgia: Secondary | ICD-10-CM | POA: Diagnosis not present

## 2015-02-27 DIAGNOSIS — R29898 Other symptoms and signs involving the musculoskeletal system: Secondary | ICD-10-CM

## 2015-02-27 DIAGNOSIS — R531 Weakness: Secondary | ICD-10-CM

## 2015-02-27 NOTE — Therapy (Signed)
[place], [place], [address] Phone: [phone]   Fax:  [phone]  Physical Therapy Treatment  Patient Details  Name: Rebecca Odom MRN: [id number] Date of Birth: [birth year] Referring Provider:  Carole Civil, MD  Encounter Date: 02/27/2015      PT End of Session - 02/27/15 1805    Visit Number 13   Number of Visits 18   Date for PT Re-Evaluation 03/29/15   Authorization Type BCBS   Authorization - Visit Number 13   Authorization - Number of Visits 18   PT Start Time 1600   PT Stop Time 6301   PT Time Calculation (min) 45 min   Activity Tolerance Patient tolerated treatment well   Behavior During Therapy [facility] for tasks assessed/performed      Past Medical History  Diagnosis Date  . Bulging lumbar disc   . IBS (irritable bowel syndrome)   . Chronic abdominal pain   . Chronic diarrhea   . Diverticulosis   . Chronic nausea   . Chronic back pain   . Sciatica   . Kidney stones   . Headache     Past Surgical History  Procedure Laterality Date  . Endometrial ablation  June 2012  . Cholecystectomy  2004    [facility]-Dr. Arnoldo Morale  . Colonoscopy N/A 03/06/2013    Procedure: COLONOSCOPY;  Surgeon: Rogene Houston, MD;  [facility];  Service: Endoscopy;  Laterality: N/A;  1030    There were no vitals filed for this visit.  Visit Diagnosis:  Right anterior knee pain - Plan: PT plan of care cert/re-cert  Difficulty walking - Plan: PT plan of care cert/re-cert  Weakness of right hip - Plan: PT plan of care cert/re-cert  Stiffness of knee joint, right - Plan: PT plan of care cert/re-cert  Weakness of right side of body - Plan: PT plan of care cert/re-cert      Subjective Assessment - 02/27/15 1623    Subjective Patient states improved pain,but still high pain along anterior and anterior lmedial shin. Patient notes complain of knee popping still    Currently in Pain? Yes   Pain Score 7     Pain Location Knee   Pain Orientation Left            [facility] PT Assessment - 02/27/15 0001    Assessment   Medical Diagnosis Rt knee pain and stiffness resulting in difficulty walking,    Onset Date 10/07/14   Next MD Visit Aline Brochure, 02/21/2015   Prior Therapy no   AROM   Right Hip Extension 20  was 13   Right Hip External Rotation  44  was 401   Right Hip Internal Rotation  50  was 45   Left Hip Extension 15  was 15   Right Knee Extension 0   Right Knee Flexion 92   Left Knee Extension 0   Left Knee Flexion 117   Right Ankle Dorsiflexion 7   Left Ankle Dorsiflexion 6   Strength   Right Hip Flexion 5/5   Right Hip Extension 4-/5   Right Hip ABduction 3+/5   Left Hip Flexion 5/5   Left Hip Extension 4/5   Left Hip ABduction 4-/5   Right Knee Flexion 4/5   Right Knee Extension 4/5   Left Knee Flexion 5/5   Left Knee Extension 5/5   Right Ankle Dorsiflexion 5/5   Left Ankle Dorsiflexion 5/5   Flexibility  Quadriceps Ely's test: 95 degrees (Rt)  and 100 degrees (Lt)   ITB Rt Obers test positive           [facility] Adult PT Treatment/Exercise - 02/27/15 0001    Knee/Hip Exercises: Stretches   Active Hamstring Stretch 3 reps;30 seconds   Active Hamstring Stretch Limitations 12in step for Lt, 8 in for Rt 3 direeciotns   ITB Stretch 3 reps;20 seconds   Gastroc Stretch 3 reps;20 seconds   Gastroc Stretch Limitations 3 way with lateral heel wedge.    Knee/Hip Exercises: Standing   Forward Lunges 10 reps   Forward Lunges Limitations on floor cuign for heel weight bearing.    Other Standing Knee Exercises Sidestepping red [id number] under feet   Manual Therapy   Edema Management Retro massage with LE elevated   Joint Mobilization patlla mobs all directins   Other Manual Therapy Soft tissue mobilization of Rt latral quad/hamstring TFL, calf            PT Short Term Goals - 02/27/15 1816    PT SHORT TERM GOAL #1   Title Patient will be able to side bend  cervical spine 45 degrees bilaterally   Status Achieved   PT SHORT TERM GOAL #2   Title patient will be able to rotate cervical spine 60 degrees bilaterally.    Status Achieved   PT SHORT TERM GOAL #3   Title Patient will demonstrate independence with HEP   Status Achieved   PT SHORT TERM GOAL #4   Title Patient will state pain in neck decreased to <5/10 with prolonged standing >23minutes   Status Achieved   PT SHORT TERM GOAL #5   Title Patient will demosntrate >15 degrees of horizontal abduction   Status Achieved   PT SHORT TERM GOAL #6   Title patient will dmeosntrate increased Knee flexion to 110 degrees to more easily squat to low chair   Status Achieved   PT SHORT TERM GOAL #7   Title Patient will dmeonstrate increased dorsiflexion of 8 degrees on R to improve deceleration mechanics during gait   Status Achieved   PT SHORT TERM GOAL #8   Title Patient will dmeonstrate a negative obers test indicating mproved lateral hip flexibility for improved decerleation mechanics during gait.    Status Achieved           PT Long Term Goals - 02/27/15 1816    PT LONG TERM GOAL #1   Title patient will be able to rotate cervical spine 70 degrees bilaterally.    Status Achieved   PT LONG TERM GOAL #2   Title Patient will demonstrate independence with advanced HEP   Status Achieved   PT LONG TERM GOAL #3   Title patient will demonstrate hip internal and external rotation >30 degrees bilaterallyto be able to walk with toes in a neutral position   Status Achieved   PT LONG TERM GOAL #4   Title Patient will be able to stand >3 hours with pain numbness and tingling <3/10 and note decreased buckling of knee   Status On-going   PT LONG TERM GOAL #5   Title Patient will dmeonstrate increased hamstring strength on Rt of 5/5 MMT to improve knee stability.    Status On-going   PT LONG TERM GOAL #6   Title Patient will dmeonstrate increased hip abduction strength of 4/5 MMTon Rt to improve  trendelenber gait and decrease strain on knee.    Status On-going  Plan - 02/27/15 1806    Clinical Impression Statement Patient continues to have Rt knee pain attributed to limited ablity to regularly attend physical therapy as patient has been sick and had a very busy work schedule. Patient displays Rt knee stiffness secondary to limited joint mobility and limited quadriceps ROM, tertiary to poor loading mechanics during gait as patient displays veryl imited bilateral ankle eversion while wearing shoes as patient's shoes are rounded in th efrontal plane placing the foot on a more nstabe frontal plane surface. FOllowing removal of shoes nad LE stretches patient displayed improved gait.  Patient has overall made limited progress as she has been sick. PT plan to continue PT 2x a week for 8 more weeks to assist patient in reaching long term goals.    PT Next Visit Plan   Continue with LTGs to improve hip and knee  mobilty and gluteal strengthening. reinitiate squats, Continue stretches with focus on quadriceps, gastroc and soleus flexibility: 3D ankle excursion at the wall with knee bent        Problem List Patient Active Problem List   Diagnosis Date Noted  . IBS (irritable bowel syndrome) 06/12/2013  . Obesity 06/12/2013  . Diverticulitis of colon (without mention of hemorrhage) 01/26/2013  . Abdominal pain, other specified site 01/12/2013  . Disc prolapse 04/20/2012  . Sciatica 01/25/2012   Devona Konig PT DPT [place], [place], [address] Phone: [phone]   Fax:  [phone]

## 2015-03-01 ENCOUNTER — Ambulatory Visit (HOSPITAL_COMMUNITY): Payer: BC Managed Care – PPO

## 2015-03-01 ENCOUNTER — Telehealth (HOSPITAL_COMMUNITY): Payer: Self-pay

## 2015-03-01 NOTE — Telephone Encounter (Signed)
No show, called and left message reminding of next apt.   [address] [place]; [place] #[id number] [phone]

## 2015-03-05 ENCOUNTER — Ambulatory Visit (HOSPITAL_COMMUNITY): Payer: BC Managed Care – PPO | Admitting: Physical Therapy

## 2015-03-06 ENCOUNTER — Ambulatory Visit (HOSPITAL_COMMUNITY): Payer: BC Managed Care – PPO | Admitting: Physical Therapy

## 2015-03-07 ENCOUNTER — Encounter (HOSPITAL_COMMUNITY): Payer: BC Managed Care – PPO

## 2015-03-12 ENCOUNTER — Ambulatory Visit (HOSPITAL_COMMUNITY): Payer: BC Managed Care – PPO | Admitting: Physical Therapy

## 2015-03-12 DIAGNOSIS — M542 Cervicalgia: Secondary | ICD-10-CM | POA: Diagnosis not present

## 2015-03-12 DIAGNOSIS — R29898 Other symptoms and signs involving the musculoskeletal system: Secondary | ICD-10-CM

## 2015-03-12 DIAGNOSIS — M25561 Pain in right knee: Secondary | ICD-10-CM

## 2015-03-12 DIAGNOSIS — M25661 Stiffness of right knee, not elsewhere classified: Secondary | ICD-10-CM

## 2015-03-12 DIAGNOSIS — R262 Difficulty in walking, not elsewhere classified: Secondary | ICD-10-CM

## 2015-03-12 DIAGNOSIS — R531 Weakness: Secondary | ICD-10-CM

## 2015-03-12 NOTE — Therapy (Signed)
[place], [place], [address] Phone: [phone]   Fax:  [phone]  Physical Therapy Treatment  Patient Details  Name: Rebecca Odom MRN: [id number] Date of Birth: 13-Oct-1971 Referring Provider:  Marval Regal, MD  Encounter Date: 03/12/2015      PT End of Session - 03/12/15 1601    Visit Number 14   Number of Visits 18   Date for PT Re-Evaluation 03/29/15   Authorization Type BC[employer]   Authorization - Visit Number 14   Authorization - Number of Visits 18   PT Start Time 1520   PT Stop Time 1600   PT Time Calculation (min) 40 min   Activity Tolerance Patient tolerated treatment well   Behavior During Therapy [facility] for tasks assessed/performed      Past Medical History  Diagnosis Date  . Bulging lumbar disc   . IBS (irritable bowel syndrome)   . Chronic abdominal pain   . Chronic diarrhea   . Diverticulosis   . Chronic nausea   . Chronic back pain   . Sciatica   . Kidney stones   . Headache     Past Surgical History  Procedure Laterality Date  . Endometrial ablation  June 2012  . Cholecystectomy  2004    [facility]-Dr. Arnoldo Morale  . Colonoscopy N/A 03/06/2013    Procedure: COLONOSCOPY;  Surgeon: Rogene Houston, MD;  [facility];  Service: Endoscopy;  Laterality: N/A;  1030    There were no vitals filed for this visit.  Visit Diagnosis:  Right anterior knee pain  Difficulty walking  Weakness of right hip  Stiffness of knee joint, right  Weakness of right side of body      Subjective Assessment - 03/12/15 1536    Subjective Patient states that her knee is really hurting her today since she was on her feet a lot this weekend; approximately 7/10 pain today.    Pertinent History Rt knee pain as of 11/07/15. with knee pain continuing. patient was only able to partial weight bear 2 months ago but has had improving pain and ROM.  Patient used to drag Rt leg while walking but has had  improvements recently while having functional phsyical therapy for neck and shoulder.    Currently in Pain? Yes   Pain Score 7    Pain Location Knee   Pain Orientation Right                         [facility] Adult PT Treatment/Exercise - 03/12/15 0001    Knee/Hip Exercises: Stretches   Active Hamstring Stretch 3 reps;30 seconds   Active Hamstring Stretch Limitations on stairs, 3 way    Quad Stretch 30 seconds;2 reps   Quad Stretch Limitations prone with rope    Piriformis Stretch 2 reps;30 seconds   Piriformis Stretch Limitations seated   Gastroc Stretch 3 reps;30 seconds   Gastroc Stretch Limitations slantboard, 3-way    Knee/Hip Exercises: Standing   Heel Raises 1 set;20 reps   Forward Lunges Both;1 set;10 reps   Forward Lunges Limitations 4 inch box due to increased knee pain today    Functional Squat Limitations cues for form for squats however knee pain increased so activity terminated    Other Standing Knee Exercises 3D hip excursions 1x10   Other Standing Knee Exercises Sidestepping 2x62ft for hip ABD endurance    Knee/Hip Exercises: Supine   Bridges 1 set;10 reps  Straight Leg Raises Both;1 set;10 reps   Knee/Hip Exercises: Prone   Hip Extension Both;1 set;10 reps                PT Education - 03/12/15 1601    Education provided No          PT Short Term Goals - 02/27/15 1816    PT SHORT TERM GOAL #1   Title Patient will be able to side bend cervical spine 45 degrees bilaterally   Status Achieved   PT SHORT TERM GOAL #2   Title patient will be able to rotate cervical spine 60 degrees bilaterally.    Status Achieved   PT SHORT TERM GOAL #3   Title Patient will demonstrate independence with HEP   Status Achieved   PT SHORT TERM GOAL #4   Title Patient will state pain in neck decreased to <5/10 with prolonged standing >81minutes   Status Achieved   PT SHORT TERM GOAL #5   Title Patient will demosntrate >15 degrees of horizontal abduction    Status Achieved   PT SHORT TERM GOAL #6   Title patient will dmeosntrate increased Knee flexion to 110 degrees to more easily squat to low chair   Status Achieved   PT SHORT TERM GOAL #7   Title Patient will dmeonstrate increased dorsiflexion of 8 degrees on R to improve deceleration mechanics during gait   Status Achieved   PT SHORT TERM GOAL #8   Title Patient will dmeonstrate a negative obers test indicating mproved lateral hip flexibility for improved decerleation mechanics during gait.    Status Achieved           PT Long Term Goals - 02/27/15 1816    PT LONG TERM GOAL #1   Title patient will be able to rotate cervical spine 70 degrees bilaterally.    Status Achieved   PT LONG TERM GOAL #2   Title Patient will demonstrate independence with advanced HEP   Status Achieved   PT LONG TERM GOAL #3   Title patient will demonstrate hip internal and external rotation >30 degrees bilaterallyto be able to walk with toes in a neutral position   Status Achieved   PT LONG TERM GOAL #4   Title Patient will be able to stand >3 hours with pain numbness and tingling <3/10 and note decreased buckling of knee   Status On-going   PT LONG TERM GOAL #5   Title Patient will dmeonstrate increased hamstring strength on Rt of 5/5 MMT to improve knee stability.    Status On-going   PT LONG TERM GOAL #6   Title Patient will dmeonstrate increased hip abduction strength of 4/5 MMTon Rt to improve trendelenber gait and decrease strain on knee.    Status On-going               Plan - 03/12/15 1602    Clinical Impression Statement Patient presented with significant knee pain today, approximately 7/10; treatment adjusted accordingly with focus on functional stretching, proximal muscle activation, and cautious functional exercises for knee today. Cues for form throughout exercises and activities. Per patient pain mostly stayed the same throughout session with slight decrease at the end. Trialed  squats today with increased pain so activity terminated.    Pt will benefit from skilled therapeutic intervention in order to improve on the following deficits Abnormal gait;Decreased endurance;Increased muscle spasms;Improper body mechanics;Decreased activity tolerance;Decreased strength;Impaired flexibility;Difficulty walking;Decreased mobility;Decreased balance;Pain;Decreased range of motion;Increased fascial restricitons;Impaired UE functional use  PT Frequency 3x / week   PT Duration 6 weeks   PT Treatment/Interventions Gait training;Traction;Neuromuscular re-education;Stair training;Passive range of motion;Patient/family education;Functional mobility training;Therapeutic activities   PT Next Visit Plan   Continue with LTGs to improve hip and knee  mobilty and gluteal strengthening. reinitiate squats, Continue stretches with focus on quadriceps, gastroc and soleus flexibility: 3D ankle excursion at the wall with knee bent   PT Home Exercise Plan 3D thoracic spine excursion   Consulted and Agree with Plan of Care Patient        Problem List Patient Active Problem List   Diagnosis Date Noted  . IBS (irritable bowel syndrome) 06/12/2013  . Obesity 06/12/2013  . Diverticulitis of colon (without mention of hemorrhage) 01/26/2013  . Abdominal pain, other specified site 01/12/2013  . Disc prolapse 04/20/2012  . Sciatica 01/25/2012   Deniece Ree PT, DPT [place] [address] [place], [place], [address] Phone: [phone]   Fax:  [phone]

## 2015-03-13 ENCOUNTER — Ambulatory Visit (HOSPITAL_COMMUNITY): Payer: BC Managed Care – PPO | Admitting: Physical Therapy

## 2015-03-13 DIAGNOSIS — R262 Difficulty in walking, not elsewhere classified: Secondary | ICD-10-CM

## 2015-03-13 DIAGNOSIS — M25561 Pain in right knee: Secondary | ICD-10-CM

## 2015-03-13 DIAGNOSIS — M542 Cervicalgia: Secondary | ICD-10-CM | POA: Diagnosis not present

## 2015-03-13 DIAGNOSIS — R29898 Other symptoms and signs involving the musculoskeletal system: Secondary | ICD-10-CM

## 2015-03-13 DIAGNOSIS — M25661 Stiffness of right knee, not elsewhere classified: Secondary | ICD-10-CM

## 2015-03-13 NOTE — Therapy (Signed)
[place], [place], [address] Phone: [phone]   Fax:  [phone]  Physical Therapy Treatment  Patient Details  Name: Rebecca Odom MRN: [id number] Date of Birth: Nov 14, 1971 Referring Provider:  Marval Regal, MD  Encounter Date: 03/13/2015      PT End of Session - 03/13/15 1543    Visit Number 15   Number of Visits 18   Date for PT Re-Evaluation 03/29/15   Authorization Type BCBS   Authorization - Visit Number 15   Authorization - Number of Visits 18   PT Start Time 1525   PT Stop Time 1600   PT Time Calculation (min) 35 min   Activity Tolerance Patient tolerated treatment well   Behavior During Therapy [facility] for tasks assessed/performed      Past Medical History  Diagnosis Date  . Bulging lumbar disc   . IBS (irritable bowel syndrome)   . Chronic abdominal pain   . Chronic diarrhea   . Diverticulosis   . Chronic nausea   . Chronic back pain   . Sciatica   . Kidney stones   . Headache     Past Surgical History  Procedure Laterality Date  . Endometrial ablation  June 2012  . Cholecystectomy  2004    [facility]-Dr. Arnoldo Morale  . Colonoscopy N/A 03/06/2013    Procedure: COLONOSCOPY;  Surgeon: Rogene Houston, MD;  [facility];  Service: Endoscopy;  Laterality: N/A;  1030    There were no vitals filed for this visit.  Visit Diagnosis:  Right anterior knee pain  Difficulty walking  Weakness of right hip  Stiffness of knee joint, right      Subjective Assessment - 03/13/15 1539    Subjective Continued Rt knee medial pain after work though patient notes she had some relief after last session.    Currently in Pain? Yes   Pain Score 6    Pain Location Knee   Pain Orientation Right   Pain Descriptors / Indicators Throbbing                         [facility] Adult PT Treatment/Exercise - 03/13/15 0001    Knee/Hip Exercises: Standing   Heel Raises 3 sets;10 reps   Heel  Raises Limitations toes pointed 3 second count durign descent   Forward Lunges Both;10 reps   Forward Lunges Limitations with overhead to ankle high reach 10x    Other Standing Knee Exercises Sidestepping 2x73ft for hip ABD endurance red Tband   Knee/Hip Exercises: Sidelying   Hip ABduction Right;15 reps;2 sets   Manual Therapy   Joint Mobilization tibia femoral rotationional mobilizations frade 1-2                PT Education - 03/12/15 1601    Education provided No          PT Short Term Goals - 02/27/15 1816    PT SHORT TERM GOAL #1   Title Patient will be able to side bend cervical spine 45 degrees bilaterally   Status Achieved   PT SHORT TERM GOAL #2   Title patient will be able to rotate cervical spine 60 degrees bilaterally.    Status Achieved   PT SHORT TERM GOAL #3   Title Patient will demonstrate independence with HEP   Status Achieved   PT SHORT TERM GOAL #4   Title Patient will state pain in neck decreased to <5/10  with prolonged standing >16minutes   Status Achieved   PT SHORT TERM GOAL #5   Title Patient will demosntrate >15 degrees of horizontal abduction   Status Achieved   PT SHORT TERM GOAL #6   Title patient will dmeosntrate increased Knee flexion to 110 degrees to more easily squat to low chair   Status Achieved   PT SHORT TERM GOAL #7   Title Patient will dmeonstrate increased dorsiflexion of 8 degrees on R to improve deceleration mechanics during gait   Status Achieved   PT SHORT TERM GOAL #8   Title Patient will dmeonstrate a negative obers test indicating mproved lateral hip flexibility for improved decerleation mechanics during gait.    Status Achieved           PT Long Term Goals - 02/27/15 1816    PT LONG TERM GOAL #1   Title patient will be able to rotate cervical spine 70 degrees bilaterally.    Status Achieved   PT LONG TERM GOAL #2   Title Patient will demonstrate independence with advanced HEP   Status Achieved   PT LONG  TERM GOAL #3   Title patient will demonstrate hip internal and external rotation >30 degrees bilaterallyto be able to walk with toes in a neutral position   Status Achieved   PT LONG TERM GOAL #4   Title Patient will be able to stand >3 hours with pain numbness and tingling <3/10 and note decreased buckling of knee   Status On-going   PT LONG TERM GOAL #5   Title Patient will dmeonstrate increased hamstring strength on Rt of 5/5 MMT to improve knee stability.    Status On-going   PT LONG TERM GOAL #6   Title Patient will dmeonstrate increased hip abduction strength of 4/5 MMTon Rt to improve trendelenber gait and decrease strain on knee.    Status On-going               Plan - 03/13/15 1544    Clinical Impression Statement patient presented with conitnued Rt knee pain that was improved with soft tissue mobilization and exercise, though patient conitnues to have pain. particularly with menicus testing indicating positive likelyhood of meniscus pathology. session focused on glut medius and posteriro tib strengthening to decrease the degree of knee valgus during gait, noted improved gait at end of session.    PT Next Visit Plan   Continue with LTGs to improve hip and knee  mobilty and gluteal strengthening  Continue stretches with focus on quadriceps, gastroc and soleus flexibility: 3D ankle excursion at the wall with knee bent, and posterior tibialis and glut med strengthening.         Problem List Patient Active Problem List   Diagnosis Date Noted  . IBS (irritable bowel syndrome) 06/12/2013  . Obesity 06/12/2013  . Diverticulitis of colon (without mention of hemorrhage) 01/26/2013  . Abdominal pain, other specified site 01/12/2013  . Disc prolapse 04/20/2012  . Sciatica 01/25/2012   Devona Konig PT DPT [place], [place], [address] Phone: [phone]   Fax:  [phone]

## 2015-03-14 ENCOUNTER — Encounter (HOSPITAL_COMMUNITY): Payer: BC Managed Care – PPO

## 2015-03-27 ENCOUNTER — Encounter (HOSPITAL_COMMUNITY): Payer: BC Managed Care – PPO

## 2015-03-28 ENCOUNTER — Ambulatory Visit (HOSPITAL_COMMUNITY): Payer: BC Managed Care – PPO | Attending: Orthopedic Surgery | Admitting: Physical Therapy

## 2015-03-28 DIAGNOSIS — R29898 Other symptoms and signs involving the musculoskeletal system: Secondary | ICD-10-CM

## 2015-03-28 DIAGNOSIS — M436 Torticollis: Secondary | ICD-10-CM | POA: Insufficient documentation

## 2015-03-28 DIAGNOSIS — R202 Paresthesia of skin: Secondary | ICD-10-CM | POA: Diagnosis not present

## 2015-03-28 DIAGNOSIS — M542 Cervicalgia: Secondary | ICD-10-CM | POA: Insufficient documentation

## 2015-03-28 DIAGNOSIS — M25561 Pain in right knee: Secondary | ICD-10-CM

## 2015-03-28 DIAGNOSIS — M546 Pain in thoracic spine: Secondary | ICD-10-CM | POA: Diagnosis not present

## 2015-03-28 DIAGNOSIS — M6289 Other specified disorders of muscle: Secondary | ICD-10-CM | POA: Insufficient documentation

## 2015-03-28 DIAGNOSIS — R262 Difficulty in walking, not elsewhere classified: Secondary | ICD-10-CM | POA: Diagnosis not present

## 2015-03-28 DIAGNOSIS — M25661 Stiffness of right knee, not elsewhere classified: Secondary | ICD-10-CM

## 2015-03-28 DIAGNOSIS — R531 Weakness: Secondary | ICD-10-CM

## 2015-03-28 NOTE — Therapy (Signed)
[place], [place], [address] Phone: [phone]   Fax:  [phone]  Physical Therapy Treatment (Re-Assessment)  Patient Details  Name: Rebecca Odom MRN: [id number] Date of Birth: 05/18/1971 Referring Provider:  Marval Regal, MD  Encounter Date: 03/28/2015      PT End of Session - 03/28/15 1634    Visit Number 16   Number of Visits 18   Date for PT Re-Evaluation 04/25/15   Authorization Type BCBS   Authorization - Visit Number 16   Authorization - Number of Visits 18   PT Start Time 7106   PT Stop Time 1600   PT Time Calculation (min) 42 min   Activity Tolerance Patient tolerated treatment well   Behavior During Therapy [facility] for tasks assessed/performed      Past Medical History  Diagnosis Date  . Bulging lumbar disc   . IBS (irritable bowel syndrome)   . Chronic abdominal pain   . Chronic diarrhea   . Diverticulosis   . Chronic nausea   . Chronic back pain   . Sciatica   . Kidney stones   . Headache     Past Surgical History  Procedure Laterality Date  . Endometrial ablation  June 2012  . Cholecystectomy  2004    [facility]-Dr. Arnoldo Morale  . Colonoscopy N/A 03/06/2013    Procedure: COLONOSCOPY;  Surgeon: Rogene Houston, MD;  [facility];  Service: Endoscopy;  Laterality: N/A;  1030    There were no vitals filed for this visit.  Visit Diagnosis:  Right anterior knee pain  Difficulty walking  Weakness of right hip  Stiffness of knee joint, right  Weakness of right side of body      Subjective Assessment - 03/28/15 1632    Subjective The past couple days have been really tough pain wise; has appt with Dr. Aline Brochure on Monday.    Pertinent History Rt knee pain as of 11/07/15. with knee pain continuing. patient was only able to partial weight bear 2 months ago but has had improving pain and ROM.  Patient used to drag Rt leg while walking but has had improvements recently while  having functional phsyical therapy for neck and shoulder.    How long can you sit comfortably? 5/12- no limits    How long can you stand comfortably? 5/12- still pushing herself to stand for 3-4 hours, pain gets very high when she is at work    How long can you walk comfortably? 5/12- sometimes she can walk a long way without her knee bothering her, sometimes it is feeling bad and she has a harder time    Currently in Pain? Yes   Pain Score 6    Pain Location Knee   Pain Orientation Right            [facility] PT Assessment - 03/28/15 0001    AROM   Right Hip External Rotation  45   Right Hip Internal Rotation  45   Left Hip External Rotation  30   Left Hip Internal Rotation  35   Right Knee Extension 2   Right Knee Flexion 101   Right Ankle Dorsiflexion 11   Left Ankle Dorsiflexion 10   Strength   Right Hip Flexion 3+/5  pain limited    Right Hip Extension 4-/5   Right Hip ABduction 4-/5   Left Hip Flexion 4/5   Left Hip Extension 4-/5   Left Hip ABduction 4-/5  Right Knee Flexion 3-/5  pain limited    Right Knee Extension 3-/5  pain limited    Left Knee Flexion 5/5   Left Knee Extension 5/5   Right Ankle Dorsiflexion 4+/5   Left Ankle Dorsiflexion 5/5                     [facility] Adult PT Treatment/Exercise - 03/28/15 0001    Knee/Hip Exercises: Stretches   Active Hamstring Stretch 3 reps;30 seconds   Active Hamstring Stretch Limitations on 14 inch box    Gastroc Stretch 3 reps;30 seconds   Gastroc Stretch Limitations slantboard    Knee/Hip Exercises: Standing   Heel Raises 1 set;20 reps   Heel Raises Limitations 3 second count during descent    Forward Lunges Both;1 set;10 reps   Forward Lunges Limitations 7 inch step due to very high pain levels today    Other Standing Knee Exercises 3D hip excursions 1x10 (excluding transverse plane)   Other Standing Knee Exercises Sidestepping 2x32ft for hip ABD endurance                 PT Education -  03/28/15 1633    Education provided Yes   Education Details progress with skilled PT services, plan of care moving forward    Person(s) Educated Patient   Methods Explanation   Comprehension Verbalized understanding          PT Short Term Goals - 03/28/15 1642    PT SHORT TERM GOAL #1   Title Patient will be able to side bend cervical spine 45 degrees bilaterally   Baseline 45 degrees bilateral   Time 3   Period Weeks   Status Achieved   PT SHORT TERM GOAL #2   Title patient will be able to rotate cervical spine 60 degrees bilaterally.    Baseline Lt 75, Rt 68 degrees   Time 3   Period Weeks   Status Achieved   PT SHORT TERM GOAL #3   Title Patient will demonstrate independence with HEP   Time 3   Period Weeks   Status Achieved   PT SHORT TERM GOAL #4   Title Patient will state pain in neck decreased to <5/10 with prolonged standing >34minutes   Baseline Able to stand for 2 hours comfortably   Time 3   Period Weeks   Status Achieved   PT SHORT TERM GOAL #5   Title Patient will demosntrate >15 degrees of horizontal abduction   Baseline Rt horizontal abduction 40 degrtees, Lt 30 degrees   Time 3   Period Weeks   Status Achieved   PT SHORT TERM GOAL #6   Title patient will dmeosntrate increased Knee flexion to 110 degrees to more easily squat to low chair   Baseline Lt 0-117, Rt 0-110 degrees   Time 4   Period Weeks   Status Achieved   PT SHORT TERM GOAL #7   Title Patient will dmeonstrate increased dorsiflexion of 8 degrees on R to improve deceleration mechanics during gait   Baseline Rt 12 degrees, Lt 11 degrees   Time 4   Period Weeks   Status Achieved   PT SHORT TERM GOAL #8   Title Patient will dmeonstrate a negative obers test indicating mproved lateral hip flexibility for improved decerleation mechanics during gait.    Baseline Negative obers test Bil   Time 4   Period Weeks   Status Achieved  PT Long Term Goals - 03/28/15 1643    PT LONG  TERM GOAL #1   Title patient will be able to rotate cervical spine 70 degrees bilaterally.    Baseline Lt 75, Rt 68 degrees rotate   Time 6   Period Weeks   Status Achieved   PT LONG TERM GOAL #2   Title Patient will demonstrate independence with advanced HEP   Time 6   Period Weeks   Status Achieved   PT LONG TERM GOAL #3   Title patient will demonstrate hip internal and external rotation >30 degrees bilaterallyto be able to walk with toes in a neutral position   Baseline 50 degrees BIL IR   Time 6   Period Weeks   Status Achieved   PT LONG TERM GOAL #4   Title Patient will be able to stand >3 hours with pain numbness and tingling <3/10 and note decreased buckling of knee   Baseline Able to stand 2 hours with reports of decreased buckling of knee, pain scale 6/10   Time 8   Period Weeks   Status On-going   PT LONG TERM GOAL #5   Title Patient will dmeonstrate increased hamstring strength on Rt of 5/5 MMT to improve knee stability.    Time 8   Period Weeks   Status On-going   PT LONG TERM GOAL #6   Title Patient will dmeonstrate increased hip abduction strength of 4/5 MMTon Rt to improve trendelenber gait and decrease strain on knee.    Time 8   Period Weeks   Status On-going               Plan - 03/28/15 1635    Clinical Impression Statement Re-assessment performed today. Patient very pain limited today, and demonstrated reduced strength and impaired functional performance today because of the pain; however her range of motion has increased. Recent therapy sessions have focused on her knee. The patient states that she is continuing to having difficulty at work since she needs to be up on her feet a lot and is still having issues with her knee buckling at times. She also reports that she is scheduled to see Dr. Aline Brochure this coming Monday. Patient states that she would like to continue attempting Physical therapy to address her ongoing knee pain; she may beneftit from  contiuation of skilled PT services in order to continue addressing her deficits  and pain and to assist her in reaching an optimal level of function.    Pt will benefit from skilled therapeutic intervention in order to improve on the following deficits Abnormal gait;Decreased endurance;Increased muscle spasms;Improper body mechanics;Decreased activity tolerance;Decreased strength;Impaired flexibility;Difficulty walking;Decreased mobility;Decreased balance;Pain;Decreased range of motion;Increased fascial restricitons;Impaired UE functional use   PT Frequency 3x / week  however patient reports that she may only be able to come once a week due to work schedule    PT Duration 6 weeks   PT Treatment/Interventions Gait training;Traction;Neuromuscular re-education;Stair training;Passive range of motion;Patient/family education;Functional mobility training;Therapeutic activities   PT Next Visit Plan   Continue with LTGs to improve hip and knee  mobilty and gluteal strengthening  Continue stretches with focus on quadriceps, gastroc and soleus flexibility: 3D ankle excursion at the wall with knee bent, and posterior tibialis and glut med strengthening.    PT Home Exercise Plan 3D thoracic spine excursion   Consulted and Agree with Plan of Care Patient        Problem List Patient Active Problem List  Diagnosis Date Noted  . IBS (irritable bowel syndrome) 06/12/2013  . Obesity 06/12/2013  . Diverticulitis of colon (without mention of hemorrhage) 01/26/2013  . Abdominal pain, other specified site 01/12/2013  . Disc prolapse 04/20/2012  . Sciatica 01/25/2012   Deniece Ree PT, DPT [place] [address] [place], [place], [address] Phone: [phone]   Fax:  [phone]

## 2015-04-01 ENCOUNTER — Ambulatory Visit (INDEPENDENT_AMBULATORY_CARE_PROVIDER_SITE_OTHER): Payer: BC Managed Care – PPO | Admitting: Orthopedic Surgery

## 2015-04-01 ENCOUNTER — Encounter: Payer: Self-pay | Admitting: Orthopedic Surgery

## 2015-04-01 VITALS — BP 133/85 | Ht 63.0 in | Wt 240.0 lb

## 2015-04-01 DIAGNOSIS — M1711 Unilateral primary osteoarthritis, right knee: Secondary | ICD-10-CM | POA: Diagnosis not present

## 2015-04-01 NOTE — Patient Instructions (Signed)
Joint Injection  Care After  Refer to this sheet in the next few days. These instructions provide you with information on caring for yourself after you have had a joint injection. Your caregiver also may give you more specific instructions. Your treatment has been planned according to current medical practices, but problems sometimes occur. Call your caregiver if you have any problems or questions after your procedure.  After any type of joint injection, it is not uncommon to experience:  · Soreness, swelling, or bruising around the injection site.  · Mild numbness, tingling, or weakness around the injection site caused by the numbing medicine used before or with the injection.  It also is possible to experience the following effects associated with the specific agent after injection:  · Iodine-based contrast agents:  ¨ Allergic reaction (itching, hives, widespread redness, and swelling beyond the injection site).  · Corticosteroids (These effects are rare.):  ¨ Allergic reaction.  ¨ Increased blood sugar levels (If you have diabetes and you notice that your blood sugar levels have increased, notify your caregiver).  ¨ Increased blood pressure levels.  ¨ Mood swings.  · Hyaluronic acid in the use of viscosupplementation.  ¨ Temporary heat or redness.  ¨ Temporary rash and itching.  ¨ Increased fluid accumulation in the injected joint.  These effects all should resolve within a day after your procedure.   HOME CARE INSTRUCTIONS  · Limit yourself to light activity the day of your procedure. Avoid lifting heavy objects, bending, stooping, or twisting.  · Take prescription or over-the-counter pain medication as directed by your caregiver.  · You may apply ice to your injection site to reduce pain and swelling the day of your procedure. Ice may be applied 03-04 times:  ¨ Put ice in a plastic bag.  ¨ Place a towel between your skin and the bag.  ¨ Leave the ice on for no longer than 15-20 minutes each time.  SEEK  IMMEDIATE MEDICAL CARE IF:   · Pain and swelling get worse rather than better or extend beyond the injection site.  · Numbness does not go away.  · Blood or fluid continues to leak from the injection site.  · You have chest pain.  · You have swelling of your face or tongue.  · You have trouble breathing or you become dizzy.  · You develop a fever, chills, or severe tenderness at the injection site that last longer than 1 day.  MAKE SURE YOU:  · Understand these instructions.  · Watch your condition.  · Get help right away if you are not doing well or if you get worse.  Document Released: 07/16/2011 Document Revised: 01/25/2012 Document Reviewed: 07/16/2011  ExitCare® Patient Information ©2015 [employer]. This information is not intended to replace advice given to you by your health care provider. Make sure you discuss any questions you have with your health care provider.

## 2015-04-01 NOTE — Progress Notes (Signed)
This is a follow-up visit progress note  Chief complaint right knee pain  History is a 44 year old female with a BMI of 40+ presents with persistent right knee pain after physical therapy and oral medication which included tramadol and naproxen. She has peripatellar pain which is intermittent. Review of systems peripheral edema treated with compression hose  Exam.vs She walks with no assistive devices. Her appearance is normal other than the elevated BMI. 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 She did lose some weight she is down 3 pounds.  Recommend continue weight loss efforts Inject right knee  4 week follow-up possible reinjection Check weight in 4 weeks

## 2015-04-08 ENCOUNTER — Ambulatory Visit (HOSPITAL_COMMUNITY): Payer: BC Managed Care – PPO

## 2015-04-08 ENCOUNTER — Telehealth (HOSPITAL_COMMUNITY): Payer: Self-pay

## 2015-04-08 NOTE — Telephone Encounter (Signed)
No show; Tried to call mobile with mail box full, home phone has been disconnected and work phone number went straight to a fax line.  [address], [place]; [place] [address] [phone]

## 2015-04-10 ENCOUNTER — Telehealth (HOSPITAL_COMMUNITY): Payer: Self-pay

## 2015-04-10 ENCOUNTER — Ambulatory Visit (HOSPITAL_COMMUNITY): Payer: BC Managed Care – PPO | Admitting: Physical Therapy

## 2015-04-10 NOTE — Telephone Encounter (Signed)
She has to work she scheduled for 04/19/15

## 2015-04-19 ENCOUNTER — Ambulatory Visit (HOSPITAL_COMMUNITY): Payer: BC Managed Care – PPO | Attending: Orthopedic Surgery | Admitting: Physical Therapy

## 2015-04-19 DIAGNOSIS — M436 Torticollis: Secondary | ICD-10-CM | POA: Insufficient documentation

## 2015-04-19 DIAGNOSIS — M546 Pain in thoracic spine: Secondary | ICD-10-CM | POA: Insufficient documentation

## 2015-04-19 DIAGNOSIS — M542 Cervicalgia: Secondary | ICD-10-CM | POA: Insufficient documentation

## 2015-04-19 DIAGNOSIS — M6289 Other specified disorders of muscle: Secondary | ICD-10-CM | POA: Insufficient documentation

## 2015-04-19 DIAGNOSIS — R202 Paresthesia of skin: Secondary | ICD-10-CM | POA: Insufficient documentation

## 2015-04-19 DIAGNOSIS — R262 Difficulty in walking, not elsewhere classified: Secondary | ICD-10-CM | POA: Insufficient documentation

## 2015-05-02 ENCOUNTER — Ambulatory Visit: Payer: BC Managed Care – PPO | Admitting: Orthopedic Surgery

## 2015-05-07 ENCOUNTER — Ambulatory Visit (INDEPENDENT_AMBULATORY_CARE_PROVIDER_SITE_OTHER): Payer: BC Managed Care – PPO | Admitting: Orthopedic Surgery

## 2015-05-07 ENCOUNTER — Encounter: Payer: Self-pay | Admitting: Orthopedic Surgery

## 2015-05-07 VITALS — BP 153/92 | Ht 63.0 in | Wt 242.2 lb

## 2015-05-07 DIAGNOSIS — M1711 Unilateral primary osteoarthritis, right knee: Secondary | ICD-10-CM | POA: Diagnosis not present

## 2015-05-07 NOTE — Progress Notes (Signed)
Follow-up visit 1 month status post injection right knee in physical therapy she also takes Aleve and tramadol Ultracet formulation for osteophytes right knee. She is doing well with her right knee she went to therapy and then had some personal issues so she could go  Today's weight was 242 last time 240 pounds  Her BMI is above 40 and she has osteoarthritis of the knee has not quite ready for knee replacement from a clinical standpoint  Review of systems no mechanical symptoms  Examination reveals normal ambulation with heel-toe gait pattern  She has no tenderness in the knee no effusion. Her knee flexion ARC 125. Ligaments are stable. Motor exam shows good quadriceps function she is neurovascularly intact  Impression continue current medications treat osteoarthritis follow-up in 6 months

## 2015-11-07 ENCOUNTER — Encounter: Payer: Self-pay | Admitting: *Deleted

## 2015-11-07 ENCOUNTER — Ambulatory Visit: Payer: BC Managed Care – PPO | Admitting: Orthopedic Surgery

## 2015-12-09 ENCOUNTER — Other Ambulatory Visit: Payer: BC Managed Care – PPO | Admitting: Obstetrics & Gynecology

## 2015-12-17 ENCOUNTER — Other Ambulatory Visit: Payer: BC Managed Care – PPO | Admitting: Obstetrics & Gynecology

## 2015-12-26 ENCOUNTER — Other Ambulatory Visit: Payer: BC Managed Care – PPO | Admitting: Obstetrics & Gynecology

## 2016-01-27 ENCOUNTER — Ambulatory Visit: Payer: BC Managed Care – PPO | Admitting: Orthopedic Surgery

## 2016-02-03 ENCOUNTER — Ambulatory Visit: Payer: BC Managed Care – PPO | Admitting: Orthopedic Surgery

## 2016-03-04 ENCOUNTER — Ambulatory Visit: Payer: BC Managed Care – PPO | Admitting: Orthopedic Surgery

## 2016-03-18 ENCOUNTER — Ambulatory Visit: Payer: BC Managed Care – PPO | Admitting: Orthopedic Surgery

## 2016-03-19 ENCOUNTER — Encounter: Payer: Self-pay | Admitting: Orthopedic Surgery

## 2016-03-24 ENCOUNTER — Encounter (HOSPITAL_COMMUNITY): Payer: Self-pay | Admitting: Physical Therapy

## 2016-03-24 NOTE — Therapy (Signed)
[facility] [address] [place], [place], [address] Phone: [phone]   Fax:  [phone]  Patient Details  Name: JAE BRUCK MRN: [id number] Date of Birth: 1971-03-19 Referring Provider:  No ref. provider found  Encounter Date: 03/24/2016   PHYSICAL THERAPY DISCHARGE SUMMARY  Visits from Start of Care: 16  Current functional level related to goals / functional outcomes: Patient has not returned since last skilled session    Remaining deficits: Unable to assess    Education / Equipment: N/A  Plan: Patient agrees to discharge.  Patient goals were partially met. Patient is being discharged due to not returning since the last visit.  ?????        Deniece Ree PT, DPT [facility] [address] [place], [place], [address] Phone: [phone]   Fax:  [phone]

## 2016-07-23 ENCOUNTER — Encounter (HOSPITAL_COMMUNITY): Payer: Self-pay

## 2016-07-23 ENCOUNTER — Emergency Department (HOSPITAL_COMMUNITY)
Admission: EM | Admit: 2016-07-23 | Discharge: 2016-07-23 | Disposition: A | Discharge status: Home / Self Care | Payer: BC Managed Care – PPO | Attending: Emergency Medicine | Admitting: Emergency Medicine

## 2016-07-23 DIAGNOSIS — Y9301 Activity, walking, marching and hiking: Secondary | ICD-10-CM | POA: Insufficient documentation

## 2016-07-23 DIAGNOSIS — Z79899 Other long term (current) drug therapy: Secondary | ICD-10-CM | POA: Insufficient documentation

## 2016-07-23 DIAGNOSIS — X58XXXA Exposure to other specified factors, initial encounter: Secondary | ICD-10-CM | POA: Insufficient documentation

## 2016-07-23 DIAGNOSIS — S0501XA Injury of conjunctiva and corneal abrasion without foreign body, right eye, initial encounter: Secondary | ICD-10-CM | POA: Diagnosis not present

## 2016-07-23 DIAGNOSIS — S0591XA Unspecified injury of right eye and orbit, initial encounter: Secondary | ICD-10-CM | POA: Diagnosis present

## 2016-07-23 DIAGNOSIS — Y929 Unspecified place or not applicable: Secondary | ICD-10-CM | POA: Insufficient documentation

## 2016-07-23 DIAGNOSIS — J069 Acute upper respiratory infection, unspecified: Secondary | ICD-10-CM | POA: Insufficient documentation

## 2016-07-23 DIAGNOSIS — Y999 Unspecified external cause status: Secondary | ICD-10-CM | POA: Diagnosis not present

## 2016-07-23 MED ORDER — DIPHENHYDRAMINE HCL 25 MG PO CAPS
25.0000 mg | ORAL_CAPSULE | Freq: Once | ORAL | Status: AC
  Administered 2016-07-23: 25 mg via ORAL
  Filled 2016-07-23: qty 1

## 2016-07-23 MED ORDER — KETOROLAC TROMETHAMINE 60 MG/2ML IM SOLN
60.0000 mg | Freq: Once | INTRAMUSCULAR | Status: AC
  Administered 2016-07-23: 60 mg via INTRAMUSCULAR
  Filled 2016-07-23: qty 2

## 2016-07-23 MED ORDER — TETRACAINE HCL 0.5 % OP SOLN
2.0000 [drp] | Freq: Once | OPHTHALMIC | Status: AC
  Administered 2016-07-23: 2 [drp] via OPHTHALMIC
  Filled 2016-07-23: qty 4

## 2016-07-23 MED ORDER — CETIRIZINE HCL 10 MG PO CAPS: ORAL_CAPSULE | ORAL | 0 refills | Status: DC

## 2016-07-23 MED ORDER — FLUORESCEIN SODIUM 1 MG OP STRP
1.0000 | ORAL_STRIP | Freq: Once | OPHTHALMIC | Status: AC
  Administered 2016-07-23: 1 via OPHTHALMIC
  Filled 2016-07-23: qty 1

## 2016-07-23 MED ORDER — KETOROLAC TROMETHAMINE 0.5 % OP SOLN
1.0000 [drp] | Freq: Once | OPHTHALMIC | Status: AC
  Administered 2016-07-23: 1 [drp] via OPHTHALMIC
  Filled 2016-07-23: qty 5

## 2016-07-23 MED ORDER — METOCLOPRAMIDE HCL 5 MG/ML IJ SOLN
10.0000 mg | Freq: Once | INTRAMUSCULAR | Status: AC
  Administered 2016-07-23: 10 mg via INTRAMUSCULAR
  Filled 2016-07-23: qty 2

## 2016-07-23 MED ORDER — TOBRAMYCIN 0.3 % OP SOLN
1.0000 [drp] | Freq: Once | OPHTHALMIC | Status: AC
  Administered 2016-07-23: 1 [drp] via OPHTHALMIC
  Filled 2016-07-23: qty 5

## 2016-07-23 NOTE — Discharge Instructions (Signed)
Follow-up with your doctor for recheck.  Apply one drop of the ketorolac 4 times a day and one drop of the tobramycin every 4 hrs.  Call Dr. Iona Hansen to arrange a follow-up appt in 2-3 days if not improving

## 2016-07-23 NOTE — ED Provider Notes (Signed)
[facility] DEPT Provider Note   CSN: [id number] Arrival date & time: 07/23/16  0716     History   Chief Complaint Chief Complaint  Patient presents with  . Eye Pain    HPI Rebecca Odom is a 45 y.o. female.  HPI   Rebecca Odom is a 45 y.o. female who presents to the Emergency Department complaining of right eye pain, blurred vision and tearing of her eye upon waking.  She described difficulty focusing with the right eye and states her eye was red initially, but has since resolved.  She also complains of sinus pressure, nasal congestion, right ear pain, sore throat, and facial pain.  She also describes a frontal headache that is typical of her previous headaches.  She has not taken any medications for symptom relief.  She denies dizziness, vomiting, facial numbness, speech changes, extremity numbness or weakness.     Past Medical History:  Diagnosis Date  . Bulging lumbar disc   . Chronic abdominal pain   . Chronic back pain   . Chronic diarrhea   . Chronic nausea   . Diverticulosis   . Headache   . IBS (irritable bowel syndrome)   . Kidney stones   . Sciatica     Patient Active Problem List   Diagnosis Date Noted  . IBS (irritable bowel syndrome) 06/12/2013  . Obesity 06/12/2013  . Diverticulitis of colon (without mention of hemorrhage) 01/26/2013  . Abdominal pain, other specified site 01/12/2013  . Disc prolapse 04/20/2012  . Sciatica 01/25/2012    Past Surgical History:  Procedure Laterality Date  . CHOLECYSTECTOMY  2004   [facility]-Dr. Arnoldo Morale  . COLONOSCOPY N/A 03/06/2013   Procedure: COLONOSCOPY;  Surgeon: Rogene Houston, MD;  Location: [facility] SUITE;  Service: Endoscopy;  Laterality: N/A;  1030  . ENDOMETRIAL ABLATION  June 2012    OB History    Gravida Para Term Preterm AB Living   2 2 2     2    SAB TAB Ectopic Multiple Live Births                   Home Medications    Prior to Admission medications   Medication Sig Start Date End Date  Taking? Authorizing Provider  dicyclomine (BENTYL) 10 MG capsule Take 1 capsule (10 mg total) by mouth 3 (three) times daily before meals. 06/12/13   Rogene Houston, MD  methocarbamol (ROBAXIN) 500 MG tablet Take 1 tablet (500 mg total) by mouth every 6 (six) hours as needed for muscle spasms. 11/06/14   Carole Civil, MD  naproxen sodium (ANAPROX) 220 MG tablet Take 220 mg by mouth 2 (two) times daily with a meal.    Historical Provider, MD  omeprazole (PRILOSEC) 20 MG capsule Take 20 mg by mouth 2 (two) times daily.    Historical Provider, MD  traMADol-acetaminophen (ULTRACET) 37.5-325 MG per tablet Take 1 tablet by mouth every 4 (four) hours as needed. 11/06/14   Carole Civil, MD    Family History Family History  Problem Relation Age of Onset  . Heart disease Mother     heart attack  . Alzheimer's disease Mother   . Cancer Father     prostate  . Heart disease Sister     CHF  . Diabetes Brother   . Cancer Brother     prostate  . Heart disease Brother   . Stroke Maternal Grandmother   . Heart disease Maternal Grandfather  massive heart attack  . Colon cancer Neg Hx   . Colon polyps Neg Hx     Social History Social History  Substance Use Topics  . Smoking status: Never Smoker  . Smokeless tobacco: Never Used  . Alcohol use No     Allergies   Bee venom and Vicodin [hydrocodone-acetaminophen]   Review of Systems Review of Systems  Constitutional: Negative for activity change, appetite change, chills and fever.  HENT: Positive for congestion, rhinorrhea and sore throat. Negative for facial swelling and trouble swallowing.   Eyes: Negative for visual disturbance.  Respiratory: Positive for cough. Negative for shortness of breath, wheezing and stridor.   Gastrointestinal: Negative for nausea and vomiting.  Musculoskeletal: Negative for neck pain and neck stiffness.  Skin: Negative.   Neurological: Negative for dizziness, weakness, numbness and headaches.    Hematological: Negative for adenopathy.  Psychiatric/Behavioral: Negative for confusion.  All other systems reviewed and are negative.    Physical Exam Updated Vital Signs BP 164/99 (BP Location: Right Arm)   Pulse 87   Temp 98.8 F (37.1 C) (Oral)   Resp 18   Ht 5\' 3"  (1.6 m)   Wt 108.9 kg   SpO2 99%   BMI 42.51 kg/m   Physical Exam  Constitutional: She is oriented to person, place, and time. She appears well-developed and well-nourished. No distress.  HENT:  Head: Normocephalic and atraumatic.  Right Ear: No mastoid tenderness. Tympanic membrane is not erythematous and not bulging. A middle ear effusion is present. No hemotympanum.  Left Ear: Tympanic membrane normal.  Nose: Mucosal edema and rhinorrhea present. Right sinus exhibits frontal sinus tenderness.  Mouth/Throat: Uvula is midline, oropharynx is clear and moist and mucous membranes are normal. No trismus in the jaw. No posterior oropharyngeal edema, posterior oropharyngeal erythema or tonsillar abscesses.  Loss of landmarks.  Few air fluids levels.  Eyes: Conjunctivae and EOM are normal. Pupils are equal, round, and reactive to light. Lids are everted and swept, no foreign bodies found. Right eye exhibits no chemosis and no discharge. Left eye exhibits no discharge. Right conjunctiva is not injected. Left conjunctiva is not injected.  Fundoscopic exam:      The right eye shows no papilledema.  Slit lamp exam:      The right eye shows fluorescein uptake. The right eye shows no corneal flare, no corneal ulcer and no foreign body.    Slit lamp exam reveals small corneal abrasion at the 1'oclock position.  negative Seidel's sign, no hyphema or FB   Neck: Normal range of motion. Neck supple. No thyromegaly present.  Cardiovascular: Normal rate, regular rhythm and intact distal pulses.   No murmur heard. Pulmonary/Chest: Effort normal and breath sounds normal. No respiratory distress.  Abdominal: Soft. She exhibits no  distension. There is no tenderness.  Musculoskeletal: Normal range of motion.  Lymphadenopathy:    She has no cervical adenopathy.  Neurological: She is alert and oriented to person, place, and time. She has normal strength. No cranial nerve deficit. She exhibits normal muscle tone. Coordination and gait normal. GCS eye subscore is 4. GCS verbal subscore is 5. GCS motor subscore is 6.  No facial droop.  Speech clear.  No pronator drift.   Skin: Skin is warm and dry. No rash noted.  Nursing note and vitals reviewed.    ED Treatments / Results  Labs (all labs ordered are listed, but only abnormal results are displayed) Labs Reviewed - No data to display  EKG  EKG Interpretation None       Radiology No results found.  Procedures Procedures (including critical care time)  Medications Ordered in ED Medications  tetracaine (PONTOCAINE) 0.5 % ophthalmic solution 2 drop (2 drops Right Eye Given 07/23/16 0910)  ketorolac (TORADOL) injection 60 mg (60 mg Intramuscular Given 07/23/16 0842)  diphenhydrAMINE (BENADRYL) capsule 25 mg (25 mg Oral Given 07/23/16 0842)  metoCLOPramide (REGLAN) injection 10 mg (10 mg Intramuscular Given 07/23/16 0842)  fluorescein ophthalmic strip 1 strip (1 strip Right Eye Given 07/23/16 0910)  ketorolac (ACULAR) 0.5 % ophthalmic solution 1 drop (1 drop Right Eye Given 07/23/16 1017)  tobramycin (TOBREX) 0.3 % ophthalmic solution 1 drop (1 drop Right Eye Given 07/23/16 1017)     Initial Impression / Assessment and Plan / ED Course  I have reviewed the triage vital signs and the nursing notes.  Pertinent labs & imaging results that were available during my care of the patient were reviewed by me and considered in my medical decision making (see chart for details).  Clinical Course   IOP right eye obtained with tonopen 3 readings of: 14, 14, 11 mm Hg   Pt well appearing.  Ambulated to restroom with steady gait.  No focal neuro deficits on exam.    Feeling better  after medications, headache has improved.  Pt reports ready for d/c.  Requesting work note.  Small corneal abrasion likely from rubbing/scratching her eye.  Sx''s likely related to URI.  Doubt intracranial bleed.    Final Clinical Impressions(s) / ED Diagnoses   Final diagnoses:  Corneal abrasion, right, initial encounter  URI, acute    New Prescriptions New Prescriptions   No medications on file     Kem Parkinson, PA-C 07/24/16 [place], DO 07/24/16 2341

## 2016-07-23 NOTE — ED Triage Notes (Signed)
Pt reports woke up at 0530 and r eye was watery and vision was blurry in r eye.  Pt says her vision improved but still blurry.  Reports pain around r eye and across the bridge of her nose.

## 2016-07-23 NOTE — ED Notes (Signed)
Pt reports waking up with blurry vision in her R eye.  Pt also commented on throwing up yellow bile this morning and having a R ear ache.  Pt's husband told the nurse that she she is currently taking two different BP meds since April. Pt denies scratching her eye or any trauma to the R eye before this event.

## 2016-12-14 ENCOUNTER — Ambulatory Visit: Payer: BC Managed Care – PPO | Admitting: Orthopedic Surgery

## 2017-03-16 ENCOUNTER — Encounter: Payer: Self-pay | Admitting: Orthopedic Surgery

## 2017-03-16 ENCOUNTER — Ambulatory Visit (INDEPENDENT_AMBULATORY_CARE_PROVIDER_SITE_OTHER): Payer: BC Managed Care – PPO | Admitting: Orthopedic Surgery

## 2017-03-16 VITALS — BP 143/95 | HR 79 | Ht 64.0 in | Wt 236.0 lb

## 2017-03-16 DIAGNOSIS — S83002A Unspecified subluxation of left patella, initial encounter: Secondary | ICD-10-CM | POA: Diagnosis not present

## 2017-03-16 DIAGNOSIS — M25562 Pain in left knee: Secondary | ICD-10-CM

## 2017-03-16 MED ORDER — TRAMADOL HCL 50 MG PO TABS: 50.0000 mg | ORAL_TABLET | Freq: Four times a day (QID) | ORAL | 0 refills | Status: DC | PRN

## 2017-03-16 MED ORDER — NAPROXEN 500 MG PO TABS: 500.0000 mg | ORAL_TABLET | Freq: Two times a day (BID) | ORAL | 0 refills | Status: AC

## 2017-03-16 NOTE — Patient Instructions (Signed)
Recommend continue naproxen and tramadol.  Physical therapy 4 weeks  Out of work 4 weeks  Wear brace at all times except for sleeping and bathing

## 2017-03-16 NOTE — Progress Notes (Signed)
NEW PROBLEM   Chief Complaint  Patient presents with  . Knee Problem    left knee pain and giving away    46 year old female with known osteoarthritis in BMI close to 40 previously evaluated for osteoarthritis was scheduled for six-month follow-up. On Saturday, April 26 she was walking at her job turned a corner left knee buckled she presented to medical facility for pain swelling and trouble weightbearing. She complains of diffuse anterior knee pain medial and lateral seems to be more PERI-patellar area       Review of Systems  Musculoskeletal: Positive for falls and joint pain.  Neurological: Negative for tingling.      Past Medical History:  Diagnosis Date  . Bulging lumbar disc   . Chronic abdominal pain   . Chronic back pain   . Chronic diarrhea   . Chronic nausea   . Diverticulosis   . Headache   . IBS (irritable bowel syndrome)   . Kidney stones   . Sciatica    BP (!) 143/95   Pulse 79   Ht 5\' 4"  (1.626 m)   Wt 236 lb (107 kg)   BMI 40.51 kg/m   Physical Exam  Constitutional: She is oriented to person, place, and time. She appears well-developed and well-nourished. No distress.  Cardiovascular: Normal rate and intact distal pulses.   Musculoskeletal:       Left knee: She exhibits effusion.  Neurological: She is alert and oriented to person, place, and time. She has normal reflexes. She exhibits normal muscle tone. Coordination normal.  Skin: Skin is warm and dry. No rash noted. She is not diaphoretic. No erythema. No pallor.  Psychiatric: She has a normal mood and affect. Her behavior is normal. Judgment and thought content normal.     Right Knee Exam   Tenderness  The patient is experiencing no tenderness.     Range of Motion  Extension: normal  Flexion: normal   Muscle Strength   The patient has normal right knee strength.  Tests  McMurray:  Medial - negative Lateral - negative Drawer:       Anterior - negative    Posterior -  negative Varus: negative Valgus: negative  Other  Erythema: absent Scars: absent Sensation: normal Pulse: present Swelling: none   Left Knee Exam   Tenderness  The patient is experiencing tenderness in the medial retinaculum, lateral retinaculum, patella and lateral joint line.  Range of Motion  Extension: normal  Flexion:  90 abnormal   Muscle Strength   The patient has normal left knee strength.  Tests  McMurray:  Medial - negative Lateral - negative Drawer:       Anterior - negative     Posterior - negative Varus: negative Valgus: negative Patellar Apprehension: positive  Other  Erythema: absent Scars: absent Sensation: normal Pulse: present Swelling: none Effusion: effusion present     Plain films are on a disc there were 2 views are difficult to view but I know from her other x-ray she has osteoarthritis. The report" says no evidence of fracture, questionable lateral displacement of patella. 3 compartment osteophytosis. There is significant patellofemoral and medial femoral tibial compartment narrowing no effusion  Encounter Diagnoses  Name Primary?  . Acute pain of left knee Yes  . Subluxation of left patella, initial encounter     Plan recommend physical therapy for 4 weeks Economy hinged bracing No work for 4 weeks Continue Naprosyn and tramadol Return in 4 weeks for reevaluation of return  to work

## 2017-03-22 ENCOUNTER — Encounter (HOSPITAL_COMMUNITY): Payer: Self-pay | Admitting: Physical Therapy

## 2017-03-22 ENCOUNTER — Ambulatory Visit (HOSPITAL_COMMUNITY): Payer: BC Managed Care – PPO | Attending: Orthopedic Surgery | Admitting: Physical Therapy

## 2017-03-22 DIAGNOSIS — M25662 Stiffness of left knee, not elsewhere classified: Secondary | ICD-10-CM | POA: Insufficient documentation

## 2017-03-22 DIAGNOSIS — R262 Difficulty in walking, not elsewhere classified: Secondary | ICD-10-CM | POA: Diagnosis present

## 2017-03-22 DIAGNOSIS — R29898 Other symptoms and signs involving the musculoskeletal system: Secondary | ICD-10-CM | POA: Diagnosis present

## 2017-03-22 DIAGNOSIS — M6281 Muscle weakness (generalized): Secondary | ICD-10-CM | POA: Diagnosis present

## 2017-03-22 DIAGNOSIS — M25562 Pain in left knee: Secondary | ICD-10-CM | POA: Diagnosis present

## 2017-03-22 NOTE — Therapy (Signed)
[facility] [address] [place], [place], [address] Phone: [phone]   Fax:  [phone]  Physical Therapy Evaluation  Patient Details  Name: Rebecca Odom MRN: [id number] Date of Birth: 17-Jul-1971 Referring Provider: Arther Abbott   Encounter Date: 03/22/2017      PT End of Session - 03/22/17 1609    Visit Number 1   Number of Visits 9   Date for PT Re-Evaluation 04/19/17   Authorization Type [place] Other    Authorization Time Period 03/22/17 to 04/22/17   PT Start Time 1522   PT Stop Time 1557   PT Time Calculation (min) 35 min   Activity Tolerance Patient tolerated treatment well   Behavior During Therapy [facility] for tasks assessed/performed      Past Medical History:  Diagnosis Date  . Bulging lumbar disc   . Chronic abdominal pain   . Chronic back pain   . Chronic diarrhea   . Chronic nausea   . Diverticulosis   . Headache   . IBS (irritable bowel syndrome)   . Kidney stones   . Sciatica     Past Surgical History:  Procedure Laterality Date  . CHOLECYSTECTOMY  2004   [facility]-Dr. Arnoldo Morale  . COLONOSCOPY N/A 03/06/2013   Procedure: COLONOSCOPY;  Surgeon: Rogene Houston, MD;  [facility];  Service: Endoscopy;  Laterality: N/A;  1030  . ENDOMETRIAL ABLATION  June 2012    There were no vitals filed for this visit.       Subjective Assessment - 03/22/17 1526    Subjective Patient reports she works two jobs; she was at her second job and she was walking across the floor at her second job and her knee buckled and she was able to catch herself. For awhile she could not put any pressure on her knee without it killing again. She states taht about 3 weeks ago she was helping her daughter move and she is wondering if this has anything to do with it. This all happened about a week and a half ago. She states it is getting better somewhat on its own, she says MD thinks she dislocated her patella.  Stairs are hard, walking in general is OK, she has some pain when she tries to turn over in bed. SHe ices at night as well.    Pertinent History IBS, sciatica, hx of back pain    How long can you sit comfortably? none, hurts more laying especially when turning over    How long can you stand comfortably? knee with give, seems to spasm    How long can you walk comfortably? gives with walking    Patient Stated Goals strengthen knee, improve stairs    Currently in Pain? Yes   Pain Score 7   no physical signs of distress noted    Pain Location Knee   Pain Orientation Left   Pain Descriptors / Indicators Sharp;Tingling   Pain Type Acute pain   Pain Radiating Towards shoots down to toes sometimes    Pain Onset 1 to 4 weeks ago   Pain Frequency Intermittent   Aggravating Factors  stairs, walking   Pain Relieving Factors ice, elevating knee, limiting activity    Effect of Pain on Daily Activities severe limit             [facility] PT Assessment - 03/22/17 0001      Assessment   Medical Diagnosis actue L knee  pain    Referring Provider Arther Abbott    Onset Date/Surgical Date --  April 28th   Next MD Visit Dr. Aline Brochure late May or earlier if needed    Prior Therapy PT for her her other knee      Precautions   Precautions None     Balance Screen   Has the patient fallen in the past 6 months No   Has the patient had a decrease in activity level because of a fear of falling?  Yes   Is the patient reluctant to leave their home because of a fear of falling?  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      AROM   Right Knee Extension -4   Right Knee Flexion 112   Left Knee Extension 5   Left Knee Flexion 106     Strength   Right Hip Flexion 4/5   Right Hip Extension 3-/5   Right Hip ABduction 4/5    Left Hip Flexion 4/5   Left Hip Extension 3-/5   Left Hip ABduction 3+/5   Right Knee Flexion 4/5   Right Knee Extension 4/5   Left Knee Flexion 4/5   Left Knee Extension 4-/5   Right Ankle Dorsiflexion 5/5   Left Ankle Dorsiflexion 4+/5     Ambulation/Gait   Gait Comments antalgic gait pattern, prontation and reduced knee ROM during gait cycle noted L LE, reduced weight bearing L LE, circumduction noted L LE                            PT Education - 03/22/17 1608    Education provided Yes   Education Details examination findings, POC, HEP; will likely add to HEP next session    Person(s) Educated Patient   Methods Explanation;Demonstration;Handout   Comprehension Verbalized understanding;Returned demonstration;Need further instruction          PT Short Term Goals - 03/22/17 1613      PT SHORT TERM GOAL #1   Title patient to demonstrate ROM in B knees as being equal in order to reduce pain and improve gross mechanics    Time 2   Period Weeks   Status New     PT SHORT TERM GOAL #2   Title Patient to experience pain no more than 3/10 in order to improve QOL and activity tolerance    Time 2   Period Weeks   Status New     PT SHORT TERM GOAL #3   Title patient to demonstrate improved gait mechanics to include improved ROM L knee, elimination of antalgic gait pattern, reduced pronation L in order to reduce pain and improve tolerance to regular activities    Time 2   Period Weeks   Status New     PT SHORT TERM GOAL #4   Title Patient to correctly and consistently perform appropraite HEP, to be updated PRN    Time 1   Period Weeks   Status New           PT Long Term Goals - 03/22/17 1615      PT LONG TERM GOAL #1   Title Patient to demonstrate functional strength at least 4+/5 in all tested muscle groups in order to reduce pain and improve gross mechanics  Time 4   Period Weeks   Status New     PT LONG TERM GOAL #2   Title Patient to be  able to tolerate at least 1/2 work shift with no pain exacerbation in order to facilitate return to PLOF    Time 4   Period Weeks   Status New     PT LONG TERM GOAL #3   Title patient to be able to ascend/descend full flight of stairs with reciprocal pattern, good eccentric control, and minimal unsteadiness in order to improve QOL and activity tolernace    Time 4   Period Weeks   Status New     PT LONG TERM GOAL #4   Title Patient to be participatory in regular exercise program, at least 20 minutes in duration and at least 3 days per week, in order to maintain functional gains and improve general health status moving forward    Time 4   Period Weeks   Status New               Plan - 03/22/17 1610    Clinical Impression Statement Patient arrives approximately 1.5 weeks after experiencing her knee buckling at work; she reports that it has given her tremendous problems with stairs and regular activities since this time and that she is currently out of her two jobs per MD advice. Examination reveals localized edema, functional muscle weakness, significant gait impairment, and reduced ability to participate in PLOF based tasks. Recommend further special tests next session- including ligament and meniscus tests- in order to further identify possible impairment in knee. Recommend trial of skilled PT services in order to address functional deficits and assist return to PLOF based activities moving forward.    Rehab Potential Good   Clinical Impairments Affecting Rehab Potential (+) appears motivated to participate in PT, young age; (-) obesity, mixed results with PT for other knee, sedentary lifestyle    PT Frequency 2x / week   PT Duration 4 weeks   PT Treatment/Interventions ADLs/Self Care Home Management;Cryotherapy;DME Instruction;Gait training;Stair training;Functional mobility training;Therapeutic activities;Therapeutic exercise;Balance training;Neuromuscular re-education;Patient/family  education;Orthotic Fit/Training;Manual techniques;Passive range of motion;Taping   PT Next Visit Plan review HEP and initial eval/goals; special testing for ligaments and meniscus involvement; get ROM of L knee equal to that of R, then work on functional strength. Edema control, gait training. Add heelslides, hip ABD to HEP    PT Home Exercise Plan Eval quad sets, SLRs, bridges    Recommended Other Services possible shoe lift or posting L due to prontation    Consulted and Agree with Plan of Care Patient      Patient will benefit from skilled therapeutic intervention in order to improve the following deficits and impairments:  Abnormal gait, Pain, Decreased mobility, Decreased activity tolerance, Decreased range of motion, Decreased strength, Difficulty walking, Increased edema, Impaired flexibility  Visit Diagnosis: Acute pain of left knee - Plan: PT plan of care cert/re-cert  Muscle weakness (generalized) - Plan: PT plan of care cert/re-cert  Stiffness of left knee, not elsewhere classified - Plan: PT plan of care cert/re-cert  Difficulty in walking, not elsewhere classified - Plan: PT plan of care cert/re-cert  Other symptoms and signs involving the musculoskeletal system - Plan: PT plan of care cert/re-cert     Problem List Patient Active Problem List   Diagnosis Date Noted  . IBS (irritable bowel syndrome) 06/12/2013  . Obesity 06/12/2013  . Diverticulitis of colon (without mention of hemorrhage)(562.11) 01/26/2013  . Abdominal pain,  other specified site 01/12/2013  . Disc prolapse 04/20/2012  . Sciatica 01/25/2012    Deniece Ree PT, DPT [facility] [address] [place], [place], [address] Phone: [phone]   Fax:  [phone]  Name: Rebecca Odom MRN: [id number] Date of Birth: June 27, 1971

## 2017-03-22 NOTE — Patient Instructions (Signed)
   BRIDGING  While lying on your back, tighten your lower abdominals, squeeze your buttocks and then raise your buttocks off the floor/bed as creating a "Bridge" with your body.   Repeat 10 times, twice a day.     QUAD SET  Tighten your top thigh muscle as you attempt to press the back of your knee downward towards the table.  Hold for 3 seconds and relax.  Repeat 15 times, at least 3 times per day.       STRAIGHT LEG RAISE 2 - SLR   While lying or sitting, raise up your leg with a straight knee. Keep both knees straight the entire time.  Make sure you are really squeezing the quad muscle in front.  Repeat 10 times, twice a day.

## 2017-03-25 ENCOUNTER — Encounter (HOSPITAL_COMMUNITY): Payer: Self-pay

## 2017-03-25 ENCOUNTER — Ambulatory Visit (HOSPITAL_COMMUNITY): Payer: BC Managed Care – PPO

## 2017-03-25 DIAGNOSIS — M25662 Stiffness of left knee, not elsewhere classified: Secondary | ICD-10-CM

## 2017-03-25 DIAGNOSIS — R29898 Other symptoms and signs involving the musculoskeletal system: Secondary | ICD-10-CM

## 2017-03-25 DIAGNOSIS — R262 Difficulty in walking, not elsewhere classified: Secondary | ICD-10-CM

## 2017-03-25 DIAGNOSIS — M25562 Pain in left knee: Secondary | ICD-10-CM | POA: Diagnosis not present

## 2017-03-25 DIAGNOSIS — M6281 Muscle weakness (generalized): Secondary | ICD-10-CM

## 2017-03-25 NOTE — Therapy (Signed)
[facility] [place], [place], [address] Phone: [phone]   Fax:  [phone]  Physical Therapy Treatment  Patient Details  Name: Rebecca Odom MRN: [id number] Date of Birth: 02/13/1971 Referring Provider: Arther Abbott   Encounter Date: 03/25/2017      PT End of Session - 03/25/17 0910    Visit Number 2   Number of Visits 9   Date for PT Re-Evaluation 04/19/17   Authorization Type [place] Other    Authorization Time Period 03/22/17 to 04/22/17   PT Start Time 0910   PT Stop Time 0947   PT Time Calculation (min) 37 min   Activity Tolerance Patient tolerated treatment well   Behavior During Therapy [facility] for tasks assessed/performed      Past Medical History:  Diagnosis Date  . Bulging lumbar disc   . Chronic abdominal pain   . Chronic back pain   . Chronic diarrhea   . Chronic nausea   . Diverticulosis   . Headache   . IBS (irritable bowel syndrome)   . Kidney stones   . Sciatica     Past Surgical History:  Procedure Laterality Date  . CHOLECYSTECTOMY  2004   [facility]-Dr. Arnoldo Morale  . COLONOSCOPY N/A 03/06/2013   Procedure: COLONOSCOPY;  Surgeon: Rogene Houston, MD;  [facility];  Service: Endoscopy;  Laterality: N/A;  1030  . ENDOMETRIAL ABLATION  June 2012    There were no vitals filed for this visit.      Subjective Assessment - 03/25/17 0912    Subjective Pt states that her L knee still hurts, but she has been able to put a lot more pressure on it. She states that she feels better overall compared to Monday. She states that she's not on her feet a lot per MD but when she is on it, it gets tired quickly.   Pertinent History IBS, sciatica, hx of back pain    How long can you sit comfortably? none, hurts more laying especially when turning over    How long can you stand comfortably? knee with give, seems to spasm    How long can you walk comfortably? gives with walking     Patient Stated Goals strengthen knee, improve stairs    Currently in Pain? Yes   Pain Score 6    Pain Location Knee   Pain Orientation Left   Pain Descriptors / Indicators Sharp   Pain Type Acute pain   Pain Onset 1 to 4 weeks ago   Pain Frequency Intermittent   Aggravating Factors  stairs, walking   Pain Relieving Factors ice, elevating knee, limiting activity   Effect of Pain on Daily Activities severe limit            [facility] PT Assessment - 03/25/17 0001      Palpation   Palpation comment L patella increased instability noted with medial-lateral mobility assessment, some discomfort noted with medial mobs     Special Tests    Special Tests Knee Special Tests   Knee Special tests  other;other2     other    Findings Negative   Side  Left   Comments Thessaly's     other   findings Negative   Side Left   Comments MCL, LCL, ACL, PCL         [facility] Adult PT Treatment/Exercise - 03/25/17 0001      Exercises   Exercises Knee/Hip  Knee/Hip Exercises: Seated   Sit to Sand 1 set;5 reps;without UE support     Knee/Hip Exercises: Supine   Quad Sets Left;1 set;10 reps   Heel Slides AAROM;Left;1 set;15 reps   Heel Slides Limitations min cues for proper technique     Manual Therapy   Manual Therapy Edema management;Soft tissue mobilization   Manual therapy comments complete separate rest of treatment   Edema Management retro massage with LE elevated   Soft tissue mobilization cross friction and efflurage to distal VLO, VMO, lateral HS, medial knee joint region                PT Short Term Goals - 03/22/17 1613      PT SHORT TERM GOAL #1   Title patient to demonstrate ROM in B knees as being equal in order to reduce pain and improve gross mechanics    Time 2   Period Weeks   Status New     PT SHORT TERM GOAL #2   Title Patient to experience pain no more than 3/10 in order to improve QOL and activity tolerance    Time 2   Period Weeks   Status New      PT SHORT TERM GOAL #3   Title patient to demonstrate improved gait mechanics to include improved ROM L knee, elimination of antalgic gait pattern, reduced pronation L in order to reduce pain and improve tolerance to regular activities    Time 2   Period Weeks   Status New     PT SHORT TERM GOAL #4   Title Patient to correctly and consistently perform appropraite HEP, to be updated PRN    Time 1   Period Weeks   Status New           PT Long Term Goals - 03/22/17 1615      PT LONG TERM GOAL #1   Title Patient to demonstrate functional strength at least 4+/5 in all tested muscle groups in order to reduce pain and improve gross mechanics    Time 4   Period Weeks   Status New     PT LONG TERM GOAL #2   Title Patient to be able to tolerate at least 1/2 work shift with no pain exacerbation in order to facilitate return to PLOF    Time 4   Period Weeks   Status New     PT LONG TERM GOAL #3   Title patient to be able to ascend/descend full flight of stairs with reciprocal pattern, good eccentric control, and minimal unsteadiness in order to improve QOL and activity tolernace    Time 4   Period Weeks   Status New     PT LONG TERM GOAL #4   Title Patient to be participatory in regular exercise program, at least 20 minutes in duration and at least 3 days per week, in order to maintain functional gains and improve general health status moving forward    Time 4   Period Weeks   Status New               Plan - 03/25/17 1123    Clinical Impression Statement Session limited as pt arrived late. Reviewed initial goals and pt had no follow-up questions. Pt verbalized compliance with HEP as well. Performed ligament and meniscus testing at beginning of session and all tests were negative. Pt's patella noted to have increased instability with medial/lateral mobs, and clicking noted when pushing patella medially. Pt had  some pain/discomfort during assessment of patella. She had  increased soft tissue restrictions of distal VLO, VMO, distal medial HS, and around medial patella and was mildly tender to palpation around her medial patella region. Performed manual for edema control and soft tissue restrictions which she tolerated well. Pt noted to have good quality quad contraction during quad sets. Continue POC as planned.    Rehab Potential Good   Clinical Impairments Affecting Rehab Potential (+) appears motivated to participate in PT, young age; (-) obesity, mixed results with PT for other knee, sedentary lifestyle    PT Frequency 2x / week   PT Duration 4 weeks   PT Treatment/Interventions ADLs/Self Care Home Management;Cryotherapy;DME Instruction;Gait training;Stair training;Functional mobility training;Therapeutic activities;Therapeutic exercise;Balance training;Neuromuscular re-education;Patient/family education;Orthotic Fit/Training;Manual techniques;Passive range of motion;Taping   PT Next Visit Plan get ROM of L knee equal to that of R, then work on functional strength. Edema control, gait training. Add hip ABD to HEP   PT Home Exercise Plan Eval quad sets, SLRs, bridges; 5/10: L heel slides    Consulted and Agree with Plan of Care Patient      Patient will benefit from skilled therapeutic intervention in order to improve the following deficits and impairments:  Abnormal gait, Pain, Decreased mobility, Decreased activity tolerance, Decreased range of motion, Decreased strength, Difficulty walking, Increased edema, Impaired flexibility  Visit Diagnosis: Acute pain of left knee  Muscle weakness (generalized)  Stiffness of left knee, not elsewhere classified  Difficulty in walking, not elsewhere classified  Other symptoms and signs involving the musculoskeletal system     Problem List Patient Active Problem List   Diagnosis Date Noted  . IBS (irritable bowel syndrome) 06/12/2013  . Obesity 06/12/2013  . Diverticulitis of colon (without mention of  hemorrhage)(562.11) 01/26/2013  . Abdominal pain, other specified site 01/12/2013  . Disc prolapse 04/20/2012  . Sciatica 01/25/2012     Geraldine Solar PT, DPT   [facility] [address] [place], [place], [address] Phone: [phone]   Fax:  [phone]  Name: JAQUELYNE FIRKUS MRN: [id number] Date of Birth: 1971/08/18

## 2017-03-30 ENCOUNTER — Ambulatory Visit (HOSPITAL_COMMUNITY): Payer: BC Managed Care – PPO | Admitting: Physical Therapy

## 2017-03-30 DIAGNOSIS — R29898 Other symptoms and signs involving the musculoskeletal system: Secondary | ICD-10-CM

## 2017-03-30 DIAGNOSIS — M25562 Pain in left knee: Secondary | ICD-10-CM | POA: Diagnosis not present

## 2017-03-30 DIAGNOSIS — M25662 Stiffness of left knee, not elsewhere classified: Secondary | ICD-10-CM

## 2017-03-30 DIAGNOSIS — R262 Difficulty in walking, not elsewhere classified: Secondary | ICD-10-CM

## 2017-03-30 DIAGNOSIS — M6281 Muscle weakness (generalized): Secondary | ICD-10-CM

## 2017-03-30 NOTE — Patient Instructions (Signed)
   HIP ABDUCTION - SIDELYING  While lying on your side, slowly raise up your top leg to the side. Keep your knee straight and maintain your toes pointed forward the entire time. Keep your leg in-line with your body.  The bottom leg can be bent to stabilize your body. Do not let your hips roll backwards- try to keep your hips stacked.   Repeat 10-15 times each leg, twice a day.

## 2017-03-30 NOTE — Therapy (Signed)
[place], [place], [address] Phone: [phone]   Fax:  [phone]  Physical Therapy Treatment  Patient Details  Name: Rebecca Odom MRN: [id number] Date of Birth: [birth year] Referring Provider: Arther Abbott   Encounter Date: 03/30/2017      PT End of Session - 03/30/17 0941    Visit Number 3   Number of Visits 9   Date for PT Re-Evaluation 04/19/17   Authorization Type [place] and [employer] Other    Authorization Time Period 03/22/17 to 04/22/17   PT Start Time 0902   PT Stop Time 0940   PT Time Calculation (min) 38 min   Activity Tolerance Patient tolerated treatment well   Behavior During Therapy [facility] for tasks assessed/performed      Past Medical History:  Diagnosis Date  . Bulging lumbar disc   . Chronic abdominal pain   . Chronic back pain   . Chronic diarrhea   . Chronic nausea   . Diverticulosis   . Headache   . IBS (irritable bowel syndrome)   . Kidney stones   . Sciatica     Past Surgical History:  Procedure Laterality Date  . CHOLECYSTECTOMY  2004   [facility]-Dr. Arnoldo Morale  . COLONOSCOPY N/A 03/06/2013   Procedure: COLONOSCOPY;  Surgeon: Rogene Houston, MD;  [facility];  Service: Endoscopy;  Laterality: N/A;  1030  . ENDOMETRIAL ABLATION  June 2012    There were no vitals filed for this visit.      Subjective Assessment - 03/30/17 0904    Subjective Patient arrives stating that her L knee was really hurting Saturday but once it popped it felt better; she also had the same thing happen Sunday where it popped and felt better. Nothing else going on.    Pertinent History IBS, sciatica, hx of back pain    Patient Stated Goals strengthen knee, improve stairs    Currently in Pain? Yes   Pain Score 7   no signs of physical distress that would be consistent with high NPRS rating    Pain Location Knee   Pain Orientation Left            [facility] PT Assessment - 03/30/17  0001      AROM   Left Knee Extension -2   Left Knee Flexion 107                     [facility] Adult PT Treatment/Exercise - 03/30/17 0001      Knee/Hip Exercises: Standing   Heel Raises Both;1 set;15 reps   Heel Raises Limitations toe and heel    Forward Lunges Both;1 set;10 reps   Forward Lunges Limitations 6 inch box    Lateral Step Up Both;1 set;10 reps   Lateral Step Up Limitations 4 inch box    Forward Step Up Both;1 set;10 reps   Forward Step Up Limitations 4 inch box      Knee/Hip Exercises: Supine   Quad Sets Left;1 set;15 reps   Quad Sets Limitations 3 second holds    Short Arc Quad Sets Left;1 set;15 reps   Heel Slides Left;1 set;15 reps   Heel Slides Limitations 3 second holds    Bridges Limitations 1x15      Knee/Hip Exercises: Sidelying   Hip ABduction Both;1 set;10 reps   Hip ABduction Limitations cues for form, HEP review      Manual Therapy   Manual Therapy  Edema management;Soft tissue mobilization   Manual therapy comments complete separate rest of treatment   Edema Management retro massage with LE elevated   Soft tissue mobilization deep STM to all quad musculature                 PT Education - 03/30/17 0940    Education provided Yes   Education Details knee ROM progress, HEP update    Person(s) Educated Patient   Methods Explanation;Handout   Comprehension Verbalized understanding;Need further instruction;Returned demonstration          PT Short Term Goals - 03/22/17 1613      PT SHORT TERM GOAL #1   Title patient to demonstrate ROM in B knees as being equal in order to reduce pain and improve gross mechanics    Time 2   Period Weeks   Status New     PT SHORT TERM GOAL #2   Title Patient to experience pain no more than 3/10 in order to improve QOL and activity tolerance    Time 2   Period Weeks   Status New     PT SHORT TERM GOAL #3   Title patient to demonstrate improved gait mechanics to include improved ROM L  knee, elimination of antalgic gait pattern, reduced pronation L in order to reduce pain and improve tolerance to regular activities    Time 2   Period Weeks   Status New     PT SHORT TERM GOAL #4   Title Patient to correctly and consistently perform appropraite HEP, to be updated PRN    Time 1   Period Weeks   Status New           PT Long Term Goals - 03/22/17 1615      PT LONG TERM GOAL #1   Title Patient to demonstrate functional strength at least 4+/5 in all tested muscle groups in order to reduce pain and improve gross mechanics    Time 4   Period Weeks   Status New     PT LONG TERM GOAL #2   Title Patient to be able to tolerate at least 1/2 work shift with no pain exacerbation in order to facilitate return to PLOF    Time 4   Period Weeks   Status New     PT LONG TERM GOAL #3   Title patient to be able to ascend/descend full flight of stairs with reciprocal pattern, good eccentric control, and minimal unsteadiness in order to improve QOL and activity tolernace    Time 4   Period Weeks   Status New     PT LONG TERM GOAL #4   Title Patient to be participatory in regular exercise program, at least 20 minutes in duration and at least 3 days per week, in order to maintain functional gains and improve general health status moving forward    Time 4   Period Weeks   Status New               Plan - 03/30/17 0941    Clinical Impression Statement Continued with manual for edema and soft tissue limitations in L LE today, however suspect that some of apparent bogginess around knee may be adipose related due to similar appearance R knee, recommend focus on STM moving forward. Continued with ROM exercises and exercises for general strength of knee and LE CKC musculature in general today with patient reporting she is quite pain limited throughout her day/with high NPRS  rating however not showing significant signs of distress at session today. Checked ROM of left knee and  found it to be generally equivalent to that of R and now recommend transition to more aggressive strengthening moving forward.    Rehab Potential Good   Clinical Impairments Affecting Rehab Potential (+) appears motivated to participate in PT, young age; (-) obesity, mixed results with PT for other knee, sedentary lifestyle    PT Frequency 2x / week   PT Duration 4 weeks   PT Treatment/Interventions ADLs/Self Care Home Management;Cryotherapy;DME Instruction;Gait training;Stair training;Functional mobility training;Therapeutic activities;Therapeutic exercise;Balance training;Neuromuscular re-education;Patient/family education;Orthotic Fit/Training;Manual techniques;Passive range of motion;Taping   PT Next Visit Plan continue progressing functional strength as ROM is now generally equal. STM to quads L    PT Home Exercise Plan Eval quad sets, SLRs, bridges; 5/10: L heel slides 5/15: hip ABD sidelying    Consulted and Agree with Plan of Care Patient      Patient will benefit from skilled therapeutic intervention in order to improve the following deficits and impairments:  Abnormal gait, Pain, Decreased mobility, Decreased activity tolerance, Decreased range of motion, Decreased strength, Difficulty walking, Increased edema, Impaired flexibility  Visit Diagnosis: Acute pain of left knee  Muscle weakness (generalized)  Stiffness of left knee, not elsewhere classified  Difficulty in walking, not elsewhere classified  Other symptoms and signs involving the musculoskeletal system     Problem List Patient Active Problem List   Diagnosis Date Noted  . IBS (irritable bowel syndrome) 06/12/2013  . Obesity 06/12/2013  . Diverticulitis of colon (without mention of hemorrhage)(562.11) 01/26/2013  . Abdominal pain, other specified site 01/12/2013  . Disc prolapse 04/20/2012  . Sciatica 01/25/2012    Deniece Ree PT, DPT [place] [address] [place], [place], [address] Phone: [phone]   Fax:  [phone]  Name: Rebecca Odom MRN: [id number] Date of Birth: 1971-08-03

## 2017-04-01 ENCOUNTER — Ambulatory Visit (HOSPITAL_COMMUNITY): Payer: BC Managed Care – PPO | Admitting: Physical Therapy

## 2017-04-01 DIAGNOSIS — M6281 Muscle weakness (generalized): Secondary | ICD-10-CM

## 2017-04-01 DIAGNOSIS — M25562 Pain in left knee: Secondary | ICD-10-CM

## 2017-04-01 DIAGNOSIS — M25662 Stiffness of left knee, not elsewhere classified: Secondary | ICD-10-CM

## 2017-04-01 DIAGNOSIS — R29898 Other symptoms and signs involving the musculoskeletal system: Secondary | ICD-10-CM

## 2017-04-01 DIAGNOSIS — R262 Difficulty in walking, not elsewhere classified: Secondary | ICD-10-CM

## 2017-04-01 NOTE — Therapy (Signed)
[place], [place], [address] Phone: [phone]   Fax:  [phone]  Physical Therapy Treatment  Patient Details  Name: Rebecca Odom MRN: [id number] Date of Birth: Dec 25, 1970 Referring Provider: Arther Abbott   Encounter Date: 04/01/2017      PT End of Session - 04/01/17 0941    Visit Number 4   Number of Visits 9   Date for PT Re-Evaluation 04/19/17   Authorization Type [place] and [employer] Other    Authorization Time Period 03/22/17 to 04/22/17   PT Start Time 0901   PT Stop Time 0940   PT Time Calculation (min) 39 min   Activity Tolerance Patient tolerated treatment well   Behavior During Therapy [facility] for tasks assessed/performed      Past Medical History:  Diagnosis Date  . Bulging lumbar disc   . Chronic abdominal pain   . Chronic back pain   . Chronic diarrhea   . Chronic nausea   . Diverticulosis   . Headache   . IBS (irritable bowel syndrome)   . Kidney stones   . Sciatica     Past Surgical History:  Procedure Laterality Date  . CHOLECYSTECTOMY  2004   [facility]-Dr. Arnoldo Morale  . COLONOSCOPY N/A 03/06/2013   Procedure: COLONOSCOPY;  Surgeon: Rogene Houston, MD;  [facility];  Service: Endoscopy;  Laterality: N/A;  1030  . ENDOMETRIAL ABLATION  June 2012    There were no vitals filed for this visit.      Subjective Assessment - 04/01/17 0902    Subjective Patient arrives today reporting that she was quite sore after last session however it was more of a "muscles being stretched and worked" feeling than pain, she is OK today    Pertinent History IBS, sciatica, hx of back pain    Patient Stated Goals strengthen knee, improve stairs    Currently in Pain? Yes   Pain Score 5    Pain Location Knee   Pain Orientation Left   Pain Descriptors / Indicators Sharp   Pain Type Acute pain   Pain Radiating Towards sometimes shoots down to toes    Pain Onset More than a month ago   Pain Frequency Intermittent   Aggravating Factors  stairs, walking   Pain Relieving Factors ice, elevating knee, limiting activity    Effect of Pain on Daily Activities severe limits                          [facility] Adult PT Treatment/Exercise - 04/01/17 0001      Knee/Hip Exercises: Stretches   Active Hamstring Stretch Both;2 reps;30 seconds   Active Hamstring Stretch Limitations stairs    Quad Stretch 2 reps;30 seconds   Quad Stretch Limitations prone    Gastroc Stretch Both;3 reps;30 seconds   Gastroc Stretch Limitations slantboard      Knee/Hip Exercises: Standing   Heel Raises Both;1 set;20 reps   Heel Raises Limitations toe and heel    Lateral Step Up Both;1 set;10 reps   Lateral Step Up Limitations 4 inch box    Forward Step Up Both;1 set;10 reps   Forward Step Up Limitations 4 inch box    Rocker Board 2 minutes   Rocker Board Limitations AP and lateral    Other Standing Knee Exercises sit to stands UEs clasped either side 2x10    Other Standing Knee Exercises 3D hip excursions 1x10  Manual Therapy   Manual Therapy Soft tissue mobilization   Manual therapy comments complete separate rest of treatment   Soft tissue mobilization STM assisted by ball L quads                 PT Education - 04/01/17 0941    Education provided Yes   Education Details DOMS following strengthening/challenging exercises    Person(s) Educated Patient   Methods Explanation   Comprehension Verbalized understanding          PT Short Term Goals - 03/22/17 1613      PT SHORT TERM GOAL #1   Title patient to demonstrate ROM in B knees as being equal in order to reduce pain and improve gross mechanics    Time 2   Period Weeks   Status New     PT SHORT TERM GOAL #2   Title Patient to experience pain no more than 3/10 in order to improve QOL and activity tolerance    Time 2   Period Weeks   Status New     PT SHORT TERM GOAL #3   Title patient to demonstrate  improved gait mechanics to include improved ROM L knee, elimination of antalgic gait pattern, reduced pronation L in order to reduce pain and improve tolerance to regular activities    Time 2   Period Weeks   Status New     PT SHORT TERM GOAL #4   Title Patient to correctly and consistently perform appropraite HEP, to be updated PRN    Time 1   Period Weeks   Status New           PT Long Term Goals - 03/22/17 1615      PT LONG TERM GOAL #1   Title Patient to demonstrate functional strength at least 4+/5 in all tested muscle groups in order to reduce pain and improve gross mechanics    Time 4   Period Weeks   Status New     PT LONG TERM GOAL #2   Title Patient to be able to tolerate at least 1/2 work shift with no pain exacerbation in order to facilitate return to PLOF    Time 4   Period Weeks   Status New     PT LONG TERM GOAL #3   Title patient to be able to ascend/descend full flight of stairs with reciprocal pattern, good eccentric control, and minimal unsteadiness in order to improve QOL and activity tolernace    Time 4   Period Weeks   Status New     PT LONG TERM GOAL #4   Title Patient to be participatory in regular exercise program, at least 20 minutes in duration and at least 3 days per week, in order to maintain functional gains and improve general health status moving forward    Time 4   [place] - 04/01/17 3474    Clinical Impression Statement Patient arrives today reporting that her knee was painful after last session however upon further questioning this appears to be more related to muscle soreness after progressed exercises than true pain. Continued with progression of functional exercises as tolerated today in CKC positions as well as [employer] work and increased focus on functional stretching today.    Rehab Potential Good   Clinical Impairments Affecting Rehab Potential (+) appears motivated to participate in PT,  young  age; (-) obesity, mixed results with PT for other knee, sedentary lifestyle    PT Frequency 2x / week   PT Duration 4 weeks   PT Treatment/Interventions ADLs/Self Care Home Management;Cryotherapy;DME Instruction;Gait training;Stair training;Functional mobility training;Therapeutic activities;Therapeutic exercise;Balance training;Neuromuscular re-education;Patient/family education;Orthotic Fit/Training;Manual techniques;Passive range of motion;Taping   PT Next Visit Plan continue to progress functional strength and stretching. STM to L quads. Update HEP    PT Home Exercise Plan Eval quad sets, SLRs, bridges; 5/10: L heel slides 5/15: hip ABD sidelying    Consulted and Agree with Plan of Care Patient      Patient will benefit from skilled therapeutic intervention in order to improve the following deficits and impairments:  Abnormal gait, Pain, Decreased mobility, Decreased activity tolerance, Decreased range of motion, Decreased strength, Difficulty walking, Increased edema, Impaired flexibility  Visit Diagnosis: Acute pain of left knee  Muscle weakness (generalized)  Stiffness of left knee, not elsewhere classified  Difficulty in walking, not elsewhere classified  Other symptoms and signs involving the musculoskeletal system     Problem List Patient Active Problem List   Diagnosis Date Noted  . IBS (irritable bowel syndrome) 06/12/2013  . Obesity 06/12/2013  . Diverticulitis of colon (without mention of hemorrhage)(562.11) 01/26/2013  . Abdominal pain, other specified site 01/12/2013  . Disc prolapse 04/20/2012  . Sciatica 01/25/2012    Deniece Ree PT, DPT [place] [address] [place], [place], [address] Phone: [phone]   Fax:  [phone]  Name: Rebecca Odom MRN: [id number] Date of Birth: May 18, 1971

## 2017-04-06 ENCOUNTER — Ambulatory Visit (HOSPITAL_COMMUNITY): Payer: BC Managed Care – PPO | Admitting: Physical Therapy

## 2017-04-06 ENCOUNTER — Telehealth (HOSPITAL_COMMUNITY): Payer: Self-pay | Admitting: Physical Therapy

## 2017-04-06 NOTE — Telephone Encounter (Signed)
Pt woke up sick this morning and will not be here

## 2017-04-07 ENCOUNTER — Encounter: Payer: Self-pay | Admitting: Orthopedic Surgery

## 2017-04-08 ENCOUNTER — Ambulatory Visit (HOSPITAL_COMMUNITY): Payer: BC Managed Care – PPO

## 2017-04-08 DIAGNOSIS — R262 Difficulty in walking, not elsewhere classified: Secondary | ICD-10-CM

## 2017-04-08 DIAGNOSIS — M25662 Stiffness of left knee, not elsewhere classified: Secondary | ICD-10-CM

## 2017-04-08 DIAGNOSIS — R29898 Other symptoms and signs involving the musculoskeletal system: Secondary | ICD-10-CM

## 2017-04-08 DIAGNOSIS — M6281 Muscle weakness (generalized): Secondary | ICD-10-CM

## 2017-04-08 DIAGNOSIS — M25562 Pain in left knee: Secondary | ICD-10-CM | POA: Diagnosis not present

## 2017-04-08 NOTE — Therapy (Signed)
[place], [place], [address] Phone: [phone]   Fax:  [phone]  Physical Therapy Treatment  Patient Details  Name: Rebecca Odom MRN: [id number] Date of Birth: January 16, 1971 Referring Provider: Arther Abbott  Encounter Date: 04/08/2017      PT End of Session - 04/08/17 0915    Visit Number 5   Number of Visits 9   Date for PT Re-Evaluation 04/19/17   Authorization Type [place] Time Period 03/22/17 to 04/22/17   PT Start Time 0907   PT Stop Time 0948   PT Time Calculation (min) 41 min   Activity Tolerance Patient tolerated treatment well;No increased pain   Behavior During Therapy [facility] for tasks assessed/performed      Past Medical History:  Diagnosis Date  . Bulging lumbar disc   . Chronic abdominal pain   . Chronic back pain   . Chronic diarrhea   . Chronic nausea   . Diverticulosis   . Headache   . IBS (irritable bowel syndrome)   . Kidney stones   . Sciatica     Past Surgical History:  Procedure Laterality Date  . CHOLECYSTECTOMY  2004   [facility]-Dr. Arnoldo Morale  . COLONOSCOPY N/A 03/06/2013   Procedure: COLONOSCOPY;  Surgeon: Rogene Houston, MD;  [facility];  Service: Endoscopy;  Laterality: N/A;  1030  . ENDOMETRIAL ABLATION  June 2012    There were no vitals filed for this visit.      Subjective Assessment - 04/08/17 0911    Subjective Pt stated her whole boby has an ache no real pain in knee.  Main concern with knee with popping, sometimes a relief and other times painful.  Reports feeling better and has been removing brace more often.   Pertinent History IBS, sciatica, hx of back pain    Patient Stated Goals strengthen knee, improve stairs    Currently in Pain? No/denies   Pain Descriptors / Indicators Aching            [facility] PT Assessment - 04/08/17 0001      Assessment   Medical Diagnosis actue L knee pain    Referring  Provider Arther Abbott   Next MD Visit [facility] April 16, 2017   Prior Therapy PT for her her other knee                      [facility] Adult PT Treatment/Exercise - 04/08/17 0001      Knee/Hip Exercises: Standing   Heel Raises Both;1 set;20 reps   Heel Raises Limitations toe and heel    Forward Lunges Both;1 set;10 reps   Forward Lunges Limitations 6 inch box    Lateral Step Up Both;1 set;10 reps   Lateral Step Up Limitations 4 inch box    Forward Step Up Both;1 set;10 reps   Forward Step Up Limitations 4 inch box    Functional Squat 2 sets;10 reps   Functional Squat Limitations mod cueing for form   Rocker Board 2 minutes   Rocker Board Limitations AP and lateral    Other Standing Knee Exercises sit to stands UEs clasped either side 2x10      Manual Therapy   Manual Therapy Soft tissue mobilization   Manual therapy comments complete separate rest of treatment   Soft tissue mobilization STM assisted by ball L quads  PT Short Term Goals - 03/22/17 1613      PT SHORT TERM GOAL #1   Title patient to demonstrate ROM in B knees as being equal in order to reduce pain and improve gross mechanics    Time 2   Period Weeks   Status New     PT SHORT TERM GOAL #2   Title Patient to experience pain no more than 3/10 in order to improve QOL and activity tolerance    Time 2   Period Weeks   Status New     PT SHORT TERM GOAL #3   Title patient to demonstrate improved gait mechanics to include improved ROM L knee, elimination of antalgic gait pattern, reduced pronation L in order to reduce pain and improve tolerance to regular activities    Time 2   Period Weeks   Status New     PT SHORT TERM GOAL #4   Title Patient to correctly and consistently perform appropraite HEP, to be updated PRN    Time 1   Period Weeks   Status New           PT Long Term Goals - 03/22/17 1615      PT LONG TERM GOAL #1   Title Patient to demonstrate  functional strength at least 4+/5 in all tested muscle groups in order to reduce pain and improve gross mechanics    Time 4   Period Weeks   Status New     PT LONG TERM GOAL #2   Title Patient to be able to tolerate at least 1/2 work shift with no pain exacerbation in order to facilitate return to PLOF    Time 4   Period Weeks   Status New     PT LONG TERM GOAL #3   Title patient to be able to ascend/descend full flight of stairs with reciprocal pattern, good eccentric control, and minimal unsteadiness in order to improve QOL and activity tolernace    Time 4   Period Weeks   Status New     PT LONG TERM GOAL #4   Title Patient to be participatory in regular exercise program, at least 20 minutes in duration and at least 3 days per week, in order to maintain functional gains and improve general health status moving forward    Time 4   Period Weeks   Status New               Plan - 04/08/17 1041    Clinical Impression Statement Session focus on improving mechanics for functional strengthening.  Pt reports knee popping continues to bother and reports decreased frequency.  Pt with tendency to hyperextend knee during functional exercises, pt explained benefits of reducing joint locking and improve musculature activation for strengthening, moderate cueing with CKC exercises.  Pt continues to exhibit edema proximal knee, educated benefits with compression hose while discussing RTW with increased standing.  Given [place] paperwork for compression hose.  EOS with manual to reduce tightness and spasms Lt quad.  No reports of pain through session.     Rehab Potential Good   Clinical Impairments Affecting Rehab Potential (+) appears motivated to participate in PT, young age; (-) obesity, mixed results with PT for other knee, sedentary lifestyle    PT Frequency 2x / week   PT Duration 4 weeks   PT Treatment/Interventions ADLs/Self Care Home Management;Cryotherapy;DME Instruction;Gait  training;Stair training;Functional mobility training;Therapeutic activities;Therapeutic exercise;Balance training;Neuromuscular re-education;Patient/family education;Orthotic Fit/Training;Manual techniques;Passive range of motion;Taping  PT Next Visit Plan F/U with compression hose.  Continue to progress functional strength and stretching. STM to L quads. Update HEP    PT Home Exercise Plan Eval quad sets, SLRs, bridges; 5/10: L heel slides 5/15: hip ABD sidelying       Patient will benefit from skilled therapeutic intervention in order to improve the following deficits and impairments:  Abnormal gait, Pain, Decreased mobility, Decreased activity tolerance, Decreased range of motion, Decreased strength, Difficulty walking, Increased edema, Impaired flexibility  Visit Diagnosis: Acute pain of left knee  Muscle weakness (generalized)  Stiffness of left knee, not elsewhere classified  Difficulty in walking, not elsewhere classified  Other symptoms and signs involving the musculoskeletal system     Problem List Patient Active Problem List   Diagnosis Date Noted  . IBS (irritable bowel syndrome) 06/12/2013  . Obesity 06/12/2013  . Diverticulitis of colon (without mention of hemorrhage)(562.11) 01/26/2013  . Abdominal pain, other specified site 01/12/2013  . Disc prolapse 04/20/2012  . Sciatica 01/25/2012   Ihor Austin, [place]; [place]  Aldona Lento 04/08/2017, 10:52 AM  [place] [address] [place], [place], [address] Phone: [phone]   Fax:  [phone]  Name: Rebecca Odom MRN: [id number] Date of Birth: 09-04-1971

## 2017-04-13 ENCOUNTER — Ambulatory Visit (HOSPITAL_COMMUNITY): Payer: BC Managed Care – PPO | Admitting: Physical Therapy

## 2017-04-13 DIAGNOSIS — R29898 Other symptoms and signs involving the musculoskeletal system: Secondary | ICD-10-CM

## 2017-04-13 DIAGNOSIS — R262 Difficulty in walking, not elsewhere classified: Secondary | ICD-10-CM

## 2017-04-13 DIAGNOSIS — M25562 Pain in left knee: Secondary | ICD-10-CM | POA: Diagnosis not present

## 2017-04-13 DIAGNOSIS — M25662 Stiffness of left knee, not elsewhere classified: Secondary | ICD-10-CM

## 2017-04-13 DIAGNOSIS — M6281 Muscle weakness (generalized): Secondary | ICD-10-CM

## 2017-04-13 NOTE — Therapy (Signed)
[facility] [address] [place], [place], [address] Phone: [phone]   Fax:  [phone]  Physical Therapy Treatment (Discharge)  Patient Details  Name: Rebecca Odom MRN: [id number] Date of Birth: 07-22-1971 Referring Provider: Arther Abbott   Encounter Date: 04/13/2017      PT End of Session - 04/13/17 0936    Visit Number 6   Number of Visits 6   Authorization Type [employer] and [employer] Other    Authorization Time Period 03/22/17 to 04/22/17   PT Start Time 0901   PT Stop Time 0931   PT Time Calculation (min) 30 min   Activity Tolerance Patient tolerated treatment well   Behavior During Therapy [facility] for tasks assessed/performed      Past Medical History:  Diagnosis Date  . Bulging lumbar disc   . Chronic abdominal pain   . Chronic back pain   . Chronic diarrhea   . Chronic nausea   . Diverticulosis   . Headache   . IBS (irritable bowel syndrome)   . Kidney stones   . Sciatica     Past Surgical History:  Procedure Laterality Date  . CHOLECYSTECTOMY  2004   [facility]-Dr. Arnoldo Morale  . COLONOSCOPY N/A 03/06/2013   Procedure: COLONOSCOPY;  Surgeon: Rogene Houston, MD;  [facility];  Service: Endoscopy;  Laterality: N/A;  1030  . ENDOMETRIAL ABLATION  June 2012    There were no vitals filed for this visit.      Subjective Assessment - 04/13/17 0904    Subjective Patient arrives stating that she has been working on wearing her brace less; her knee still pops sometimes but it is getting better. She is seeing her MD Friday and might go back to full time work next week before summer.    Pertinent History IBS, sciatica, hx of back pain    How long can you sit comfortably? 5/29- unlimited    How long can you stand comfortably? 5/29- 2-3 hours    How long can you walk comfortably? 5/29- unlimited recently    Patient Stated Goals strengthen knee, improve stairs    Currently in Pain? Yes   Pain Score 4    Pain  Location Knee   Pain Orientation Left;Right   Pain Descriptors / Indicators Tightness   Pain Type Acute pain   Pain Onset More than a month ago   Pain Frequency Intermittent   Aggravating Factors  nothing    Pain Relieving Factors ice   Effect of Pain on Daily Activities not a lot             [facility] PT Assessment - 04/13/17 0001      Assessment   Medical Diagnosis actue L knee pain    Referring Provider Arther Abbott    Next MD Visit Aline Brochure April 16, 2017   Prior Therapy PT for her her other knee      Balance Screen   Has the patient fallen in the past 6 months No   Has the patient had a decrease in activity level because of a fear of falling?  Yes   Is the patient reluctant to leave their home because of a fear of falling?  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      AROM  Right Knee Extension -4   Right Knee Flexion 116   Left Knee Extension -5   Left Knee Flexion 114     Strength   Right Hip Flexion 5/5   Right Hip Extension 4/5   Right Hip ABduction 4+/5   Left Hip Flexion 5/5   Left Hip Extension 4/5   Left Hip ABduction 4+/5   Right Knee Flexion 5/5   Right Knee Extension 5/5   Left Knee Flexion 5/5   Left Knee Extension 5/5   Right Ankle Dorsiflexion 5/5   Left Ankle Dorsiflexion 5/5     Ambulation/Gait   Gait Comments hip hike LE but overall mproved gait pattern                      [facility] Adult PT Treatment/Exercise - 04/13/17 0001      Knee/Hip Exercises: Standing   Heel Raises Both;1 set;15 reps   Heel Raises Limitations U for heel raises, toe raises on incline                 PT Education - 04/13/17 0935    Education provided Yes   Education Details progress with skilled PT services, DC today, HEP updates, exercise program independently     Person(s) Educated Patient   Methods Explanation;Handout   Comprehension Verbalized understanding          PT Short Term Goals - 04/13/17 0917      PT SHORT TERM GOAL #1   Title patient to demonstrate ROM in B knees as being equal in order to reduce pain and improve gross mechanics    Time 2   Period Weeks   Status Achieved     PT SHORT TERM GOAL #2   Title Patient to experience pain no more than 3/10 in order to improve QOL and activity tolerance    Baseline 5/29- 4/10 today, maybe a 6/10 at worst but this is not often; current pain possibly related to rainy weather    Time 2   Period Weeks   Status Partially Met     PT SHORT TERM GOAL #3   Title patient to demonstrate improved gait mechanics to include improved ROM L knee, elimination of antalgic gait pattern, reduced pronation L in order to reduce pain and improve tolerance to regular activities    Time 2   Period Weeks   Status Partially Met     PT SHORT TERM GOAL #4   Title Patient to correctly and consistently perform appropraite HEP, to be updated PRN    Baseline 5/29- reports compliance    Time 1   Period Weeks   Status Achieved           PT Long Term Goals - 04/13/17 4034      PT LONG TERM GOAL #1   Title Patient to demonstrate functional strength at least 4+/5 in all tested muscle groups in order to reduce pain and improve gross mechanics    Baseline 5/29- hip extensors 4/5    Time 4   Period Weeks   Status Partially Met     PT LONG TERM GOAL #2   Title Patient to be able to tolerate at least 1/2 work shift with no pain exacerbation in order to facilitate return to PLOF    Baseline 5/29- has been up on her feet a lot more, mostly has had exhaustion rather than pain   Time 4   Period Weeks   Status Partially  Met     PT LONG TERM GOAL #3   Title patient to be able to ascend/descend full flight of stairs with reciprocal pattern, good eccentric control, and minimal unsteadiness in order to improve  QOL and activity tolernace    Baseline 5/29- ongoing significant difficulty with stairs    Time 4   Period Weeks   Status On-going     PT LONG TERM GOAL #4   Title Patient to be participatory in regular exercise program, at least 20 minutes in duration and at least 3 days per week, in order to maintain functional gains and improve general health status moving forward    Baseline 5/29- discussed    Time 4   Period Weeks   Status On-going               Plan - 04/13/17 3007    Clinical Impression Statement Re-assessment performed today due to patient reports of much improved functional status. Examination reveals good progress with skilled PT services, with main remaining impairments being mild functional weakness, reduced activity tolerance, and difficulty with stair navigation. Patient has met the majority of her goals and seems appropriate for DC to independent exercise program at this time. DC today due to high level of function.    Rehab Potential Good   Clinical Impairments Affecting Rehab Potential (+) appears motivated to participate in PT, young age; (-) obesity, mixed results with PT for other knee, sedentary lifestyle    PT Next Visit Plan DC today    PT Home Exercise Plan Eval quad sets, SLRs, bridges; 5/10: L heel slides 5/15: hip ABD sidelying; 5/29: lunges, squats, hip hikse, independent exercise program    Consulted and Agree with Plan of Care Patient      Patient will benefit from skilled therapeutic intervention in order to improve the following deficits and impairments:  Abnormal gait, Pain, Decreased mobility, Decreased activity tolerance, Decreased range of motion, Decreased strength, Difficulty walking, Increased edema, Impaired flexibility  Visit Diagnosis: Acute pain of left knee  Muscle weakness (generalized)  Stiffness of left knee, not elsewhere classified  Difficulty in walking, not elsewhere classified  Other symptoms and signs involving the  musculoskeletal system     Problem List Patient Active Problem List   Diagnosis Date Noted  . IBS (irritable bowel syndrome) 06/12/2013  . Obesity 06/12/2013  . Diverticulitis of colon (without mention of hemorrhage)(562.11) 01/26/2013  . Abdominal pain, other specified site 01/12/2013  . Disc prolapse 04/20/2012  . Sciatica 01/25/2012   PHYSICAL THERAPY DISCHARGE SUMMARY  Visits from Start of Care: 6  Current functional level related to goals / functional outcomes: Patient making good progress with skilled PT services with main remaining deficits being mild functional weakness, difficulty with stairs, and reduced functional activity tolerance. Patient has met or partially met the majority of her goals. Recommend DC at this time.    Remaining deficits: Mild functional weakness, reduced functional activity tolerance, difficulty with stairs    Education / Equipment: progress with skilled PT services, DC today, HEP updates, exercise program independently    Plan: Patient agrees to discharge.  Patient goals were partially met. Patient is being discharged due to being pleased with the current functional level.  ?????      Deniece Ree PT, DPT [facility] [address] [place], [place], [address] Phone: [phone]   Fax:  [phone]  Name: Rebecca Odom MRN: [id number] Date of Birth: March 06, 1971

## 2017-04-13 NOTE — Patient Instructions (Signed)
   LUNGE FORWARD - BOX (YOU DO NOT NEED TO GO AS LOW AS THE FELLOW IN THE PICTURE)  While standing on the ground with a step in front of you, place your foot foward and onto a step as shown.    Allow your front and back knees to bend as you lower your back knee towards the ground into a lunge position. Do not allow your front knee to pass your toes.   Return to the original position and then perform with the other leg.   Repeat 10-15 times, twice a day.     HIP HIKES  While standing up on a step, lower one leg downward towards the floor by tilting your pelvis to the side.   Then return the pelvis/leg back to a leveled position.  Repeat 10-15 times each leg, twice a day.    SQUAT - CHAIR AS GUIDE  While standing with feet shoulder width apart and in front of a chair that is facing you, bend your knees and lower your body towards the floor. The chair seat is a guide so that your knees do not pass over your toes.   Your body weight should mostly be directed through the heels of your feet. Return to a standing position.   Knees should bend in line with the 2nd toe and not pass beyond the toes.  Repeat 10-15 times, twice a day; make sure you are not shifting away from your right leg.

## 2017-04-15 ENCOUNTER — Ambulatory Visit (HOSPITAL_COMMUNITY): Payer: BC Managed Care – PPO | Admitting: Physical Therapy

## 2017-04-16 ENCOUNTER — Encounter: Payer: Self-pay | Admitting: Orthopedic Surgery

## 2017-04-16 ENCOUNTER — Ambulatory Visit (INDEPENDENT_AMBULATORY_CARE_PROVIDER_SITE_OTHER): Payer: BC Managed Care – PPO | Admitting: Orthopedic Surgery

## 2017-04-16 DIAGNOSIS — S83002D Unspecified subluxation of left patella, subsequent encounter: Secondary | ICD-10-CM | POA: Diagnosis not present

## 2017-04-16 DIAGNOSIS — M25562 Pain in left knee: Secondary | ICD-10-CM | POA: Diagnosis not present

## 2017-04-16 NOTE — Patient Instructions (Signed)
Return to work mon

## 2017-04-16 NOTE — Progress Notes (Signed)
Patient ID: Rebecca Odom, female   DOB: 1971/03/12, 46 y.o.   MRN: [id number]  Chief Complaint  Patient presents with  . Follow-up    LEFT KNEE PAIN S/P THERAPY    46 year old female with known osteoarthritis in BMI close to 40 previously evaluated for osteoarthritis was scheduled for six-month follow-up. On Saturday, April 26 she was walking at her job turned a corner left knee buckled she presented to medical facility for pain swelling and trouble weightbearing. She complains of diffuse anterior knee pain medial and lateral seems to be more PERI-patellar area  We recommended:  Recommend continue naproxen and tramadol.   Physical therapy 4 weeks   Out of work 4 weeks   Wear brace at all times except for sleeping and bathing   She responded to this well she has some popping but no catching locking giving way in her pain is less and her swelling and loss of motion have returned to normal ROS  The patient is having some peripheral edema which is intermittently chronic for her  There were no vitals taken for this visit. Gen. appearance is normal grooming and hygiene Orientation to person place and time normal Mood normal Gait is normal   Sensory exam shows normal sensation to palpation, pressure and soft touch Skin exam no lacerations ulcerations or erythema  Ortho Exam Knee full range of motion stable in extension and flexion  A/P  Medical decision-making  Encounter Diagnoses  Name Primary?  . Acute pain of left knee Yes  . Subluxation of left patella, subsequent encounter    Return to work Monday Follow-up as needed  Arther Abbott, MD 04/16/2017 10:37 AM

## 2017-04-20 ENCOUNTER — Encounter (HOSPITAL_COMMUNITY): Payer: BC Managed Care – PPO | Admitting: Physical Therapy

## 2017-04-22 ENCOUNTER — Encounter (HOSPITAL_COMMUNITY): Payer: BC Managed Care – PPO | Admitting: Physical Therapy

## 2017-04-27 ENCOUNTER — Encounter: Payer: Self-pay | Admitting: Orthopedic Surgery

## 2017-04-27 ENCOUNTER — Ambulatory Visit (INDEPENDENT_AMBULATORY_CARE_PROVIDER_SITE_OTHER): Payer: BC Managed Care – PPO | Admitting: Orthopedic Surgery

## 2017-04-27 ENCOUNTER — Encounter (HOSPITAL_COMMUNITY): Payer: BC Managed Care – PPO

## 2017-04-27 DIAGNOSIS — M23322 Other meniscus derangements, posterior horn of medial meniscus, left knee: Secondary | ICD-10-CM | POA: Diagnosis not present

## 2017-04-27 DIAGNOSIS — G8929 Other chronic pain: Secondary | ICD-10-CM | POA: Diagnosis not present

## 2017-04-27 DIAGNOSIS — M25562 Pain in left knee: Secondary | ICD-10-CM

## 2017-04-27 NOTE — Patient Instructions (Signed)
No work next 2 weeks   Ice the knee 3 x a day for 30 min   Continue naproxen for pain

## 2017-04-27 NOTE — Progress Notes (Signed)
Patient ID: Rebecca Odom, female   DOB: 12-Nov-1971, 46 y.o.   MRN: [id number]  Chief Complaint  Patient presents with  . Follow-up    left knee recurring pain and giving out    46 year old female with known osteoarthritis in BMI close to 40 previously evaluated for osteoarthritis was scheduled for six-month follow-up. On Saturday, April 26 she was walking at her job turned a corner left knee buckled she presented to medical facility for pain swelling and trouble weightbearing. She complains of diffuse anterior knee pain medial and lateral seems to be more PERI-patellar area  We treated her with naproxen, ice and activity modification. She did not improve in fact this morning she went to work the left knee gave way on her after several episodes of popping she presents back to the office earlier than scheduled appointment with acute anterior medial knee pain and giving way    Review of Systems  Constitutional: Negative for chills, fever and malaise/fatigue.  Musculoskeletal: Positive for joint pain.  Neurological: Negative for tingling and focal weakness.      There were no vitals taken for this visit. Gen. appearance is normal grooming and hygiene normal Orientation to person place and time normal Mood normal   Ortho Exam See prior note    A/P  Medical decision-making  Encounter Diagnoses  Name Primary?  . Chronic pain of left knee   . Derangement of posterior horn of medial meniscus of left knee Yes   No work next 2 weeks   Mri left knee  Arther Abbott, MD 04/27/2017 9:01 AM

## 2017-04-28 ENCOUNTER — Emergency Department (HOSPITAL_COMMUNITY)
Admission: EM | Admit: 2017-04-28 | Discharge: 2017-04-28 | Disposition: A | Discharge status: Home / Self Care | Payer: BC Managed Care – PPO | Attending: Emergency Medicine | Admitting: Emergency Medicine

## 2017-04-28 ENCOUNTER — Emergency Department (HOSPITAL_COMMUNITY): Payer: BC Managed Care – PPO

## 2017-04-28 ENCOUNTER — Encounter (HOSPITAL_COMMUNITY): Payer: Self-pay | Admitting: *Deleted

## 2017-04-28 DIAGNOSIS — M25462 Effusion, left knee: Secondary | ICD-10-CM | POA: Insufficient documentation

## 2017-04-28 DIAGNOSIS — I10 Essential (primary) hypertension: Secondary | ICD-10-CM | POA: Diagnosis not present

## 2017-04-28 DIAGNOSIS — M25562 Pain in left knee: Secondary | ICD-10-CM | POA: Diagnosis present

## 2017-04-28 DIAGNOSIS — R52 Pain, unspecified: Secondary | ICD-10-CM

## 2017-04-28 DIAGNOSIS — Z7982 Long term (current) use of aspirin: Secondary | ICD-10-CM | POA: Diagnosis not present

## 2017-04-28 DIAGNOSIS — Z79899 Other long term (current) drug therapy: Secondary | ICD-10-CM | POA: Insufficient documentation

## 2017-04-28 HISTORY — DX: Essential (primary) hypertension: I10

## 2017-04-28 HISTORY — DX: Other chronic pain: G89.29

## 2017-04-28 HISTORY — DX: Pain in unspecified knee: M25.569

## 2017-04-28 MED ORDER — BUTAMBEN-TETRACAINE-BENZOCAINE 2-2-14 % EX AERO
INHALATION_SPRAY | CUTANEOUS | Status: AC
  Filled 2017-04-28: qty 20

## 2017-04-28 MED ORDER — HYDROCODONE-ACETAMINOPHEN 5-325 MG PO TABS: 1.0000 | ORAL_TABLET | Freq: Four times a day (QID) | ORAL | 0 refills | Status: DC | PRN

## 2017-04-28 MED ORDER — HYDROMORPHONE HCL 1 MG/ML IJ SOLN
1.0000 mg | Freq: Once | INTRAMUSCULAR | Status: AC
  Administered 2017-04-28: 1 mg via INTRAVENOUS
  Filled 2017-04-28: qty 1

## 2017-04-28 MED ORDER — ONDANSETRON HCL 4 MG/2ML IJ SOLN
4.0000 mg | Freq: Once | INTRAMUSCULAR | Status: AC
  Administered 2017-04-28: 4 mg via INTRAVENOUS
  Filled 2017-04-28: qty 2

## 2017-04-28 MED ORDER — OXYCODONE-ACETAMINOPHEN 5-325 MG PO TABS: 1.0000 | ORAL_TABLET | Freq: Four times a day (QID) | ORAL | 0 refills | Status: DC | PRN

## 2017-04-28 NOTE — ED Triage Notes (Signed)
Pt had and injury to her left knee on 4/30. She followed up with Dr. Aline Brochure yesterday, today she is having pain shooting down her lower leg. Pt states she was told she had severe arthritis in this knee. Today she began having right knee pain that makes it hard to stand on.

## 2017-04-28 NOTE — ED Notes (Signed)
Pt taken to MRI  

## 2017-04-28 NOTE — ED Notes (Signed)
Pt made aware to return if symptoms worsen or if any life threatening symptoms occur.   

## 2017-04-28 NOTE — Discharge Instructions (Signed)
Follow up with your md next week.  Keep leg elevated

## 2017-04-28 NOTE — ED Provider Notes (Signed)
[facility] DEPT Provider Note   CSN: [id number] Arrival date & time: 04/28/17  1304     History   Chief Complaint Chief Complaint  Patient presents with  . Knee Pain    HPI Rebecca Odom is a 46 y.o. female.  Patient hurt her knee yesterday. She was seen by her orthopedic doctor who arranged for her to get an MRI. She states it is more painful and swollen today   The history is provided by the patient. No language interpreter was used.  Knee Pain   This is a recurrent problem. The current episode started yesterday. The problem occurs constantly. The problem has not changed since onset.The pain is present in the left knee. The quality of the pain is described as aching. The pain is at a severity of 6/10. The pain is moderate. Associated symptoms include limited range of motion. The symptoms are aggravated by activity. She has tried nothing for the symptoms.    Past Medical History:  Diagnosis Date  . Bulging lumbar disc   . Chronic abdominal pain   . Chronic back pain   . Chronic diarrhea   . Chronic knee pain    bilateral  . Chronic nausea   . Diverticulosis   . Headache   . Hypertension   . IBS (irritable bowel syndrome)   . Kidney stones   . Sciatica     Patient Active Problem List   Diagnosis Date Noted  . IBS (irritable bowel syndrome) 06/12/2013  . Obesity 06/12/2013  . Diverticulitis of colon (without mention of hemorrhage)(562.11) 01/26/2013  . Abdominal pain, other specified site 01/12/2013  . Disc prolapse 04/20/2012  . Sciatica 01/25/2012    Past Surgical History:  Procedure Laterality Date  . CHOLECYSTECTOMY  2004   [facility]-Dr. Arnoldo Morale  . COLONOSCOPY N/A 03/06/2013   Procedure: COLONOSCOPY;  Surgeon: Rogene Houston, MD;  [facility];  Service: Endoscopy;  Laterality: N/A;  1030  . ENDOMETRIAL ABLATION  June 2012    OB History    Gravida Para Term Preterm AB Living   2 2 2     2    SAB TAB Ectopic Multiple Live Births         Home Medications    Prior to Admission medications   Medication Sig Start Date End Date Taking? Authorizing Provider  amLODipine (NORVASC) 10 MG tablet Take 10 mg by mouth daily.   Yes [provider]  aspirin 81 MG chewable tablet Chew by mouth daily.   Yes [provider]  hydrochlorothiazide (HYDRODIURIL) 25 MG tablet Take 25 mg by mouth daily.   Yes [provider]  naproxen (NAPROSYN) 500 MG tablet Take 1 tablet (500 mg total) by mouth 2 (two) times daily with a meal. 03/16/17  Yes Carole Civil, MD  propranolol ER (INDERAL LA) 60 MG 24 hr capsule Take 60 mg by mouth daily.   Yes [provider]  traMADol (ULTRAM) 50 MG tablet Take 1 tablet (50 mg total) by mouth every 6 (six) hours as needed. 03/16/17  Yes Carole Civil, MD  HYDROcodone-acetaminophen (NORCO/VICODIN) 5-325 MG tablet Take 1 tablet by mouth every 6 (six) hours as needed for moderate pain. 04/28/17   Milton Ferguson, MD  oxyCODONE-acetaminophen (PERCOCET/ROXICET) 5-325 MG tablet Take 1 tablet by mouth every 6 (six) hours as needed. 04/28/17   Milton Ferguson, MD    Family History Family History  Problem Relation Age of Onset  . Heart disease Mother  heart attack  . Alzheimer's disease Mother   . Cancer Father        prostate  . Heart disease Sister        CHF  . Diabetes Brother   . Cancer Brother        prostate  . Heart disease Brother   . Stroke Maternal Grandmother   . Heart disease Maternal Grandfather        massive heart attack  . Colon cancer Neg Hx   . Colon polyps Neg Hx     Social History Social History  Substance Use Topics  . Smoking status: Never Smoker  . Smokeless tobacco: Never Used  . Alcohol use No     Allergies   Bee venom and Vicodin [hydrocodone-acetaminophen]   Review of Systems Review of Systems  Constitutional: Negative for appetite change and fatigue.  HENT: Negative for congestion, ear discharge and sinus pressure.    Eyes: Negative for discharge.  Respiratory: Negative for cough.   Cardiovascular: Negative for chest pain.  Gastrointestinal: Negative for abdominal pain and diarrhea.  Genitourinary: Negative for frequency and hematuria.  Musculoskeletal: Negative for back pain.       Left knee swollen and painful  Skin: Negative for rash.  Neurological: Negative for seizures and headaches.  Psychiatric/Behavioral: Negative for hallucinations.     Physical Exam Updated Vital Signs BP (!) 171/94   Pulse 75   Temp 98.4 F (36.9 C) (Oral)   Resp 18   Ht 5\' 3"  (1.6 m)   Wt 107.5 kg (237 lb)   SpO2 100%   BMI 41.98 kg/m   Physical Exam  Constitutional: She is oriented to person, place, and time. She appears well-developed.  HENT:  Head: Normocephalic.  Eyes: Conjunctivae are normal.  Neck: No tracheal deviation present.  Cardiovascular:  No murmur heard. Musculoskeletal:  Tender swollen left knee. Neurovascular exams normal  Neurological: She is oriented to person, place, and time.  Skin: Skin is warm.  Psychiatric: She has a normal mood and affect.     ED Treatments / Results  Labs (all labs ordered are listed, but only abnormal results are displayed) Labs Reviewed - No data to display  EKG  EKG Interpretation None       Radiology Mr Knee Left Wo Contrast  Result Date: 04/28/2017 CLINICAL DATA:  47 year old with knee pain, swelling and difficulty weight-bearing after twisting injury approximately 7 weeks ago. No previous relevant surgery. EXAM: MRI OF THE LEFT KNEE WITHOUT CONTRAST TECHNIQUE: Multiplanar, multisequence MR imaging of the knee was performed. No intravenous contrast was administered. COMPARISON:  None. FINDINGS: Despite efforts by the technologist and patient, mild motion artifact is present on today's exam and could not be eliminated. This reduces exam sensitivity and specificity. Motion is greatest on the coronal T2 weighted images. MENISCI Medial meniscus:  Mild meniscal degeneration without evidence of tear or displaced fragment. Lateral meniscus: Mild degeneration. No evidence of tear or displaced fragment. LIGAMENTS Cruciates: Moderate ACL mucoid degeneration. The PCL appears normal. Collaterals: Intact. There is degenerative medial buckling of the MCL. CARTILAGE Patellofemoral: Advanced patellofemoral degenerative changes with diffuse chondral thinning and large osteophytes. Medial: Diffuse chondral thinning, surface irregularity and prominent osteophytes. Lateral: Mild chondral thinning and surface irregularity with prominent peripheral osteophytes. MISCELLANEOUS Joint: Small to moderate mildly complex joint effusion. There is a large ossified intra-articular loose body inferior to the patella, measuring up to 14 mm diameter on sagittal image 18. Popliteal Fossa: Moderate size septated Baker's cyst may  be partially ruptured. Extensor Mechanism:  Intact. Bones:  No acute or significant extra-articular osseous findings. Other: Mild soft tissue edema posteromedially without other focal fluid collection. IMPRESSION: 1. Age advanced tricompartmental osteoarthritis, most advanced in the patellofemoral compartment. There is a large intra-articular loose body inferior to the patella. 2. The menisci and ligaments appear intact. ACL mucoid degeneration. 3. No acute osseous findings. 4. Joint effusion and Baker's cyst. Electronically Signed   By: Richardean Sale M.D.   On: 04/28/2017 17:07    Procedures Procedures (including critical care time)  Medications Ordered in ED Medications  HYDROmorphone (DILAUDID) injection 1 mg (1 mg Intravenous Given 04/28/17 1625)  ondansetron (ZOFRAN) injection 4 mg (4 mg Intravenous Given 04/28/17 1625)     Initial Impression / Assessment and Plan / ED Course  I have reviewed the triage vital signs and the nursing notes.  Pertinent labs & imaging results that were available during my care of the patient were reviewed by me and  considered in my medical decision making (see chart for details).     Patient had MRI of the left knee which showed severe arthritis. She will be given some Percocet along with some crutches and will follow back up with orthopedics  Final Clinical Impressions(s) / ED Diagnoses   Final diagnoses:  Pain  Acute pain of left knee    New Prescriptions New Prescriptions   HYDROCODONE-ACETAMINOPHEN (NORCO/VICODIN) 5-325 MG TABLET    Take 1 tablet by mouth every 6 (six) hours as needed for moderate pain.   OXYCODONE-ACETAMINOPHEN (PERCOCET/ROXICET) 5-325 MG TABLET    Take 1 tablet by mouth every 6 (six) hours as needed.     Milton Ferguson, MD 04/28/17 [phone]

## 2017-04-29 ENCOUNTER — Telehealth: Payer: Self-pay | Admitting: Orthopedic Surgery

## 2017-04-29 NOTE — Telephone Encounter (Signed)
Mrs. Reade called this morning and stated that she got to hurting real bad yesterday so she went to [facility].  She said at that time, she also had an MRI done.  She has been rescheduled for this coming Monday, 05/03/17 at 4:10 to see Dr. Aline Brochure.  She already had an MRI scheduled for Friday 05/07/17 at [facility].  She said she would call them and cancel this appointment.

## 2017-05-03 ENCOUNTER — Encounter: Payer: Self-pay | Admitting: Orthopedic Surgery

## 2017-05-03 ENCOUNTER — Ambulatory Visit (INDEPENDENT_AMBULATORY_CARE_PROVIDER_SITE_OTHER): Payer: BC Managed Care – PPO | Admitting: Orthopedic Surgery

## 2017-05-03 DIAGNOSIS — M1712 Unilateral primary osteoarthritis, left knee: Secondary | ICD-10-CM | POA: Diagnosis not present

## 2017-05-03 DIAGNOSIS — M2342 Loose body in knee, left knee: Secondary | ICD-10-CM

## 2017-05-03 NOTE — Progress Notes (Signed)
Patient ID: Rebecca Odom, female   DOB: [birth year], 46 y.o.   MRN: [id number]  Chief Complaint  Patient presents with  . Follow-up    Left knee pain, MRI follow-up    MRI follow-up with the history as noted below  Patient still having severe pain and giving way of her knee  46 year old female with known osteoarthritis in BMI close to 40 previously evaluated for osteoarthritis was scheduled for six-month follow-up. On Saturday, April 26 she was walking at her job turned a corner left knee buckled she presented to medical facility for pain swelling and trouble weightbearing. She complains of diffuse anterior knee pain medial and lateral seems to be more PERI-patellar area  We treated her with naproxen, ice and activity modification. She did not improve in fact this morning she went to work the left knee gave way on her after several episodes of popping she presents back to the office earlier than scheduled appointment with acute anterior medial knee pain and giving way    Review of Systems  Constitutional: Negative for chills, fever and malaise/fatigue.  Musculoskeletal: Positive for joint pain.  Neurological: Negative for tingling and focal weakness.    Past Medical History:  Diagnosis Date  . Bulging lumbar disc   . Chronic abdominal pain   . Chronic back pain   . Chronic diarrhea   . Chronic knee pain    bilateral  . Chronic nausea   . Diverticulosis   . Headache   . Hypertension   . IBS (irritable bowel syndrome)   . Kidney stones   . Sciatica    Past Surgical History:  Procedure Laterality Date  . CHOLECYSTECTOMY  2004   [facility]-Dr. Arnoldo Morale  . COLONOSCOPY N/A 03/06/2013   Procedure: COLONOSCOPY;  Surgeon: Rogene Houston, MD;  Location: [facility];  Service: Endoscopy;  Laterality: N/A;  1030  . ENDOMETRIAL ABLATION  June 2012   Family History  Problem Relation Age of Onset  . Heart disease Mother        heart attack  . Alzheimer's disease Mother   . Cancer  Father        prostate  . Heart disease Sister        CHF  . Diabetes Brother   . Cancer Brother        prostate  . Heart disease Brother   . Stroke Maternal Grandmother   . Heart disease Maternal Grandfather        massive heart attack  . Colon cancer Neg Hx   . Colon polyps Neg Hx    Social History  Substance Use Topics  . Smoking status: Never Smoker  . Smokeless tobacco: Never Used  . Alcohol use No    Current Outpatient Prescriptions:  .  amLODipine (NORVASC) 10 MG tablet, Take 10 mg by mouth daily., Disp: , Rfl:  .  aspirin 81 MG chewable tablet, Chew by mouth daily., Disp: , Rfl:  .  hydrochlorothiazide (HYDRODIURIL) 25 MG tablet, Take 25 mg by mouth daily., Disp: , Rfl:  .  HYDROcodone-acetaminophen (NORCO/VICODIN) 5-325 MG tablet, Take 1 tablet by mouth every 6 (six) hours as needed for moderate pain., Disp: 20 tablet, Rfl: 0 .  naproxen (NAPROSYN) 500 MG tablet, Take 1 tablet (500 mg total) by mouth 2 (two) times daily with a meal., Disp: 60 tablet, Rfl: 0 .  oxyCODONE-acetaminophen (PERCOCET/ROXICET) 5-325 MG tablet, Take 1 tablet by mouth every 6 (six) hours as needed., Disp: 20 tablet, Rfl: 0 .  propranolol ER (INDERAL LA) 60 MG 24 hr capsule, Take 60 mg by mouth daily., Disp: , Rfl:  .  traMADol (ULTRAM) 50 MG tablet, Take 1 tablet (50 mg total) by mouth every 6 (six) hours as needed., Disp: 30 tablet, Rfl: 0   There were no vitals taken for this visit. Gen. appearance is normal grooming and hygiene normal Orientation to person place and time normal Mood normal Severe gait disturbance requiring assistive device  Ortho Exam Upper extremities no contracture subluxation atrophy tremor or malalignment  Left knee pain swelling stiffness decreased range of motion without instability neurovascular exam and skin intact  Medial joint line tenderness mild lateral joint line tenderness severe crepitance.   A/P  Medical decision-making  MRI shows a atypical loose  body in the knee I think it may be an extension of an osteophyte from the inferior pole the patella  There are multiple osteophytes around the patella.  As I told the patient I don't think arthroscopic surgery alone remedy this problem so I plan to do an arthroscopy and scope the joint to remove the loose body of possible and then perform arthrotomy to do a osteophyte debridement.  She is agreeable to left knee arthroscopy with loose body removal and then arthrotomy and debridement    Encounter Diagnoses  Name Primary?  . Loose body in knee, left knee Yes  . Primary osteoarthritis of left knee      [place], MD 05/03/2017 4:33 PM

## 2017-05-03 NOTE — Patient Instructions (Signed)
You have decided to proceed with operative arthroscopy of the knee and arthrotomy (cut the knee open). You have decided not to continue with nonoperative measures such as but not limited to oral medication, weight loss, activity modification, physical therapy, bracing, or injection.  We will perform operative arthroscopy of the knee and open the knee joint . Some of the risks associated with arthroscopic surgery of the knee include but are not limited to Bleeding Infection Swelling Stiffness Blood clot Pain  If you're not comfortable with these risks and would like to continue with nonoperative treatment please let Dr. Aline Brochure know prior to your surgery.

## 2017-05-04 ENCOUNTER — Other Ambulatory Visit: Payer: Self-pay | Admitting: *Deleted

## 2017-05-04 ENCOUNTER — Encounter: Payer: Self-pay | Admitting: *Deleted

## 2017-05-04 NOTE — Progress Notes (Signed)
Pre auth not required per patient plan for cpt 29874, 27331 outpatient Reference  Nisha L 05/04/17 8:26am

## 2017-05-05 NOTE — Patient Instructions (Signed)
Rebecca Odom  05/05/2017     @PREFPERIOPPHARMACY @   Your procedure is scheduled on 05/12/2017.  Report to [facility] at 11:10 A.M.  Call this number if you have problems the morning of surgery:  [phone]   Remember:  Do not eat food or drink liquids after midnight.  Take these medicines the morning of surgery with A SIP OF WATER:  Amlodipine, Inderal, Hydrocodone, Oxycodone OR Ultram if needed   Do not wear jewelry, make-up or nail polish.  Do not wear lotions, powders, or perfumes, or deoderant.  Do not shave 48 hours prior to surgery.  Men may shave face and neck.  Do not bring valuables to the hospital.  [facility] is not responsible for any belongings or valuables.  Contacts, dentures or bridgework may not be worn into surgery.  Leave your suitcase in the car.  After surgery it may be brought to your room.  For patients admitted to the hospital, discharge time will be determined by your treatment team.  Patients discharged the day of surgery will not be allowed to drive home.    Please read over the following fact sheets that you were given. Surgical Site Infection Prevention and Anesthesia Post-op Instructions     PATIENT INSTRUCTIONS POST-ANESTHESIA  IMMEDIATELY FOLLOWING SURGERY:  Do not drive or operate machinery for the first twenty four hours after surgery.  Do not make any important decisions for twenty four hours after surgery or while taking narcotic pain medications or sedatives.  If you develop intractable nausea and vomiting or a severe headache please notify your doctor immediately.  FOLLOW-UP:  Please make an appointment with your surgeon as instructed. You do not need to follow up with anesthesia unless specifically instructed to do so.  WOUND CARE INSTRUCTIONS (if applicable):  Keep a dry clean dressing on the anesthesia/puncture wound site if there is drainage.  Once the wound has quit draining you may leave it open to air.  Generally you should  leave the bandage intact for twenty four hours unless there is drainage.  If the epidural site drains for more than 36-48 hours please call the anesthesia department.  QUESTIONS?:  Please feel free to call your physician or the hospital operator if you have any questions, and they will be happy to assist you.      Knee Arthroscopy Knee arthroscopy is a surgical procedure that is used to examine the inside of your knee joint and repair any damage. The surgeon puts a small, lighted instrument with a camera on the tip (arthroscope) through a small incision in your knee. The camera sends pictures to a monitor in the operating room. Your surgeon uses those pictures to guide the surgical instruments through other incisions to the area of damage. Knee arthroscopy can be used to treat many types of knee problems. It may be used:  To repair a torn ligament.  To repair or remove damaged tissue.  To remove a fluid-filled sac (cyst) from your knee.  Tell a health care provider about:  Any allergies you have.  All medicines you are taking, including vitamins, herbs, eye drops, creams, and over-the-counter medicines.  Any problems you or family members have had with anesthetic medicines.  Any blood disorders you have.  Any surgeries you have had.  Any medical conditions you have. What are the risks? Generally, this is a safe procedure. However, problems may occur, including:  Infection.  Bleeding.  Damage to blood vessels, nerves, or structures of  your knee.  A blood clot that forms in your leg and travels to your lung.  Failure to relieve symptoms.  What happens before the procedure?  Ask your health care provider about: ? Changing or stopping your regular medicines. This is especially important if you are taking diabetes medicines or blood thinners. ? Taking medicines such as aspirin and ibuprofen. These medicines can thin your blood. Do not take these medicines before your procedure  if your health care provider instructs you not to.  Follow your health care provider's instructions about eating or drinking restrictions.  Plan to have someone take you home after the procedure.  If you go home right after the procedure, plan to have someone with you for 24 hours.  Do not drink alcohol unless your health care provider says that you can.  Do not use any tobacco products, including cigarettes, chewing tobacco, or electronic cigarettes unless your health care provider says that you can. If you need help quitting, ask your health care provider.  You may have a physical exam. What happens during the procedure?  An IV tube will be inserted into one of your veins.  You will be given one or more of the following: ? A medicine that helps you relax (sedative). ? A medicine that numbs the area (local anesthetic). ? A medicine that makes you fall asleep (general anesthetic). ? A medicine that is injected into your spine that numbs the area below and slightly above the injection site (spinal anesthetic). ? A medicine that is injected into an area of your body that numbs everything below the injection site (regional anesthetic).  A cuff may be placed around your upper leg to slow bleeding during the procedure.  The surgeon will make a small number of incisions around your knee.  Your knee joint will be flushed and filled with a germ-free (sterile) solution.  The arthroscope will be passed through an incision into your knee joint.  More instruments will be passed through other incisions to repair your knee as needed.  The fluid will be removed from your knee.  The incisions will be closed with adhesive strips or stitches (sutures).  A bandage (dressing) will be placed over your knee. The procedure may vary among health care providers and hospitals. What happens after the procedure?  Your blood pressure, heart rate, breathing rate and blood oxygen level will be monitored  often until the medicines you were given have worn off.  You may be given medicine for pain.  You may get crutches to help you walk without using your knee to support your body weight.  You may have to wear compression stockings. These stocking help to prevent blood clots and reduce swelling in your legs. This information is not intended to replace advice given to you by your health care provider. Make sure you discuss any questions you have with your health care provider. Document Released: 10/30/2000 Document Revised: 04/09/2016 Document Reviewed: 10/29/2014 Elsevier Interactive Patient Education  2017 [employer].

## 2017-05-07 ENCOUNTER — Encounter (HOSPITAL_COMMUNITY)
Admission: RE | Admit: 2017-05-07 | Discharge: 2017-05-07 | Disposition: A | Discharge status: Home / Self Care | Payer: BC Managed Care – PPO | Source: Ambulatory Visit | Attending: Orthopedic Surgery | Admitting: Orthopedic Surgery

## 2017-05-07 ENCOUNTER — Other Ambulatory Visit: Payer: BC Managed Care – PPO

## 2017-05-10 ENCOUNTER — Ambulatory Visit: Payer: BC Managed Care – PPO | Admitting: Orthopedic Surgery

## 2017-05-11 ENCOUNTER — Encounter (HOSPITAL_COMMUNITY)
Admission: RE | Admit: 2017-05-11 | Discharge: 2017-05-11 | Disposition: A | Discharge status: Home / Self Care | Payer: BC Managed Care – PPO | Source: Ambulatory Visit | Attending: Orthopedic Surgery | Admitting: Orthopedic Surgery

## 2017-05-11 ENCOUNTER — Encounter (HOSPITAL_COMMUNITY): Payer: Self-pay

## 2017-05-11 DIAGNOSIS — M1712 Unilateral primary osteoarthritis, left knee: Secondary | ICD-10-CM | POA: Diagnosis present

## 2017-05-11 DIAGNOSIS — I10 Essential (primary) hypertension: Secondary | ICD-10-CM | POA: Diagnosis not present

## 2017-05-11 DIAGNOSIS — G8929 Other chronic pain: Secondary | ICD-10-CM | POA: Diagnosis not present

## 2017-05-11 DIAGNOSIS — Z79899 Other long term (current) drug therapy: Secondary | ICD-10-CM | POA: Diagnosis not present

## 2017-05-11 DIAGNOSIS — K589 Irritable bowel syndrome without diarrhea: Secondary | ICD-10-CM | POA: Diagnosis not present

## 2017-05-11 DIAGNOSIS — J45909 Unspecified asthma, uncomplicated: Secondary | ICD-10-CM | POA: Diagnosis not present

## 2017-05-11 DIAGNOSIS — R51 Headache: Secondary | ICD-10-CM | POA: Diagnosis not present

## 2017-05-11 HISTORY — DX: Personal history of urinary calculi: Z87.442

## 2017-05-11 HISTORY — DX: Unspecified asthma, uncomplicated: J45.909

## 2017-05-11 LAB — BASIC METABOLIC PANEL
Anion gap: 9 (ref 5–15)
BUN: 11 mg/dL (ref 6–20)
CHLORIDE: 104 mmol/L (ref 101–111)
CO2: 24 mmol/L (ref 22–32)
Calcium: 8.9 mg/dL (ref 8.9–10.3)
Creatinine, Ser: 0.9 mg/dL (ref 0.44–1.00)
GFR calc non Af Amer: >60 mL/min (ref >60)
Glucose, Bld: 80 mg/dL (ref 65–99)
POTASSIUM: 3.6 mmol/L (ref 3.5–5.1)
SODIUM: 137 mmol/L (ref 135–145)

## 2017-05-11 LAB — SURGICAL PCR SCREEN
MRSA, PCR: NEGATIVE
Staphylococcus aureus: NEGATIVE

## 2017-05-11 LAB — CBC
HEMATOCRIT: 36.3 % (ref 36.0–46.0)
Hemoglobin: 11.8 g/dL — ABNORMAL LOW (ref 12.0–15.0)
MCH: 27.9 pg (ref 26.0–34.0)
MCHC: 32.5 g/dL (ref 30.0–36.0)
MCV: 85.8 fL (ref 78.0–100.0)
Platelets: 283 10*3/uL (ref 150–400)
RBC: 4.23 MIL/uL (ref 3.87–5.11)
RDW: 13.1 % (ref 11.5–15.5)
WBC: 14.4 10*3/uL — AB (ref 4.0–10.5)

## 2017-05-11 NOTE — H&P (Signed)
Rebecca Odom is an 46 y.o. female.   Chief Complaint  Patient presents with  . Knee Problem      left knee pain and giving away      46 year old female with known osteoarthritis in BMI close to 40 previously evaluated for osteoarthritis was scheduled for six-month follow-up. On Saturday, April 26 she was walking at her job turned a corner left knee buckled she presented to medical facility for pain swelling and trouble weightbearing. She complains of diffuse anterior knee pain medial and lateral seems to be more PERI-patellar area    I treated her conservatively initially but she did not get better had another episode where she had to go to the ER in the family got an MRI of the knee and it shows extensive arthritis and possible loose body in the joint  Based on these findings and my recommendations giving her the options of surgery or continued nonoperative treatment she opted for operative treatment. I did relate to her that I think the loose body can perhaps be removed arthroscopically but the significant osteophytes and arthritis that she has would be better removed with arthrotomy and she agreed       Review of Systems  Musculoskeletal: Positive for falls and joint pain.  Neurological: Negative for tingling.      history of lumbar disc disease with probable ongoing back issues         Past Medical History:  Diagnosis Date  . Bulging lumbar disc    . Chronic abdominal pain    . Chronic back pain    . Chronic diarrhea    . Chronic nausea    . Diverticulosis    . Headache    . IBS (irritable bowel syndrome)    . Kidney stones    . Sciatica      BP (!) 143/95   Pulse 79   Ht '5\' 4"'$  (1.626 m)   Wt 236 lb (107 kg)   BMI 40.51 kg/m    Physical Exam  Constitutional: She is oriented to person, place, and time. She appears well-developed and well-nourished. No distress.  Cardiovascular: Normal rate and intact distal pulses.   Musculoskeletal:       Left knee: She exhibits  effusion.  Neurological: She is alert and oriented to person, place, and time. She has normal reflexes. She exhibits normal muscle tone. Coordination normal.  Skin: Skin is warm and dry. No rash noted. She is not diaphoretic. No erythema. No pallor.  Psychiatric: She has a normal mood and affect. Her behavior is normal. Judgment and thought content normal.        Right Knee Exam    Tenderness  The patient is experiencing no tenderness.        Range of Motion  Extension: normal  Flexion: normal    Muscle Strength    The patient has normal right knee strength.   Tests  McMurray:  Medial - negative Lateral - negative Drawer:       Anterior - negative    Posterior - negative Varus: negative Valgus: negative   Other  Erythema: absent Scars: absent Sensation: normal Pulse: present Swelling: none     Left Knee Exam    Tenderness  The patient is experiencing tenderness in the medial retinaculum, lateral retinaculum, patella and lateral joint line.   Range of Motion  Extension: normal  Flexion:  90 abnormal    Muscle Strength    The patient has normal left knee  strength.   Tests  McMurray:  Medial - negative Lateral - negative Drawer:       Anterior - negative     Posterior - negative Varus: negative Valgus: negative Patellar Apprehension: positive   Other  Erythema: absent Scars: absent Sensation: normal Pulse: present Swelling: none Effusion: effusion present       Plain films are on a disc there were 2 views are difficult to view but I know from her other x-ray she has osteoarthritis. The report" says no evidence of fracture, questionable lateral displacement of patella. 3 compartment osteophytosis. There is significant patellofemoral and medial femoral tibial compartment narrowing no effusion   IMPRESSION: 1. Age advanced tricompartmental osteoarthritis, most advanced in the patellofemoral compartment. There is a large intra-articular loose body  inferior to the patella. 2. The menisci and ligaments appear intact. ACL mucoid degeneration. 3. No acute osseous findings. 4. Joint effusion and Baker's cyst.     Electronically Signed   By: Richardean Sale M.D.   On: 04/28/2017 17:07     No prescriptions prior to admission.    Results for orders placed or performed during the hospital encounter of 05/11/17 (from the past 48 hour(s))  Basic metabolic panel     Status: None   Collection Time: 05/11/17  3:38 PM  Result Value Ref Range   Sodium 137 135 - 145 mmol/L   Potassium 3.6 3.5 - 5.1 mmol/L   Chloride 104 101 - 111 mmol/L   CO2 24 22 - 32 mmol/L   Glucose, Bld 80 65 - 99 mg/dL   BUN 11 6 - 20 mg/dL   Creatinine, Ser 0.90 0.44 - 1.00 mg/dL   Calcium 8.9 8.9 - 10.3 mg/dL   GFR calc non Af Amer >60 >60 mL/min   GFR calc Af Amer >60 >60 mL/min    Comment: (NOTE) The eGFR has been calculated using the CKD EPI equation. This calculation has not been validated in all clinical situations. eGFR's persistently <60 mL/min signify possible Chronic Kidney Disease.    Anion gap 9 5 - 15  CBC     Status: Abnormal   Collection Time: 05/11/17  3:38 PM  Result Value Ref Range   WBC 14.4 (H) 4.0 - 10.5 K/uL   RBC 4.23 3.87 - 5.11 MIL/uL   Hemoglobin 11.8 (L) 12.0 - 15.0 g/dL   HCT 36.3 36.0 - 46.0 %   MCV 85.8 78.0 - 100.0 fL   MCH 27.9 26.0 - 34.0 pg   MCHC 32.5 30.0 - 36.0 g/dL   RDW 13.1 11.5 - 15.5 %   Platelets 283 150 - 400 K/uL   No results found.  ROS  There were no vitals taken for this visit. Physical Exam   Assessment/Plan Arthroscopy left knee with loose body removal for loose body left knee with osteoarthritis  Possible arthrotomy left knee with debridement  Arther Abbott, MD 05/11/2017, 5:04 PM

## 2017-05-12 ENCOUNTER — Ambulatory Visit (HOSPITAL_COMMUNITY): Payer: BC Managed Care – PPO | Admitting: Anesthesiology

## 2017-05-12 ENCOUNTER — Encounter (HOSPITAL_COMMUNITY)
Admission: RE | Disposition: A | Discharge status: Home / Self Care | Payer: Self-pay | Source: Ambulatory Visit | Attending: Orthopedic Surgery

## 2017-05-12 ENCOUNTER — Ambulatory Visit (HOSPITAL_COMMUNITY)
Admission: RE | Admit: 2017-05-12 | Discharge: 2017-05-12 | Disposition: A | Discharge status: Home / Self Care | Payer: BC Managed Care – PPO | Source: Ambulatory Visit | Attending: Orthopedic Surgery | Admitting: Orthopedic Surgery

## 2017-05-12 ENCOUNTER — Encounter (HOSPITAL_COMMUNITY): Payer: Self-pay

## 2017-05-12 DIAGNOSIS — M1712 Unilateral primary osteoarthritis, left knee: Secondary | ICD-10-CM | POA: Insufficient documentation

## 2017-05-12 DIAGNOSIS — Z9889 Other specified postprocedural states: Secondary | ICD-10-CM

## 2017-05-12 DIAGNOSIS — I10 Essential (primary) hypertension: Secondary | ICD-10-CM | POA: Insufficient documentation

## 2017-05-12 DIAGNOSIS — J45909 Unspecified asthma, uncomplicated: Secondary | ICD-10-CM | POA: Insufficient documentation

## 2017-05-12 DIAGNOSIS — Z79899 Other long term (current) drug therapy: Secondary | ICD-10-CM | POA: Insufficient documentation

## 2017-05-12 DIAGNOSIS — M2342 Loose body in knee, left knee: Secondary | ICD-10-CM

## 2017-05-12 DIAGNOSIS — G8929 Other chronic pain: Secondary | ICD-10-CM | POA: Insufficient documentation

## 2017-05-12 DIAGNOSIS — K589 Irritable bowel syndrome without diarrhea: Secondary | ICD-10-CM | POA: Insufficient documentation

## 2017-05-12 DIAGNOSIS — R51 Headache: Secondary | ICD-10-CM | POA: Insufficient documentation

## 2017-05-12 HISTORY — PX: KNEE ARTHROSCOPY: SHX127

## 2017-05-12 HISTORY — PX: KNEE ARTHROTOMY: SHX5881

## 2017-05-12 LAB — PREGNANCY, URINE: Preg Test, Ur: NEGATIVE

## 2017-05-12 SURGERY — ARTHROTOMY, KNEE
Anesthesia: General | Site: Knee | Laterality: Left

## 2017-05-12 MED ORDER — ROCURONIUM BROMIDE 50 MG/5ML IV SOLN
INTRAVENOUS | Status: AC
  Filled 2017-05-12: qty 1

## 2017-05-12 MED ORDER — SODIUM CHLORIDE 0.9 % IR SOLN
Status: DC | PRN
  Administered 2017-05-12: 1000 mL

## 2017-05-12 MED ORDER — ONDANSETRON HCL 4 MG/2ML IJ SOLN
INTRAMUSCULAR | Status: DC | PRN
  Administered 2017-05-12: 4 mg via INTRAVENOUS

## 2017-05-12 MED ORDER — FENTANYL CITRATE (PF) 100 MCG/2ML IJ SOLN
25.0000 ug | INTRAMUSCULAR | Status: DC | PRN
Start: 2017-05-12 — End: 2017-05-12
  Administered 2017-05-12 (×3): 50 ug via INTRAVENOUS
  Filled 2017-05-12 (×2): qty 2

## 2017-05-12 MED ORDER — ACETAMINOPHEN 500 MG PO TABS: 1000.0000 mg | ORAL_TABLET | Freq: Four times a day (QID) | ORAL | 2 refills | Status: AC | PRN

## 2017-05-12 MED ORDER — CEFAZOLIN SODIUM-DEXTROSE 1-4 GM/50ML-% IV SOLN
INTRAVENOUS | Status: AC
  Filled 2017-05-12: qty 100

## 2017-05-12 MED ORDER — POVIDONE-IODINE 10 % EX SWAB: 2.0000 "application " | Freq: Once | CUTANEOUS | Status: DC

## 2017-05-12 MED ORDER — PROPOFOL 10 MG/ML IV BOLUS
INTRAVENOUS | Status: DC | PRN
  Administered 2017-05-12: 150 mg via INTRAVENOUS

## 2017-05-12 MED ORDER — ONDANSETRON HCL 4 MG/2ML IJ SOLN
INTRAMUSCULAR | Status: AC
  Filled 2017-05-12: qty 2

## 2017-05-12 MED ORDER — MIDAZOLAM HCL 2 MG/2ML IJ SOLN
1.0000 mg | Freq: Once | INTRAMUSCULAR | Status: AC | PRN
  Administered 2017-05-12: 2 mg via INTRAVENOUS

## 2017-05-12 MED ORDER — FENTANYL CITRATE (PF) 100 MCG/2ML IJ SOLN
INTRAMUSCULAR | Status: AC
  Filled 2017-05-12: qty 2

## 2017-05-12 MED ORDER — ONDANSETRON HCL 4 MG/2ML IJ SOLN
4.0000 mg | Freq: Once | INTRAMUSCULAR | Status: AC
  Administered 2017-05-12: 4 mg via INTRAVENOUS
  Filled 2017-05-12: qty 2

## 2017-05-12 MED ORDER — PROPOFOL 10 MG/ML IV BOLUS
INTRAVENOUS | Status: AC
  Filled 2017-05-12: qty 20

## 2017-05-12 MED ORDER — LACTATED RINGERS IV SOLN
INTRAVENOUS | Status: DC
  Administered 2017-05-12: 13:00:00 via INTRAVENOUS

## 2017-05-12 MED ORDER — FENTANYL CITRATE (PF) 250 MCG/5ML IJ SOLN
INTRAMUSCULAR | Status: AC
  Filled 2017-05-12: qty 5

## 2017-05-12 MED ORDER — LIDOCAINE HCL (PF) 1 % IJ SOLN
INTRAMUSCULAR | Status: AC
  Filled 2017-05-12: qty 5

## 2017-05-12 MED ORDER — LIDOCAINE 2% (20 MG/ML) 5 ML SYRINGE
INTRAMUSCULAR | Status: DC | PRN
  Administered 2017-05-12: 40 mg via INTRAVENOUS

## 2017-05-12 MED ORDER — GLYCOPYRROLATE 0.2 MG/ML IJ SOLN
INTRAMUSCULAR | Status: AC
  Filled 2017-05-12: qty 1

## 2017-05-12 MED ORDER — KETOROLAC TROMETHAMINE 30 MG/ML IJ SOLN
30.0000 mg | Freq: Once | INTRAMUSCULAR | Status: AC
  Administered 2017-05-12: 30 mg via INTRAVENOUS
  Filled 2017-05-12: qty 1

## 2017-05-12 MED ORDER — DEXAMETHASONE SODIUM PHOSPHATE 4 MG/ML IJ SOLN
INTRAMUSCULAR | Status: AC
  Filled 2017-05-12: qty 1

## 2017-05-12 MED ORDER — BUPIVACAINE-EPINEPHRINE (PF) 0.5% -1:200000 IJ SOLN
INTRAMUSCULAR | Status: AC
  Filled 2017-05-12: qty 60

## 2017-05-12 MED ORDER — CEFAZOLIN SODIUM-DEXTROSE 2-4 GM/100ML-% IV SOLN
2.0000 g | INTRAVENOUS | Status: AC
  Administered 2017-05-12: 2 g via INTRAVENOUS

## 2017-05-12 MED ORDER — IBUPROFEN 800 MG PO TABS: 800.0000 mg | ORAL_TABLET | Freq: Three times a day (TID) | ORAL | 1 refills | Status: DC | PRN

## 2017-05-12 MED ORDER — FENTANYL CITRATE (PF) 100 MCG/2ML IJ SOLN
INTRAMUSCULAR | Status: DC | PRN
  Administered 2017-05-12 (×5): 50 ug via INTRAVENOUS

## 2017-05-12 MED ORDER — SODIUM CHLORIDE 0.9 % IR SOLN
Status: DC | PRN
  Administered 2017-05-12 (×4): 3000 mL

## 2017-05-12 MED ORDER — MIDAZOLAM HCL 2 MG/2ML IJ SOLN
INTRAMUSCULAR | Status: AC
Start: 2017-05-12 — End: 2017-05-12
  Filled 2017-05-12: qty 2

## 2017-05-12 MED ORDER — BUPIVACAINE-EPINEPHRINE (PF) 0.5% -1:200000 IJ SOLN
INTRAMUSCULAR | Status: DC | PRN
  Administered 2017-05-12: 60 mL via PERINEURAL

## 2017-05-12 MED ORDER — OXYCODONE HCL 5 MG PO TABS
ORAL_TABLET | ORAL | Status: AC
  Filled 2017-05-12: qty 1

## 2017-05-12 MED ORDER — HYDROMORPHONE HCL 2 MG PO TABS: 2.0000 mg | ORAL_TABLET | Freq: Four times a day (QID) | ORAL | 0 refills | Status: AC | PRN

## 2017-05-12 MED ORDER — CHLORHEXIDINE GLUCONATE 4 % EX LIQD: 60.0000 mL | Freq: Once | CUTANEOUS | Status: DC

## 2017-05-12 MED ORDER — EPINEPHRINE PF 1 MG/ML IJ SOLN
INTRAMUSCULAR | Status: AC
  Filled 2017-05-12: qty 5

## 2017-05-12 MED ORDER — EPINEPHRINE PF 1 MG/ML IJ SOLN
INTRAMUSCULAR | Status: AC
  Filled 2017-05-12: qty 4

## 2017-05-12 MED ORDER — OXYCODONE HCL 5 MG PO TABS: 5.0000 mg | ORAL_TABLET | Freq: Once | ORAL | Status: DC

## 2017-05-12 SURGICAL SUPPLY — 51 items
ARTHROWAND PARAGON T2 (SURGICAL WAND)
BAG HAMPER (MISCELLANEOUS) ×3 IMPLANT
BANDAGE ELASTIC 6 VELCRO NS (GAUZE/BANDAGES/DRESSINGS) ×3 IMPLANT
BANDAGE ELASTIC 6 VELCRO ST LF (GAUZE/BANDAGES/DRESSINGS) ×3 IMPLANT
BLADE AGGRESSIVE PLUS 4.0 (BLADE) ×3 IMPLANT
BLADE SURG SZ11 CARB STEEL (BLADE) ×3 IMPLANT
CHLORAPREP W/TINT 26ML (MISCELLANEOUS) ×3 IMPLANT
CLOTH BEACON ORANGE TIMEOUT ST (SAFETY) ×3 IMPLANT
COOLER CRYO IC GRAV AND TUBE (ORTHOPEDIC SUPPLIES) ×3 IMPLANT
COVER PROBE W GEL 5X96 (DRAPES) ×3 IMPLANT
CUFF CRYO KNEE LG 20X31 COOLER (ORTHOPEDIC SUPPLIES) ×3 IMPLANT
CUFF TOURNIQUET SINGLE 34IN LL (TOURNIQUET CUFF) IMPLANT
CUTTER ANGLED DBL BITE 4.5 (BURR) IMPLANT
DECANTER SPIKE VIAL GLASS SM (MISCELLANEOUS) ×6 IMPLANT
GAUZE SPONGE 4X4 12PLY STRL (GAUZE/BANDAGES/DRESSINGS) ×3 IMPLANT
GAUZE SPONGE 4X4 16PLY XRAY LF (GAUZE/BANDAGES/DRESSINGS) ×3 IMPLANT
GAUZE XEROFORM 5X9 LF (GAUZE/BANDAGES/DRESSINGS) ×3 IMPLANT
GLOVE BIOGEL PI IND STRL 7.0 (GLOVE) ×1 IMPLANT
GLOVE BIOGEL PI INDICATOR 7.0 (GLOVE) ×2
GLOVE SKINSENSE NS SZ8.0 LF (GLOVE) ×2
GLOVE SKINSENSE STRL SZ8.0 LF (GLOVE) ×1 IMPLANT
GLOVE SS N UNI LF 8.5 STRL (GLOVE) ×3 IMPLANT
GOWN STRL REUS W/TWL LRG LVL3 (GOWN DISPOSABLE) ×3 IMPLANT
GOWN STRL REUS W/TWL XL LVL3 (GOWN DISPOSABLE) ×3 IMPLANT
HLDR LEG FOAM (MISCELLANEOUS) ×1 IMPLANT
IV NS IRRIG 3000ML ARTHROMATIC (IV SOLUTION) ×12 IMPLANT
KIT BLADEGUARD II DBL (SET/KITS/TRAYS/PACK) ×3 IMPLANT
KIT ROOM TURNOVER AP CYSTO (KITS) ×3 IMPLANT
LEG HOLDER FOAM (MISCELLANEOUS) ×2
MANIFOLD NEPTUNE II (INSTRUMENTS) ×3 IMPLANT
MARKER SKIN DUAL TIP RULER LAB (MISCELLANEOUS) ×3 IMPLANT
NEEDLE HYPO 18GX1.5 BLUNT FILL (NEEDLE) ×3 IMPLANT
NEEDLE HYPO 21X1.5 SAFETY (NEEDLE) ×3 IMPLANT
NEEDLE SPNL 18GX3.5 QUINCKE PK (NEEDLE) ×3 IMPLANT
NS IRRIG 1000ML POUR BTL (IV SOLUTION) ×3 IMPLANT
PACK ARTHRO LIMB DRAPE STRL (MISCELLANEOUS) ×3 IMPLANT
PAD ABD 5X9 TENDERSORB (GAUZE/BANDAGES/DRESSINGS) ×3 IMPLANT
PAD ABD 8X10 STRL (GAUZE/BANDAGES/DRESSINGS) ×3 IMPLANT
PAD ARMBOARD 7.5X6 YLW CONV (MISCELLANEOUS) ×3 IMPLANT
PADDING CAST COTTON 6X4 STRL (CAST SUPPLIES) ×3 IMPLANT
PADDING WEBRIL 6 STERILE (GAUZE/BANDAGES/DRESSINGS) ×3 IMPLANT
PROBE BIPOLAR 50 DEGREE SUCT (MISCELLANEOUS) ×3 IMPLANT
PROBE BIPOLAR ATHRO 135MM 90D (MISCELLANEOUS) ×3 IMPLANT
SET ARTHROSCOPY INST (INSTRUMENTS) ×3 IMPLANT
SET ARTHROSCOPY PUMP TUBE (IRRIGATION / IRRIGATOR) ×3 IMPLANT
SET BASIN LINEN APH (SET/KITS/TRAYS/PACK) ×3 IMPLANT
SUT ETHILON 3 0 FSL (SUTURE) IMPLANT
SYR 30ML LL (SYRINGE) ×3 IMPLANT
TUBE CONNECTING 12'X1/4 (SUCTIONS) ×4
TUBE CONNECTING 12X1/4 (SUCTIONS) ×8 IMPLANT
WAND ARTHRO PARAGON T2 (SURGICAL WAND) IMPLANT

## 2017-05-12 NOTE — Transfer of Care (Signed)
Immediate Anesthesia Transfer of Care Note  Patient: Rebecca Odom  Procedure(s) Performed: Procedure(s): KNEE ARTHROTOMY (Left) ARTHROSCOPY KNEE removal loose body (Left)  Patient Location: PACU  Anesthesia Type:General  Level of Consciousness: awake, oriented and patient cooperative  Airway & Oxygen Therapy: Patient Spontanous Breathing and Patient connected to nasal cannula oxygen  Post-op Assessment: Report given to RN, Post -op Vital signs reviewed and stable and Patient moving all extremities X 4  Post vital signs: Reviewed and stable  Last Vitals:  Vitals:   05/12/17 1121 05/12/17 1404  BP: (!) 147/79 (!) 156/91  Pulse: 81 (!) 107  Resp: 20 14  Temp: 36.7 C 36.6 C    Last Pain:  Vitals:   05/12/17 1404  TempSrc:   PainSc: 8       Patients Stated Pain Goal: 7 (73/53/29 9242)  Complications: No apparent anesthesia complications

## 2017-05-12 NOTE — Interval H&P Note (Signed)
History and Physical Interval Note:  05/12/2017 12:35 PM  Rebecca Odom  has presented today for surgery, with the diagnosis of loose body osteoarthritis left knee  The various methods of treatment have been discussed with the patient and family. After consideration of risks, benefits and other options for treatment, the patient has consented to  Procedure(s): KNEE ARTHROTOMY (Left) ARTHROSCOPY KNEE removal loose body (Left) as a surgical intervention .  The patient's history has been reviewed, patient examined, no change in status, stable for surgery.  I have reviewed the patient's chart and labs.  Questions were answered to the patient's satisfaction.     Arther Abbott

## 2017-05-12 NOTE — Anesthesia Procedure Notes (Signed)
Procedure Name: LMA Insertion Date/Time: 05/12/2017 1:00 PM Performed by: Lajuana Carry E Pre-anesthesia Checklist: Patient identified, Emergency Drugs available, Suction available, Patient being monitored and Timeout performed Patient Re-evaluated:Patient Re-evaluated prior to inductionOxygen Delivery Method: Circle system utilized Preoxygenation: Pre-oxygenation with 100% oxygen Intubation Type: IV induction Ventilation: Mask ventilation without difficulty LMA: LMA inserted LMA Size: 4.0 Number of attempts: 1 Placement Confirmation: positive ETCO2 and breath sounds checked- equal and bilateral Dental Injury: Teeth and Oropharynx as per pre-operative assessment

## 2017-05-12 NOTE — Interval H&P Note (Signed)
History and Physical Interval Note:  05/12/2017 12:32 PM  Rebecca Odom  has presented today for surgery, with the diagnosis of loose body osteoarthritis left knee  The various methods of treatment have been discussed with the patient and family. After consideration of risks, benefits and other options for treatment, the patient has consented to  Procedure(s): KNEE ARTHROTOMY (Left) ARTHROSCOPY KNEE removal loose body (Left) as a surgical intervention .  The patient's history has been reviewed, patient examined, no change in status, stable for surgery.  I have reviewed the patient's chart and labs.  Questions were answered to the patient's satisfaction.     Arther Abbott

## 2017-05-12 NOTE — Anesthesia Preprocedure Evaluation (Signed)
Anesthesia Evaluation  Patient identified by MRN, date of birth, ID band  Airway Mallampati: I       Dental  (+) Teeth Intact   Pulmonary asthma ,    Pulmonary exam normal breath sounds clear to auscultation       Cardiovascular hypertension, Pt. on medications  Rhythm:Regular Rate:Normal     Neuro/Psych  Headaches,  Neuromuscular disease    GI/Hepatic   Endo/Other    Renal/GU      Musculoskeletal   Abdominal   Peds  Hematology   Anesthesia Other Findings   Reproductive/Obstetrics                             Anesthesia Physical Anesthesia Plan  ASA: II  Anesthesia Plan: General   Post-op Pain Management:    Induction: Intravenous  PONV Risk Score and Plan:   Airway Management Planned: LMA  Additional Equipment:   Intra-op Plan:   Post-operative Plan: Extubation in OR  Informed Consent: I have reviewed the patients History and Physical, chart, labs and discussed the procedure including the risks, benefits and alternatives for the proposed anesthesia with the patient or authorized representative who has indicated his/her understanding and acceptance.     Plan Discussed with: CRNA  Anesthesia Plan Comments:         Anesthesia Quick Evaluation

## 2017-05-12 NOTE — Brief Op Note (Signed)
05/12/2017  1:55 PM  PATIENT:  Rebecca Odom  46 y.o. female  PRE-OPERATIVE DIAGNOSIS:  loose body osteoarthritis left knee  POST-OPERATIVE DIAGNOSIS:  loose body osteoarthritis left knee  PROCEDURE:  Procedure(s): KNEE ARTHROTOMY (Left) ARTHROSCOPY KNEE removal loose body (Left) 77412  SURGEON:  Surgeon(s) and Role:    Carole Civil, MD - Primary  PHYSICIAN ASSISTANT:   ASSISTANTS: none   ANESTHESIA:   general  EBL:  No intake/output data recorded.  BLOOD ADMINISTERED:none  DRAINS: none   LOCAL MEDICATIONS USED:  MARCAINE     SPECIMEN:  No Specimen  DISPOSITION OF SPECIMEN:  N/A  COUNTS:  YES  TOURNIQUET:    DICTATION: .Dragon Dictation  PLAN OF CARE: Discharge to home after PACU  PATIENT DISPOSITION:  PACU - hemodynamically stable.   Delay start of Pharmacological VTE agent (>24hrs) due to surgical blood loss or risk of bleeding: not applicable  [id number]

## 2017-05-12 NOTE — Op Note (Signed)
Surgical dictation  1:57 PM 05/12/2017  Preop diagnosis osteoarthritis loose body left knee  Postop diagnosis same  Procedure arthroscopy left knee removal of loose body  Surgeon Dr. Aline Brochure no assistants  Gen. Anesthesia  Surgical findings grade 4 arthritis in the patellofemoral joint primarily lateral trochlea and facet with grade 3 changes in the medial trochlea  Multiple areas of osteophytes on the femur and tibia  Normal medial and lateral meniscus and anterior cruciate ligament  Grade 2 chondral changes medial femoral condyle  Large loose body found in the lateral gutter  Details of procedure:  The patient was identified in the preop area and site of surgery confirmed and marked  She was taken to the operating room given Ancef  After general anesthesia and adequate surgical surgical prep and drape timeout was completed  I made a standard lateral portal and placed the scope in the joint and did a diagnostic arthroscopy and did not find a loose body  I did find several areas of osteoarthritis as described  We used the wash mode of the arthroscopic pump and still did not see any loose body.  However, the loose body was found on a second tour of the knee in the lateral gutter. A mattress lateral portal with an 11 blade. I used a large trocar blunt to make a large portal and placed a grasper to grab the loose body and removed  I then went through the knee for a fourth time and there were no other loose bodies  I repaired the portal sites with 3-0 nylon suture as well as the accessory lateral portal with 3-0 nylon suture  I then injected 60 mL of Marcaine with epinephrine into the joint  Sterile dressing was applied  Patient extubated taken to recovery room in stable condition  No complications were noted  Sponge and needle count were correct  No specimens were needed or sent

## 2017-05-12 NOTE — Anesthesia Postprocedure Evaluation (Signed)
Anesthesia Post Note  Patient: Rebecca Odom  Procedure(s) Performed: Procedure(s) (LRB): KNEE ARTHROTOMY (Left) ARTHROSCOPY KNEE removal loose body (Left)  Patient location during evaluation: PACU Anesthesia Type: General Level of consciousness: awake and alert, oriented and patient cooperative Pain management: pain level controlled Vital Signs Assessment: post-procedure vital signs reviewed and stable Respiratory status: spontaneous breathing and nonlabored ventilation Cardiovascular status: blood pressure returned to baseline Postop Assessment: no headache and no signs of nausea or vomiting Anesthetic complications: no     Last Vitals:  Vitals:   05/12/17 1415 05/12/17 1430  BP: (!) 164/83 (!) 142/74  Pulse: (!) 103 94  Resp: 14 (P) 14  Temp:      Last Pain:  Vitals:   05/12/17 1404  TempSrc:   PainSc: 8                  Kennede Lusk E Flossie Buffy

## 2017-05-13 ENCOUNTER — Encounter (HOSPITAL_COMMUNITY): Payer: Self-pay | Admitting: Orthopedic Surgery

## 2017-05-17 ENCOUNTER — Ambulatory Visit (INDEPENDENT_AMBULATORY_CARE_PROVIDER_SITE_OTHER): Payer: BC Managed Care – PPO | Admitting: Orthopedic Surgery

## 2017-05-17 ENCOUNTER — Encounter: Payer: Self-pay | Admitting: Orthopedic Surgery

## 2017-05-17 DIAGNOSIS — Z9889 Other specified postprocedural states: Secondary | ICD-10-CM

## 2017-05-17 DIAGNOSIS — Z4889 Encounter for other specified surgical aftercare: Secondary | ICD-10-CM

## 2017-05-17 NOTE — Progress Notes (Signed)
Postop visit #1 left knee arthroscopy and removal of loose body  Major finding was the loose body and osteoarthritis of the knee  Chief Complaint  Patient presents with  . Follow-up    recheck on left knee, SALK, DOS 05-12-17.    All sutures are removed including the lateral accessory portal to get the loose body out of the knee  The patient's knee is swollen the calf is supple and nontender there is no peripheral edema  She is ambulatory with a walker full weightbearing  Encounter Diagnoses  Name Primary?  Marland Kitchen Aftercare following surgery Yes  . S/P arthroscopy of left knee     Return in 2 weeks roughly 4 reevaluation. She can start home exercises using the PEP pad

## 2017-05-17 NOTE — Patient Instructions (Signed)
CONTINUE ICE  WEIGHT BEARING INCREASE DAILY AS TOLERATED DO EXERCISES AS GIVEN  RETURN WED July 18

## 2017-06-01 ENCOUNTER — Encounter: Payer: Self-pay | Admitting: Orthopedic Surgery

## 2017-06-01 ENCOUNTER — Ambulatory Visit (INDEPENDENT_AMBULATORY_CARE_PROVIDER_SITE_OTHER): Payer: BC Managed Care – PPO | Admitting: Orthopedic Surgery

## 2017-06-01 DIAGNOSIS — Z4889 Encounter for other specified surgical aftercare: Secondary | ICD-10-CM

## 2017-06-01 DIAGNOSIS — Z9889 Other specified postprocedural states: Secondary | ICD-10-CM

## 2017-06-01 NOTE — Progress Notes (Signed)
POST OP APPT  recheck on left knee, SALK, DOS 05-12-17.   SHE IS DOING WELL, SHE USES ONE CRUTCH WHEN OUT OF THE HOUSE   LEFT KNEE portal sites are clean dry and intact. Range of motion is now up to 120.  Small effusion No peripheral edema Is supple  Continue exercises at home  Return in 3 weeks for postop recheck  Encounter Diagnoses  Name Primary?  Marland Kitchen Aftercare following surgery Yes  . S/P arthroscopy of left knee

## 2017-06-22 ENCOUNTER — Encounter: Payer: Self-pay | Admitting: Orthopedic Surgery

## 2017-06-22 ENCOUNTER — Ambulatory Visit (INDEPENDENT_AMBULATORY_CARE_PROVIDER_SITE_OTHER): Payer: BC Managed Care – PPO | Admitting: Orthopedic Surgery

## 2017-06-22 DIAGNOSIS — Z9889 Other specified postprocedural states: Secondary | ICD-10-CM

## 2017-06-22 DIAGNOSIS — M545 Low back pain, unspecified: Secondary | ICD-10-CM

## 2017-06-22 DIAGNOSIS — Z4889 Encounter for other specified surgical aftercare: Secondary | ICD-10-CM

## 2017-06-22 MED ORDER — TIZANIDINE HCL 4 MG PO TABS: 4.0000 mg | ORAL_TABLET | Freq: Four times a day (QID) | ORAL | 0 refills | Status: DC | PRN

## 2017-06-22 NOTE — Patient Instructions (Signed)
Start the muscle relaxer Zanaflex/time tizanidine and take ibuprofen and use a heating pad for the back pain

## 2017-06-22 NOTE — Progress Notes (Signed)
This is a postop visit  Date of surgery 05/12/2017  We removed the loose body the knee was full of arthritis  She did recover however. She is complaining of some hypersensitivity over the medial portal  Knee otherwise looks good  She is complaining of lower back pain for 2-3 weeks no radicular symptoms. No red flag symptoms  Recommend tizanidine and ibuprofen for that  Follow-up in a year for x-rays as this knee will eventually fail.  Encounter Diagnoses  Name Primary?  . Acute midline low back pain without sciatica Yes  . S/P arthroscopy of left knee   . Aftercare following surgery

## 2018-06-22 ENCOUNTER — Ambulatory Visit: Payer: Self-pay | Admitting: Orthopedic Surgery

## 2018-06-29 ENCOUNTER — Ambulatory Visit: Payer: BC Managed Care – PPO | Admitting: Orthopedic Surgery

## 2018-07-03 ENCOUNTER — Emergency Department (HOSPITAL_COMMUNITY)
Admission: EM | Admit: 2018-07-03 | Discharge: 2018-07-03 | Disposition: A | Discharge status: Home / Self Care | Payer: BC Managed Care – PPO | Attending: Emergency Medicine | Admitting: Emergency Medicine

## 2018-07-03 ENCOUNTER — Emergency Department (HOSPITAL_COMMUNITY): Payer: BC Managed Care – PPO

## 2018-07-03 ENCOUNTER — Other Ambulatory Visit: Payer: Self-pay

## 2018-07-03 DIAGNOSIS — H10023 Other mucopurulent conjunctivitis, bilateral: Secondary | ICD-10-CM | POA: Diagnosis not present

## 2018-07-03 DIAGNOSIS — Z79899 Other long term (current) drug therapy: Secondary | ICD-10-CM | POA: Diagnosis not present

## 2018-07-03 DIAGNOSIS — I10 Essential (primary) hypertension: Secondary | ICD-10-CM | POA: Diagnosis not present

## 2018-07-03 DIAGNOSIS — J020 Streptococcal pharyngitis: Secondary | ICD-10-CM | POA: Insufficient documentation

## 2018-07-03 DIAGNOSIS — J029 Acute pharyngitis, unspecified: Secondary | ICD-10-CM | POA: Diagnosis present

## 2018-07-03 DIAGNOSIS — H1033 Unspecified acute conjunctivitis, bilateral: Secondary | ICD-10-CM

## 2018-07-03 LAB — COMPREHENSIVE METABOLIC PANEL
ALK PHOS: 80 U/L (ref 38–126)
ALT: 15 U/L (ref 0–44)
AST: 14 U/L — ABNORMAL LOW (ref 15–41)
Albumin: 3.6 g/dL (ref 3.5–5.0)
Anion gap: 10 (ref 5–15)
BUN: 8 mg/dL (ref 6–20)
CALCIUM: 9 mg/dL (ref 8.9–10.3)
CO2: 28 mmol/L (ref 22–32)
CREATININE: 0.75 mg/dL (ref 0.44–1.00)
Chloride: 100 mmol/L (ref 98–111)
GFR calc non Af Amer: >60 mL/min (ref >60)
GLUCOSE: 95 mg/dL (ref 70–99)
Potassium: 3.5 mmol/L (ref 3.5–5.1)
SODIUM: 138 mmol/L (ref 135–145)
Total Bilirubin: 0.5 mg/dL (ref 0.3–1.2)
Total Protein: 8.1 g/dL (ref 6.5–8.1)

## 2018-07-03 LAB — CBC WITH DIFFERENTIAL/PLATELET
BASOS PCT: 0 %
Basophils Absolute: 0 10*3/uL (ref 0.0–0.1)
EOS ABS: 0.1 10*3/uL (ref 0.0–0.7)
Eosinophils Relative: 1 %
HCT: 40 % (ref 36.0–46.0)
Hemoglobin: 12.9 g/dL (ref 12.0–15.0)
Lymphocytes Relative: 28 %
Lymphs Abs: 3.3 10*3/uL (ref 0.7–4.0)
MCH: 27.6 pg (ref 26.0–34.0)
MCHC: 32.3 g/dL (ref 30.0–36.0)
MCV: 85.7 fL (ref 78.0–100.0)
MONO ABS: 1.1 10*3/uL — AB (ref 0.1–1.0)
Monocytes Relative: 9 %
NEUTROS ABS: 7.2 10*3/uL (ref 1.7–7.7)
NEUTROS PCT: 62 %
PLATELETS: 340 10*3/uL (ref 150–400)
RBC: 4.67 MIL/uL (ref 3.87–5.11)
RDW: 12.8 % (ref 11.5–15.5)
WBC: 11.7 10*3/uL — ABNORMAL HIGH (ref 4.0–10.5)

## 2018-07-03 MED ORDER — IOHEXOL 300 MG/ML  SOLN
75.0000 mL | Freq: Once | INTRAMUSCULAR | Status: AC | PRN
  Administered 2018-07-03: 75 mL via INTRAVENOUS

## 2018-07-03 MED ORDER — CIPROFLOXACIN HCL 0.3 % OP OINT: TOPICAL_OINTMENT | OPHTHALMIC | 0 refills | Status: DC

## 2018-07-03 MED ORDER — FLUORESCEIN SODIUM 1 MG OP STRP
2.0000 | ORAL_STRIP | Freq: Once | OPHTHALMIC | Status: AC
  Administered 2018-07-03: 2 via OPHTHALMIC
  Filled 2018-07-03: qty 2

## 2018-07-03 MED ORDER — ONDANSETRON HCL 4 MG/2ML IJ SOLN
4.0000 mg | Freq: Once | INTRAMUSCULAR | Status: AC
  Administered 2018-07-03: 4 mg via INTRAVENOUS
  Filled 2018-07-03: qty 2

## 2018-07-03 MED ORDER — FENTANYL CITRATE (PF) 100 MCG/2ML IJ SOLN
50.0000 ug | Freq: Once | INTRAMUSCULAR | Status: AC
  Administered 2018-07-03: 50 ug via INTRAVENOUS
  Filled 2018-07-03: qty 2

## 2018-07-03 MED ORDER — PREDNISONE 10 MG (21) PO TBPK: ORAL_TABLET | ORAL | 0 refills | Status: DC

## 2018-07-03 MED ORDER — FLUCONAZOLE 150 MG PO TABS: 150.0000 mg | ORAL_TABLET | Freq: Once | ORAL | 0 refills | Status: AC

## 2018-07-03 MED ORDER — TETRACAINE HCL 0.5 % OP SOLN
2.0000 [drp] | Freq: Once | OPHTHALMIC | Status: AC
  Administered 2018-07-03: 2 [drp] via OPHTHALMIC
  Filled 2018-07-03: qty 4

## 2018-07-03 MED ORDER — IBUPROFEN 800 MG PO TABS
800.0000 mg | ORAL_TABLET | Freq: Three times a day (TID) | ORAL | 0 refills | Status: AC | PRN
Start: 2018-07-03 — End: ?

## 2018-07-03 MED ORDER — CLINDAMYCIN HCL 300 MG PO CAPS: 300.0000 mg | ORAL_CAPSULE | Freq: Three times a day (TID) | ORAL | 0 refills | Status: DC

## 2018-07-03 MED ORDER — CLINDAMYCIN PHOSPHATE 600 MG/50ML IV SOLN
600.0000 mg | Freq: Once | INTRAVENOUS | Status: AC
  Administered 2018-07-03: 600 mg via INTRAVENOUS
  Filled 2018-07-03: qty 50

## 2018-07-03 MED ORDER — DEXAMETHASONE SODIUM PHOSPHATE 10 MG/ML IJ SOLN
10.0000 mg | Freq: Once | INTRAMUSCULAR | Status: AC
  Administered 2018-07-03: 10 mg via INTRAVENOUS
  Filled 2018-07-03: qty 1

## 2018-07-03 NOTE — ED Provider Notes (Signed)
[facility] EMERGENCY DEPARTMENT Provider Note   CSN: [id number] Arrival date & time: 07/03/18  1117     History   Chief Complaint Chief Complaint  Patient presents with  . Eye Drainage  . Sore Throat    HPI Rebecca Odom is a 47 y.o. female.  Pt presents to the ED today with sore throat and red eyes.  Pt has been sick for about 1 week.  She did see her eye doctor for her eyes and was placed on eye drops (tobramycin dexamethasone).  She developed a sore throat, went to her doctor, and was swabbed for strep.  Strep was +, so she was given a rx for Amox.  Eyes are not getting better and throat is getting worse despite taking her meds.  The pt said she can swallow, but it is very painful to do so.   No f/c.      Past Medical History:  Diagnosis Date  . Asthma    as child  . Bulging lumbar disc   . Chronic abdominal pain   . Chronic back pain   . Chronic diarrhea   . Chronic knee pain    bilateral  . Chronic nausea   . Diverticulosis   . Headache   . History of kidney stones   . Hypertension   . IBS (irritable bowel syndrome)   . Sciatica     Patient Active Problem List   Diagnosis Date Noted  . Loose, body, joint, knee, left   . IBS (irritable bowel syndrome) 06/12/2013  . Obesity 06/12/2013  . Diverticulitis of colon (without mention of hemorrhage)(562.11) 01/26/2013  . Abdominal pain, other specified site 01/12/2013  . Disc prolapse 04/20/2012  . Sciatica 01/25/2012    Past Surgical History:  Procedure Laterality Date  . CHOLECYSTECTOMY  2004   [facility]-Dr. Arnoldo Morale  . COLONOSCOPY N/A 03/06/2013   Procedure: COLONOSCOPY;  Surgeon: Rogene Houston, MD;  Location: [facility] ENDO SUITE;  Service: Endoscopy;  Laterality: N/A;  1030  . ENDOMETRIAL ABLATION  June 2012  . KNEE ARTHROSCOPY Left 05/12/2017   Procedure: ARTHROSCOPY KNEE removal loose body;  Surgeon: Carole Civil, MD;  Location: [facility] ORS;  Service: Orthopedics;  Laterality: Left;  . KNEE ARTHROTOMY Left  05/12/2017   Procedure: KNEE ARTHROTOMY;  Surgeon: Carole Civil, MD;  Location: [facility] ORS;  Service: Orthopedics;  Laterality: Left;     OB History    Gravida  2   Para  2   Term  2   Preterm      AB      Living  2     SAB      TAB      Ectopic      Multiple      Live Births               Home Medications    Prior to Admission medications   Medication Sig Start Date End Date Taking? Authorizing Provider  amLODipine (NORVASC) 10 MG tablet Take 10 mg by mouth daily.    [provider]  aspirin EC 81 MG tablet Take 81 mg by mouth daily.    [provider]  ciprofloxacin (CILOXAN) 0.3 % ophthalmic ointment Apply 1/2 inch to each eye q4h while awake. 07/03/18   Isla Pence, MD  clindamycin (CLEOCIN) 300 MG capsule Take 1 capsule (300 mg total) by mouth 3 (three) times daily. 07/03/18   Isla Pence, MD  fluconazole (DIFLUCAN) 150 MG tablet  Take 1 tablet (150 mg total) by mouth once for 1 dose. 07/03/18 07/03/18  Isla Pence, MD  hydrochlorothiazide (HYDRODIURIL) 25 MG tablet Take 25 mg by mouth daily.    [provider]  ibuprofen (ADVIL,MOTRIN) 800 MG tablet Take 1 tablet (800 mg total) by mouth every 8 (eight) hours as needed for moderate pain. 07/03/18   Isla Pence, MD  naproxen (NAPROSYN) 500 MG tablet Take 1 tablet (500 mg total) by mouth 2 (two) times daily with a meal. Patient not taking: Reported on 06/22/2017 03/16/17   Carole Civil, MD  oxyCODONE-acetaminophen (PERCOCET/ROXICET) 5-325 MG tablet Take 1 tablet by mouth every 6 (six) hours as needed. Patient not taking: Reported on 06/22/2017 04/28/17   Milton Ferguson, MD  predniSONE (STERAPRED UNI-PAK 21 TAB) 10 MG (21) TBPK tablet Take 6 tabs for 2 days, then 5 for 2 days, then 4 for 2 days, then 3 for 2 days, 2 for 2 days, then 1 for 2 days 07/03/18   Isla Pence, MD  propranolol ER (INDERAL LA) 60 MG 24 hr capsule Take 60 mg by mouth every evening.     [provider]  tiZANidine (ZANAFLEX) 4 MG tablet Take 1 tablet (4 mg total) by mouth every 6 (six) hours as needed for muscle spasms. 06/22/17   Carole Civil, MD  traMADol (ULTRAM) 50 MG tablet Take 1 tablet (50 mg total) by mouth every 6 (six) hours as needed. Patient taking differently: Take 50 mg by mouth every 6 (six) hours as needed (for pain.).  03/16/17   Carole Civil, MD    Family History Family History  Problem Relation Age of Onset  . Heart disease Mother        heart attack  . Alzheimer's disease Mother   . Cancer Father        prostate  . Heart disease Sister        CHF  . Diabetes Brother   . Cancer Brother        prostate  . Heart disease Brother   . Stroke Maternal Grandmother   . Heart disease Maternal Grandfather        massive heart attack  . Colon cancer Neg Hx   . Colon polyps Neg Hx     Social History Social History   Tobacco Use  . Smoking status: Never Smoker  . Smokeless tobacco: Never Used  Substance Use Topics  . Alcohol use: No  . Drug use: No     Allergies   Bee venom and Vicodin [hydrocodone-acetaminophen]   Review of Systems Review of Systems  HENT: Positive for sore throat.   Eyes: Positive for discharge and redness.  All other systems reviewed and are negative.    Physical Exam Updated Vital Signs BP (!) 104/57 (BP Location: Left Arm)   Pulse 88   Temp 97.8 F (36.6 C) (Oral)   Resp 18   Ht 5\' 3"  (1.6 m)   Wt 107.5 kg   SpO2 96%   BMI 41.98 kg/m   Physical Exam  Constitutional: She is oriented to person, place, and time. She appears well-developed and well-nourished.  HENT:  Head: Normocephalic and atraumatic.  Right Ear: Hearing, tympanic membrane and ear canal normal.  Left Ear: Hearing, tympanic membrane and ear canal normal.  Mouth/Throat: Mucous membranes are dry. Oropharyngeal exudate and posterior oropharyngeal erythema present.  Eyes: Pupils are equal, round, and reactive to light. EOM are  normal. Right conjunctiva is injected. Left  conjunctiva is injected.  Slit lamp exam:      The right eye shows no corneal abrasion and no fluorescein uptake.       The left eye shows no corneal abrasion and no fluorescein uptake.  Cardiovascular: Regular rhythm, normal heart sounds and intact distal pulses. Tachycardia present.  Pulmonary/Chest: Effort normal and breath sounds normal.  Abdominal: Soft. Bowel sounds are normal.  Neurological: She is alert and oriented to person, place, and time.  Skin: Skin is warm and dry. Capillary refill takes less than 2 seconds.  Psychiatric: She has a normal mood and affect. Her behavior is normal.  Nursing note and vitals reviewed.    ED Treatments / Results  Labs (all labs ordered are listed, but only abnormal results are displayed) Labs Reviewed  COMPREHENSIVE METABOLIC PANEL - Abnormal; Notable for the following components:      Result Value   AST 14 (*)    All other components within normal limits  CBC WITH DIFFERENTIAL/PLATELET - Abnormal; Notable for the following components:   WBC 11.7 (*)    Monocytes Absolute 1.1 (*)    All other components within normal limits    EKG None  Radiology Ct Soft Tissue Neck W Contrast  Result Date: 07/03/2018 CLINICAL DATA:  Sore throat, stridor EXAM: CT NECK WITH CONTRAST TECHNIQUE: Multidetector CT imaging of the neck was performed using the standard protocol following the bolus administration of intravenous contrast. CONTRAST:  24mL OMNIPAQUE IOHEXOL 300 MG/ML  SOLN COMPARISON:  None. FINDINGS: Pharynx and larynx: Symmetric enlargement of the tonsils bilaterally without abscess. Enlargement of the soft palate and adenoid tissue bilaterally. Larynx normal. Salivary glands: No inflammation, mass, or stone. Thyroid: Negative Lymph nodes: Small submental and submandibular nodes bilaterally. Enlarged right level 2 lymph node 21 mm. Prominent left level 2 lymph nodes, 12 mm and 14 mm. Vascular: Negative  Limited intracranial: Negative Visualized orbits: Negative Mastoids and visualized paranasal sinuses: Mild mucosal edema paranasal sinuses bilaterally. Skeleton: Cervical spine degenerative change. No acute skeletal abnormality Upper chest: Negative Other: None IMPRESSION: Findings compatible with pharyngitis without abscess. Diffuse enlargement of the tonsils, adenoids, and soft palate. Mild reactive adenopathy in the neck. Reactive adenopathy in the neck. Electronically Signed   By: Franchot Gallo M.D.   On: 07/03/2018 14:13    Procedures Procedures (including critical care time)  Medications Ordered in ED Medications  ondansetron (ZOFRAN) injection 4 mg (4 mg Intravenous Given 07/03/18 1242)  dexamethasone (DECADRON) injection 10 mg (10 mg Intravenous Given 07/03/18 1245)  fentaNYL (SUBLIMAZE) injection 50 mcg (50 mcg Intravenous Given 07/03/18 1243)  fluorescein ophthalmic strip 2 strip (2 strips Both Eyes Given 07/03/18 1315)  tetracaine (PONTOCAINE) 0.5 % ophthalmic solution 2 drop (2 drops Both Eyes Given 07/03/18 1313)  clindamycin (CLEOCIN) IVPB 600 mg (0 mg Intravenous Stopped 07/03/18 1346)  iohexol (OMNIPAQUE) 300 MG/ML solution 75 mL (75 mLs Intravenous Contrast Given 07/03/18 1359)     Initial Impression / Assessment and Plan / ED Course  I have reviewed the triage vital signs and the nursing notes.  Pertinent labs & imaging results that were available during my care of the patient were reviewed by me and considered in my medical decision making (see chart for details).    Pt is feeling better.  She is instructed to change amox to clinda and change tobramycin to ciloxan.  She is encouraged to f/u with pcp and with ophthalmology.  Return if worse.  Final Clinical Impressions(s) / ED Diagnoses  Final diagnoses:  Strep pharyngitis  Acute bacterial conjunctivitis of both eyes    ED Discharge Orders         Ordered    clindamycin (CLEOCIN) 300 MG capsule  3 times daily      07/03/18 1431    predniSONE (STERAPRED UNI-PAK 21 TAB) 10 MG (21) TBPK tablet     07/03/18 1431    ibuprofen (ADVIL,MOTRIN) 800 MG tablet  Every 8 hours PRN     07/03/18 1431    ciprofloxacin (CILOXAN) 0.3 % ophthalmic ointment     07/03/18 1431    fluconazole (DIFLUCAN) 150 MG tablet   Once     07/03/18 1433           Isla Pence, MD 07/03/18 1433

## 2018-07-03 NOTE — ED Triage Notes (Signed)
Pt c/o bilateral eye redness, sharp pain and drainage and sore throat since last week. Pt was placed on eye drops and antibiotic for strep throat last Wednesday. Pt reports she hasn't gotten any better. Denies fever. Pt's tonsils are swollen and have Saralee Bolick spots present. Pt also c/o nasal congestion.

## 2018-07-03 NOTE — Discharge Instructions (Signed)
Stop tobramycin eye drops and stop oral amoxicillin.

## 2018-07-06 ENCOUNTER — Ambulatory Visit: Payer: BC Managed Care – PPO | Admitting: Orthopedic Surgery

## 2019-05-03 ENCOUNTER — Emergency Department (HOSPITAL_COMMUNITY): Payer: BC Managed Care – PPO

## 2019-05-03 ENCOUNTER — Emergency Department (HOSPITAL_COMMUNITY)
Admission: EM | Admit: 2019-05-03 | Discharge: 2019-05-03 | Disposition: A | Discharge status: Home / Self Care | Payer: BC Managed Care – PPO | Attending: Emergency Medicine | Admitting: Emergency Medicine

## 2019-05-03 ENCOUNTER — Other Ambulatory Visit: Payer: Self-pay

## 2019-05-03 ENCOUNTER — Encounter (HOSPITAL_COMMUNITY): Payer: Self-pay

## 2019-05-03 DIAGNOSIS — I10 Essential (primary) hypertension: Secondary | ICD-10-CM | POA: Insufficient documentation

## 2019-05-03 DIAGNOSIS — R1032 Left lower quadrant pain: Secondary | ICD-10-CM | POA: Diagnosis present

## 2019-05-03 DIAGNOSIS — N132 Hydronephrosis with renal and ureteral calculous obstruction: Secondary | ICD-10-CM | POA: Diagnosis not present

## 2019-05-03 DIAGNOSIS — J45909 Unspecified asthma, uncomplicated: Secondary | ICD-10-CM | POA: Diagnosis not present

## 2019-05-03 DIAGNOSIS — Z7982 Long term (current) use of aspirin: Secondary | ICD-10-CM | POA: Insufficient documentation

## 2019-05-03 DIAGNOSIS — Z79899 Other long term (current) drug therapy: Secondary | ICD-10-CM | POA: Diagnosis not present

## 2019-05-03 DIAGNOSIS — N201 Calculus of ureter: Secondary | ICD-10-CM

## 2019-05-03 DIAGNOSIS — R109 Unspecified abdominal pain: Secondary | ICD-10-CM

## 2019-05-03 LAB — URINALYSIS, ROUTINE W REFLEX MICROSCOPIC
Bilirubin Urine: NEGATIVE
Glucose, UA: NEGATIVE mg/dL
Ketones, ur: 5 mg/dL — AB
Leukocytes,Ua: NEGATIVE
Nitrite: NEGATIVE
Protein, ur: NEGATIVE mg/dL
Specific Gravity, Urine: 1.013 (ref 1.005–1.030)
pH: 7 (ref 5.0–8.0)

## 2019-05-03 LAB — COMPREHENSIVE METABOLIC PANEL
ALT: 12 U/L (ref 0–44)
AST: 16 U/L (ref 15–41)
Albumin: 3.7 g/dL (ref 3.5–5.0)
Alkaline Phosphatase: 74 U/L (ref 38–126)
Anion gap: 14 (ref 5–15)
BUN: 12 mg/dL (ref 6–20)
CO2: 23 mmol/L (ref 22–32)
Calcium: 9.2 mg/dL (ref 8.9–10.3)
Chloride: 102 mmol/L (ref 98–111)
Creatinine, Ser: 0.9 mg/dL (ref 0.44–1.00)
GFR calc Af Amer: >60 mL/min (ref >60)
GFR calc non Af Amer: >60 mL/min (ref >60)
Glucose, Bld: 91 mg/dL (ref 70–99)
Potassium: 4.2 mmol/L (ref 3.5–5.1)
Sodium: 139 mmol/L (ref 135–145)
Total Bilirubin: 0.6 mg/dL (ref 0.3–1.2)
Total Protein: 7.7 g/dL (ref 6.5–8.1)

## 2019-05-03 LAB — CBC
HCT: 39.7 % (ref 36.0–46.0)
Hemoglobin: 12.5 g/dL (ref 12.0–15.0)
MCH: 27.8 pg (ref 26.0–34.0)
MCHC: 31.5 g/dL (ref 30.0–36.0)
MCV: 88.4 fL (ref 80.0–100.0)
Platelets: 329 10*3/uL (ref 150–400)
RBC: 4.49 MIL/uL (ref 3.87–5.11)
RDW: 12.9 % (ref 11.5–15.5)
WBC: 19.3 10*3/uL — ABNORMAL HIGH (ref 4.0–10.5)
nRBC: 0 % (ref 0.0–0.2)

## 2019-05-03 LAB — HCG, QUANTITATIVE, PREGNANCY: hCG, Beta Chain, Quant, S: 1 m[IU]/mL (ref <5)

## 2019-05-03 LAB — LIPASE, BLOOD: Lipase: 31 U/L (ref 11–51)

## 2019-05-03 MED ORDER — TAMSULOSIN HCL 0.4 MG PO CAPS: 0.4000 mg | ORAL_CAPSULE | Freq: Every day | ORAL | 0 refills | Status: DC

## 2019-05-03 MED ORDER — ONDANSETRON HCL 4 MG/2ML IJ SOLN
4.0000 mg | Freq: Once | INTRAMUSCULAR | Status: AC
  Administered 2019-05-03: 4 mg via INTRAVENOUS
  Filled 2019-05-03: qty 2

## 2019-05-03 MED ORDER — IBUPROFEN 800 MG PO TABS: 800.0000 mg | ORAL_TABLET | Freq: Three times a day (TID) | ORAL | 0 refills | Status: DC

## 2019-05-03 MED ORDER — HYDROMORPHONE HCL 2 MG PO TABS: 2.0000 mg | ORAL_TABLET | Freq: Four times a day (QID) | ORAL | 0 refills | Status: DC | PRN

## 2019-05-03 MED ORDER — ONDANSETRON 4 MG PO TBDP: 4.0000 mg | ORAL_TABLET | Freq: Three times a day (TID) | ORAL | 1 refills | Status: AC | PRN

## 2019-05-03 MED ORDER — HYDROMORPHONE HCL 4 MG PO TABS: 4.0000 mg | ORAL_TABLET | Freq: Four times a day (QID) | ORAL | 0 refills | Status: DC | PRN

## 2019-05-03 MED ORDER — HYDROMORPHONE HCL 1 MG/ML IJ SOLN
1.0000 mg | Freq: Once | INTRAMUSCULAR | Status: AC
  Administered 2019-05-03: 1 mg via INTRAVENOUS
  Filled 2019-05-03: qty 1

## 2019-05-03 MED ORDER — SODIUM CHLORIDE 0.9 % IV SOLN
INTRAVENOUS | Status: DC
  Administered 2019-05-03: 12:00:00 via INTRAVENOUS

## 2019-05-03 MED ORDER — SODIUM CHLORIDE 0.9 % IV BOLUS
1000.0000 mL | Freq: Once | INTRAVENOUS | Status: AC
  Administered 2019-05-03: 1000 mL via INTRAVENOUS

## 2019-05-03 NOTE — ED Triage Notes (Signed)
Pt c/o left flank pain and vomited x 1 since 6am.  Reports history of kidney stones.  LBM was this am.

## 2019-05-03 NOTE — ED Notes (Signed)
Patient transported to CT 

## 2019-05-03 NOTE — ED Notes (Addendum)
Patient has updated her family. Patient was given ginger ail and crackers.

## 2019-05-03 NOTE — ED Provider Notes (Signed)
[facility] EMERGENCY DEPARTMENT Provider Note   CSN: [id number] Arrival date & time: 05/03/19  2202     History   Chief Complaint Chief Complaint  Patient presents with  . Flank Pain    HPI Rebecca Odom is a 48 y.o. female.     Patient with prior history of kidney stones.  Patient with the acute onset of left CVA left flank pain at 6 this morning.  Now the pain is more in the in the left lower quadrant area.  Pain is severe 10 out of 10.  Nausea and vomited once.  Patient felt fine yesterday.  Patient denies any fever or upper respiratory symptoms.     Past Medical History:  Diagnosis Date  . Asthma    as child  . Bulging lumbar disc   . Chronic abdominal pain   . Chronic back pain   . Chronic diarrhea   . Chronic knee pain    bilateral  . Chronic nausea   . Diverticulosis   . Headache   . History of kidney stones   . Hypertension   . IBS (irritable bowel syndrome)   . Sciatica     Patient Active Problem List   Diagnosis Date Noted  . Loose, body, joint, knee, left   . IBS (irritable bowel syndrome) 06/12/2013  . Obesity 06/12/2013  . Diverticulitis of colon (without mention of hemorrhage)(562.11) 01/26/2013  . Abdominal pain, other specified site 01/12/2013  . Disc prolapse 04/20/2012  . Sciatica 01/25/2012    Past Surgical History:  Procedure Laterality Date  . CHOLECYSTECTOMY  2004   [facility]-Dr. Arnoldo Morale  . COLONOSCOPY N/A 03/06/2013   Procedure: COLONOSCOPY;  Surgeon: Rogene Houston, MD;  Location: [facility];  Service: Endoscopy;  Laterality: N/A;  1030  . ENDOMETRIAL ABLATION  June 2012  . KNEE ARTHROSCOPY Left 05/12/2017   Procedure: ARTHROSCOPY KNEE removal loose body;  Surgeon: Carole Civil, MD;  Location: [facility] ORS;  Service: Orthopedics;  Laterality: Left;  . KNEE ARTHROTOMY Left 05/12/2017   Procedure: KNEE ARTHROTOMY;  Surgeon: Carole Civil, MD;  Location: [facility];  Service: Orthopedics;  Laterality: Left;     OB History    Gravida  2   Para  2   Term  2   Preterm      AB      Living  2     SAB      TAB      Ectopic      Multiple      Live Births               Home Medications    Prior to Admission medications   Medication Sig Start Date End Date Taking? Authorizing Provider  amLODipine (NORVASC) 10 MG tablet Take 10 mg by mouth daily.   Yes [provider]  aspirin EC 81 MG tablet Take 81 mg by mouth daily.   Yes [provider]  hydrochlorothiazide (HYDRODIURIL) 25 MG tablet Take 25 mg by mouth daily.   Yes [provider]  propranolol ER (INDERAL LA) 60 MG 24 hr capsule Take 60 mg by mouth every evening.    Yes [provider]  tiZANidine (ZANAFLEX) 4 MG tablet Take 1 tablet (4 mg total) by mouth every 6 (six) hours as needed for muscle spasms. 06/22/17  Yes Carole Civil, MD  Vitamin D, Ergocalciferol, (DRISDOL) 1.25 MG (50000 UT) CAPS capsule Take 1 capsule by mouth once a week.  03/27/19  Yes [provider]  ciprofloxacin (CILOXAN) 0.3 % ophthalmic ointment Apply 1/2 inch to each eye q4h while awake. Patient not taking: Reported on 05/03/2019 07/03/18   Isla Pence, MD  clindamycin (CLEOCIN) 300 MG capsule Take 1 capsule (300 mg total) by mouth 3 (three) times daily. Patient not taking: Reported on 05/03/2019 07/03/18   Isla Pence, MD  cyclobenzaprine (FLEXERIL) 5 MG tablet Take 1 tablet by mouth 3 (three) times daily. 03/27/19   [provider]  HYDROmorphone (DILAUDID) 2 MG tablet Take 1 tablet (2 mg total) by mouth every 6 (six) hours as needed for severe pain. 05/03/19   Fredia Sorrow, MD  HYDROmorphone (DILAUDID) 4 MG tablet Take 1 tablet (4 mg total) by mouth every 6 (six) hours as needed for severe pain. 05/03/19   Fredia Sorrow, MD  ibuprofen (ADVIL) 800 MG tablet Take 1 tablet (800 mg total) by mouth 3 (three) times daily. 05/03/19   Fredia Sorrow, MD  ibuprofen (ADVIL,MOTRIN) 800 MG tablet Take 1 tablet (800  mg total) by mouth every 8 (eight) hours as needed for moderate pain. Patient not taking: Reported on 05/03/2019 07/03/18   Isla Pence, MD  naproxen (NAPROSYN) 500 MG tablet Take 1 tablet (500 mg total) by mouth 2 (two) times daily with a meal. Patient not taking: Reported on 06/22/2017 03/16/17   Carole Civil, MD  ondansetron (ZOFRAN ODT) 4 MG disintegrating tablet Take 1 tablet (4 mg total) by mouth every 8 (eight) hours as needed. 05/03/19   Fredia Sorrow, MD  oxyCODONE-acetaminophen (PERCOCET/ROXICET) 5-325 MG tablet Take 1 tablet by mouth every 6 (six) hours as needed. Patient not taking: Reported on 06/22/2017 04/28/17   Milton Ferguson, MD  predniSONE (STERAPRED UNI-PAK 21 TAB) 10 MG (21) TBPK tablet Take 6 tabs for 2 days, then 5 for 2 days, then 4 for 2 days, then 3 for 2 days, 2 for 2 days, then 1 for 2 days Patient not taking: Reported on 05/03/2019 07/03/18   Isla Pence, MD  rOPINIRole (REQUIP) 0.5 MG tablet Take 1 tablet by mouth 2 (two) times a day. 03/27/19   [provider]  tamsulosin (FLOMAX) 0.4 MG CAPS capsule Take 1 capsule (0.4 mg total) by mouth at bedtime. 05/03/19   Fredia Sorrow, MD  traMADol (ULTRAM) 50 MG tablet Take 1 tablet (50 mg total) by mouth every 6 (six) hours as needed. Patient not taking: Reported on 05/03/2019 03/16/17   Carole Civil, MD    Family History Family History  Problem Relation Age of Onset  . Heart disease Mother        heart attack  . Alzheimer's disease Mother   . Cancer Father        prostate  . Heart disease Sister        CHF  . Diabetes Brother   . Cancer Brother        prostate  . Heart disease Brother   . Stroke Maternal Grandmother   . Heart disease Maternal Grandfather        massive heart attack  . Colon cancer Neg Hx   . Colon polyps Neg Hx     Social History Social History   Tobacco Use  . Smoking status: Never Smoker  . Smokeless tobacco: Never Used  Substance Use Topics  . Alcohol use: No   . Drug use: No     Allergies   Bee venom and Vicodin [hydrocodone-acetaminophen]   Review of Systems Review of Systems  Constitutional: Negative for chills and fever.  HENT: Negative for congestion, rhinorrhea and sore throat.   Eyes: Negative for visual disturbance.  Respiratory: Negative for cough and shortness of breath.   Cardiovascular: Negative for chest pain and leg swelling.  Gastrointestinal: Positive for abdominal pain, nausea and vomiting. Negative for diarrhea.  Genitourinary: Positive for flank pain. Negative for dysuria and hematuria.  Musculoskeletal: Positive for back pain. Negative for neck pain.  Skin: Negative for rash.  Neurological: Negative for dizziness, light-headedness and headaches.  Hematological: Does not bruise/bleed easily.  Psychiatric/Behavioral: Negative for confusion.     Physical Exam Updated Vital Signs BP (!) 143/76   Pulse 97   Temp 98.5 F (36.9 C) (Oral)   Resp 17   Ht 1.549 m (5\' 1" )   Wt 106.6 kg   SpO2 97%   BMI 44.40 kg/m   Physical Exam Vitals signs and nursing note reviewed.  Constitutional:      General: She is in acute distress.     Appearance: She is well-developed.  HENT:     Head: Normocephalic and atraumatic.  Eyes:     Extraocular Movements: Extraocular movements intact.     Conjunctiva/sclera: Conjunctivae normal.     Pupils: Pupils are equal, round, and reactive to light.  Neck:     Musculoskeletal: Normal range of motion and neck supple.  Cardiovascular:     Rate and Rhythm: Normal rate and regular rhythm.     Heart sounds: No murmur.  Pulmonary:     Effort: Pulmonary effort is normal. No respiratory distress.     Breath sounds: Normal breath sounds.  Abdominal:     Palpations: Abdomen is soft.     Tenderness: There is no abdominal tenderness.  Musculoskeletal:        General: No swelling.  Skin:    General: Skin is warm and dry.     Capillary Refill: Capillary refill takes less than 2 seconds.   Neurological:     General: No focal deficit present.     Mental Status: She is alert and oriented to person, place, and time.      ED Treatments / Results  Labs (all labs ordered are listed, but only abnormal results are displayed) Labs Reviewed  CBC - Abnormal; Notable for the following components:      Result Value   WBC 19.3 (*)    All other components within normal limits  URINALYSIS, ROUTINE W REFLEX MICROSCOPIC - Abnormal; Notable for the following components:   Color, Urine STRAW (*)    APPearance HAZY (*)    Hgb urine dipstick MODERATE (*)    Ketones, ur 5 (*)    Bacteria, UA RARE (*)    All other components within normal limits  COMPREHENSIVE METABOLIC PANEL  LIPASE, BLOOD  HCG, QUANTITATIVE, PREGNANCY    EKG    Radiology Ct Renal Stone Study  Result Date: 05/03/2019 CLINICAL DATA:  Left flank pain, vomiting EXAM: CT ABDOMEN AND PELVIS WITHOUT CONTRAST TECHNIQUE: Multidetector CT imaging of the abdomen and pelvis was performed following the standard protocol without IV contrast. COMPARISON:  01/26/2014 FINDINGS: Lower chest: No acute abnormality. Hepatobiliary: No focal liver abnormality is seen. Status post cholecystectomy. No biliary dilatation. Pancreas: Unremarkable. No pancreatic ductal dilatation or surrounding inflammatory changes. Spleen: Normal in size without significant abnormality. Adrenals/Urinary Tract: Adrenal glands are unremarkable. There is a 3 mm calculus in the middle third of the left ureter with mild associated left hydronephrosis, hydroureter, and perinephric fat stranding.  There is an additional nonobstructive small calculus of the inferior pole of the left kidney. Bladder is unremarkable. Stomach/Bowel: Stomach is within normal limits. Appendix appears normal. No evidence of bowel wall thickening, distention, or inflammatory changes. Sigmoid diverticulosis. Vascular/Lymphatic: No significant vascular findings are present. No enlarged abdominal or  pelvic lymph nodes. Reproductive: No mass or other significant abnormality. Other: No abdominal wall hernia or abnormality. No abdominopelvic ascites. Musculoskeletal: No acute or significant osseous findings. IMPRESSION: There is a 3 mm calculus in the middle third of the left ureter with mild associated left hydronephrosis, hydroureter, and perinephric fat stranding. There is an additional nonobstructive small calculus of the inferior pole of the left kidney. Electronically Signed   By: Eddie Candle M.D.   On: 05/03/2019 12:23    Procedures Procedures (including critical care time)  Medications Ordered in ED Medications  0.9 %  sodium chloride infusion ( Intravenous Stopped 05/03/19 1444)  sodium chloride 0.9 % bolus 1,000 mL (0 mLs Intravenous Stopped 05/03/19 1211)  ondansetron (ZOFRAN) injection 4 mg (4 mg Intravenous Given 05/03/19 1112)  HYDROmorphone (DILAUDID) injection 1 mg (1 mg Intravenous Given 05/03/19 1112)  HYDROmorphone (DILAUDID) injection 1 mg (1 mg Intravenous Given 05/03/19 1354)  ondansetron (ZOFRAN) injection 4 mg (4 mg Intravenous Given 05/03/19 1353)     Initial Impression / Assessment and Plan / ED Course  I have reviewed the triage vital signs and the nursing notes.  Pertinent labs & imaging results that were available during my care of the patient were reviewed by me and considered in my medical decision making (see chart for details).       CT scan shows a 3 mm left ureteral stone about mid way down.  No significant renal abnormalities BUN and creatinine within normal limits.  Urinalysis without evidence of infection.  Patient's pain improved here with hydromorphone.  Patient has significant allergy to high to hydrocodone.  Patient we discharged with hydromorphone antinausea medicine Flomax and Motrin and follow-up with urology.  Urinalysis not consistent with infection.   Final Clinical Impressions(s) / ED Diagnoses   Final diagnoses:  Left flank pain  Left  ureteral stone    ED Discharge Orders         Ordered    HYDROmorphone (DILAUDID) 4 MG tablet  Every 6 hours PRN     05/03/19 1447    HYDROmorphone (DILAUDID) 2 MG tablet  Every 6 hours PRN     05/03/19 1451    ondansetron (ZOFRAN ODT) 4 MG disintegrating tablet  Every 8 hours PRN     05/03/19 1452    ibuprofen (ADVIL) 800 MG tablet  3 times daily     05/03/19 1452    tamsulosin (FLOMAX) 0.4 MG CAPS capsule  Daily at bedtime     05/03/19 1454           Fredia Sorrow, MD 05/03/19 1652

## 2019-05-03 NOTE — Discharge Instructions (Signed)
Call urology for follow-up.  Take your pain medication and then take the Motrin as directed.  Take the Zofran for nausea.  Would expect you to improve and 2 days.  Return for any new or worse symptoms.

## 2019-08-18 ENCOUNTER — Other Ambulatory Visit (HOSPITAL_COMMUNITY): Payer: Self-pay | Admitting: Family Medicine

## 2019-08-18 DIAGNOSIS — Z1231 Encounter for screening mammogram for malignant neoplasm of breast: Secondary | ICD-10-CM

## 2019-09-01 ENCOUNTER — Ambulatory Visit (HOSPITAL_COMMUNITY): Payer: BC Managed Care – PPO

## 2019-09-11 ENCOUNTER — Ambulatory Visit (HOSPITAL_COMMUNITY): Payer: BC Managed Care – PPO

## 2019-11-14 ENCOUNTER — Emergency Department (HOSPITAL_COMMUNITY)
Admission: EM | Admit: 2019-11-14 | Discharge: 2019-11-14 | Disposition: A | Discharge status: Home / Self Care | Payer: BC Managed Care – PPO | Attending: Emergency Medicine | Admitting: Emergency Medicine

## 2019-11-14 ENCOUNTER — Other Ambulatory Visit: Payer: Self-pay

## 2019-11-14 ENCOUNTER — Emergency Department (HOSPITAL_COMMUNITY): Payer: BC Managed Care – PPO

## 2019-11-14 ENCOUNTER — Encounter (HOSPITAL_COMMUNITY): Payer: Self-pay | Admitting: Emergency Medicine

## 2019-11-14 DIAGNOSIS — Z7982 Long term (current) use of aspirin: Secondary | ICD-10-CM | POA: Diagnosis not present

## 2019-11-14 DIAGNOSIS — I10 Essential (primary) hypertension: Secondary | ICD-10-CM | POA: Insufficient documentation

## 2019-11-14 DIAGNOSIS — M791 Myalgia, unspecified site: Secondary | ICD-10-CM | POA: Insufficient documentation

## 2019-11-14 DIAGNOSIS — R079 Chest pain, unspecified: Secondary | ICD-10-CM | POA: Insufficient documentation

## 2019-11-14 DIAGNOSIS — Z79899 Other long term (current) drug therapy: Secondary | ICD-10-CM | POA: Insufficient documentation

## 2019-11-14 DIAGNOSIS — M546 Pain in thoracic spine: Secondary | ICD-10-CM | POA: Insufficient documentation

## 2019-11-14 DIAGNOSIS — G8929 Other chronic pain: Secondary | ICD-10-CM | POA: Insufficient documentation

## 2019-11-14 DIAGNOSIS — M542 Cervicalgia: Secondary | ICD-10-CM | POA: Diagnosis present

## 2019-11-14 DIAGNOSIS — M7918 Myalgia, other site: Secondary | ICD-10-CM

## 2019-11-14 DIAGNOSIS — J45909 Unspecified asthma, uncomplicated: Secondary | ICD-10-CM | POA: Diagnosis not present

## 2019-11-14 DIAGNOSIS — X500XXA Overexertion from strenuous movement or load, initial encounter: Secondary | ICD-10-CM | POA: Diagnosis not present

## 2019-11-14 LAB — TROPONIN I (HIGH SENSITIVITY)
Troponin I (High Sensitivity): 11 ng/L (ref <18)
Troponin I (High Sensitivity): 11 ng/L (ref <18)

## 2019-11-14 LAB — CBC WITH DIFFERENTIAL/PLATELET
Abs Immature Granulocytes: 0.07 10*3/uL (ref 0.00–0.07)
Basophils Absolute: 0 10*3/uL (ref 0.0–0.1)
Basophils Relative: 0 %
Eosinophils Absolute: 0.3 10*3/uL (ref 0.0–0.5)
Eosinophils Relative: 2 %
HCT: 39.4 % (ref 36.0–46.0)
Hemoglobin: 12.3 g/dL (ref 12.0–15.0)
Immature Granulocytes: 1 %
Lymphocytes Relative: 34 %
Lymphs Abs: 3.9 10*3/uL (ref 0.7–4.0)
MCH: 27.6 pg (ref 26.0–34.0)
MCHC: 31.2 g/dL (ref 30.0–36.0)
MCV: 88.5 fL (ref 80.0–100.0)
Monocytes Absolute: 0.7 10*3/uL (ref 0.1–1.0)
Monocytes Relative: 6 %
Neutro Abs: 6.4 10*3/uL (ref 1.7–7.7)
Neutrophils Relative %: 57 %
Platelets: 379 10*3/uL (ref 150–400)
RBC: 4.45 MIL/uL (ref 3.87–5.11)
RDW: 12.7 % (ref 11.5–15.5)
WBC: 11.3 10*3/uL — ABNORMAL HIGH (ref 4.0–10.5)
nRBC: 0 % (ref 0.0–0.2)

## 2019-11-14 LAB — BASIC METABOLIC PANEL
Anion gap: 11 (ref 5–15)
BUN: 13 mg/dL (ref 6–20)
CO2: 26 mmol/L (ref 22–32)
Calcium: 9.1 mg/dL (ref 8.9–10.3)
Chloride: 102 mmol/L (ref 98–111)
Creatinine, Ser: 0.71 mg/dL (ref 0.44–1.00)
GFR calc Af Amer: >60 mL/min (ref >60)
GFR calc non Af Amer: >60 mL/min (ref >60)
Glucose, Bld: 96 mg/dL (ref 70–99)
Potassium: 4 mmol/L (ref 3.5–5.1)
Sodium: 139 mmol/L (ref 135–145)

## 2019-11-14 LAB — D-DIMER, QUANTITATIVE: D-Dimer, Quant: 1.96 ug/mL-FEU — ABNORMAL HIGH (ref 0.00–0.50)

## 2019-11-14 MED ORDER — TRAMADOL HCL 50 MG PO TABS: 50.0000 mg | ORAL_TABLET | Freq: Four times a day (QID) | ORAL | 0 refills | Status: DC | PRN

## 2019-11-14 MED ORDER — TIZANIDINE HCL 4 MG PO CAPS: 4.0000 mg | ORAL_CAPSULE | Freq: Three times a day (TID) | ORAL | 0 refills | Status: DC

## 2019-11-14 MED ORDER — KETOROLAC TROMETHAMINE 30 MG/ML IJ SOLN
30.0000 mg | Freq: Once | INTRAMUSCULAR | Status: AC
  Administered 2019-11-14: 11:00:00 30 mg via INTRAVENOUS
  Filled 2019-11-14: qty 1

## 2019-11-14 MED ORDER — IOHEXOL 350 MG/ML SOLN
100.0000 mL | Freq: Once | INTRAVENOUS | Status: AC | PRN
  Administered 2019-11-14: 100 mL via INTRAVENOUS

## 2019-11-14 NOTE — ED Notes (Signed)
Patient transported to X-ray 

## 2019-11-14 NOTE — ED Triage Notes (Signed)
PT c/o neck pain and middle/upper back pain after picking up something heavy about 10 days ago.

## 2019-11-14 NOTE — ED Notes (Signed)
Pt given peanut butter crackers and sprite per MD approval.

## 2019-11-14 NOTE — Discharge Instructions (Signed)
Apply ice packs on and off to your shoulder, upper back and chest.  Stop your current muscle relaxer and start the one prescribed today.  Follow-up with your primary doctor for recheck

## 2019-11-14 NOTE — ED Provider Notes (Signed)
[facility] EMERGENCY DEPARTMENT Provider Note   CSN: [id number] Arrival date & time: 11/14/19  0730     History Chief Complaint  Patient presents with  . Neck Pain    BROGHAN MCFALLS is a 48 y.o. female.  HPI      FLORMARIA MOILANEN is a 48 y.o. female with past medical history of chronic back pain and hypertension who presents to the Emergency Department complaining of right mid back pain that began approximately one week ago after lifting frozen turkeys off a truck.  She describes feeling a "pulling" sensation to her right upper to mid back that began to improve after taking muscle relaxer's and NSAID.  Three days ago, she felt a sharp pain to her right back that radiated to her right chest, base of her neck and across the top of her shoulders, worse on the left.  She describes the pain as constant and dull, but worsens with deep breathing and certain movements.  She denies pain, weakness or numbness of the upper extremities, headache, dizziness, shortness of breath, fever, cough or chills.      Past Medical History:  Diagnosis Date  . Asthma    as child  . Bulging lumbar disc   . Chronic abdominal pain   . Chronic back pain   . Chronic diarrhea   . Chronic knee pain    bilateral  . Chronic nausea   . Diverticulosis   . Headache   . History of kidney stones   . Hypertension   . IBS (irritable bowel syndrome)   . Sciatica     Patient Active Problem List   Diagnosis Date Noted  . Loose, body, joint, knee, left   . IBS (irritable bowel syndrome) 06/12/2013  . Obesity 06/12/2013  . Diverticulitis of colon (without mention of hemorrhage)(562.11) 01/26/2013  . Abdominal pain, other specified site 01/12/2013  . Disc prolapse 04/20/2012  . Sciatica 01/25/2012    Past Surgical History:  Procedure Laterality Date  . CHOLECYSTECTOMY  2004   [facility]-Dr. Arnoldo Morale  . COLONOSCOPY N/A 03/06/2013   Procedure: COLONOSCOPY;  Surgeon: Rogene Houston, MD;  Location: [facility] ENDO SUITE;   Service: Endoscopy;  Laterality: N/A;  1030  . ENDOMETRIAL ABLATION  June 2012  . KNEE ARTHROSCOPY Left 05/12/2017   Procedure: ARTHROSCOPY KNEE removal loose body;  Surgeon: Carole Civil, MD;  Location: [facility] ORS;  Service: Orthopedics;  Laterality: Left;  . KNEE ARTHROTOMY Left 05/12/2017   Procedure: KNEE ARTHROTOMY;  Surgeon: Carole Civil, MD;  Location: [facility] ORS;  Service: Orthopedics;  Laterality: Left;     OB History    Gravida  2   Para  2   Term  2   Preterm      AB      Living  2     SAB      TAB      Ectopic      Multiple      Live Births              Family History  Problem Relation Age of Onset  . Heart disease Mother        heart attack  . Alzheimer's disease Mother   . Cancer Father        prostate  . Heart disease Sister        CHF  . Diabetes Brother   . Cancer Brother        prostate  . Heart disease  Brother   . Stroke Maternal Grandmother   . Heart disease Maternal Grandfather        massive heart attack  . Colon cancer Neg Hx   . Colon polyps Neg Hx     Social History   Tobacco Use  . Smoking status: Never Smoker  . Smokeless tobacco: Never Used  Substance Use Topics  . Alcohol use: No  . Drug use: No    Home Medications Prior to Admission medications   Medication Sig Start Date End Date Taking? Authorizing Provider  amLODipine (NORVASC) 10 MG tablet Take 10 mg by mouth daily.    [provider]  aspirin EC 81 MG tablet Take 81 mg by mouth daily.    [provider]  ciprofloxacin (CILOXAN) 0.3 % ophthalmic ointment Apply 1/2 inch to each eye q4h while awake. Patient not taking: Reported on 05/03/2019 07/03/18   Isla Pence, MD  clindamycin (CLEOCIN) 300 MG capsule Take 1 capsule (300 mg total) by mouth 3 (three) times daily. Patient not taking: Reported on 05/03/2019 07/03/18   Isla Pence, MD  cyclobenzaprine (FLEXERIL) 5 MG tablet Take 1 tablet by mouth 3 (three) times daily. 03/27/19    [provider]  hydrochlorothiazide (HYDRODIURIL) 25 MG tablet Take 25 mg by mouth daily.    [provider]  HYDROmorphone (DILAUDID) 2 MG tablet Take 1 tablet (2 mg total) by mouth every 6 (six) hours as needed for severe pain. 05/03/19   Fredia Sorrow, MD  HYDROmorphone (DILAUDID) 4 MG tablet Take 1 tablet (4 mg total) by mouth every 6 (six) hours as needed for severe pain. 05/03/19   Fredia Sorrow, MD  ibuprofen (ADVIL) 800 MG tablet Take 1 tablet (800 mg total) by mouth 3 (three) times daily. 05/03/19   Fredia Sorrow, MD  ibuprofen (ADVIL,MOTRIN) 800 MG tablet Take 1 tablet (800 mg total) by mouth every 8 (eight) hours as needed for moderate pain. Patient not taking: Reported on 05/03/2019 07/03/18   Isla Pence, MD  naproxen (NAPROSYN) 500 MG tablet Take 1 tablet (500 mg total) by mouth 2 (two) times daily with a meal. Patient not taking: Reported on 06/22/2017 03/16/17   Carole Civil, MD  ondansetron (ZOFRAN ODT) 4 MG disintegrating tablet Take 1 tablet (4 mg total) by mouth every 8 (eight) hours as needed. 05/03/19   Fredia Sorrow, MD  oxyCODONE-acetaminophen (PERCOCET/ROXICET) 5-325 MG tablet Take 1 tablet by mouth every 6 (six) hours as needed. Patient not taking: Reported on 06/22/2017 04/28/17   Milton Ferguson, MD  predniSONE (STERAPRED UNI-PAK 21 TAB) 10 MG (21) TBPK tablet Take 6 tabs for 2 days, then 5 for 2 days, then 4 for 2 days, then 3 for 2 days, 2 for 2 days, then 1 for 2 days Patient not taking: Reported on 05/03/2019 07/03/18   Isla Pence, MD  propranolol ER (INDERAL LA) 60 MG 24 hr capsule Take 60 mg by mouth every evening.     [provider]  rOPINIRole (REQUIP) 0.5 MG tablet Take 1 tablet by mouth 2 (two) times a day. 03/27/19   [provider]  tamsulosin (FLOMAX) 0.4 MG CAPS capsule Take 1 capsule (0.4 mg total) by mouth at bedtime. 05/03/19   Fredia Sorrow, MD  tiZANidine (ZANAFLEX) 4 MG tablet Take 1 tablet (4 mg  total) by mouth every 6 (six) hours as needed for muscle spasms. 06/22/17   Carole Civil, MD  traMADol (ULTRAM) 50 MG tablet Take 1 tablet (50 mg total)  by mouth every 6 (six) hours as needed. Patient not taking: Reported on 05/03/2019 03/16/17   Carole Civil, MD  Vitamin D, Ergocalciferol, (DRISDOL) 1.25 MG (50000 UT) CAPS capsule Take 1 capsule by mouth once a week. 03/27/19   [provider]    Allergies    Bee venom and Vicodin [hydrocodone-acetaminophen]  Review of Systems   Review of Systems  Constitutional: Negative for chills and fever.  Eyes: Negative for visual disturbance.  Respiratory: Negative for cough and shortness of breath.   Cardiovascular: Positive for chest pain.  Gastrointestinal: Negative for abdominal pain, constipation and vomiting.  Genitourinary: Negative for decreased urine volume, difficulty urinating, dysuria, flank pain and hematuria.  Musculoskeletal: Positive for back pain and neck pain. Negative for joint swelling.  Skin: Negative for rash.  Neurological: Negative for dizziness, syncope, weakness, numbness and headaches.    Physical Exam Updated Vital Signs BP 130/60 (BP Location: Left Arm)   Pulse 98   Temp 98 F (36.7 C) (Oral)   Resp 18   Ht 5\' 3"  (1.6 m)   Wt 106.6 kg   SpO2 99%   BMI 41.63 kg/m   Physical Exam Vitals and nursing note reviewed.  Constitutional:      General: She is not in acute distress.    Appearance: Normal appearance. She is well-developed.  HENT:     Head: Normocephalic.     Mouth/Throat:     Mouth: Mucous membranes are moist.  Eyes:     Extraocular Movements: Extraocular movements intact.  Neck:     Comments: Tenderness to palpation of the bilateral cervical paraspinal muscles, left greater than right.  No tenderness along the cervical, thoracic or lumbar spine. Cardiovascular:     Rate and Rhythm: Normal rate and regular rhythm.     Pulses: Normal pulses.     Comments: DP pulses are strong  and palpable bilaterally Pulmonary:     Effort: Pulmonary effort is normal. No respiratory distress.     Breath sounds: Normal breath sounds.  Abdominal:     General: There is no distension.     Palpations: Abdomen is soft.     Tenderness: There is no abdominal tenderness.  Musculoskeletal:        General: Tenderness present.     Cervical back: Normal range of motion and neck supple. Tenderness present.     Lumbar back: Tenderness present. No swelling, deformity or lacerations. Normal range of motion.     Comments: ttp of the right thoracic paraspinal muscles and bilateral trapezius muscles with left > right.   Pt has 5/5 strength against resistance of bilateral upper and lower extremities.     Skin:    General: Skin is warm and dry.     Capillary Refill: Capillary refill takes less than 2 seconds.     Findings: No rash.  Neurological:     General: No focal deficit present.     Mental Status: She is alert.     Sensory: Sensation is intact. No sensory deficit.     Motor: Motor function is intact. No weakness or abnormal muscle tone.     Coordination: Coordination is intact.     Gait: Gait is intact. Gait normal.     Deep Tendon Reflexes:     Reflex Scores:      Patellar reflexes are 2+ on the right side and 2+ on the left side.      Achilles reflexes are 2+ on the right side and 2+  on the left side.    Comments: CN II-XII grossly intact.  Speech clear, no pronator drift, normal finger-nose testing.  Patient ambulates in the department with steady gait.     ED Results / Procedures / Treatments   Labs (all labs ordered are listed, but only abnormal results are displayed) Labs Reviewed  D-DIMER, QUANTITATIVE (NOT AT [facility]) - Abnormal; Notable for the following components:      Result Value   D-Dimer, Quant 1.96 (*)    All other components within normal limits  CBC WITH DIFFERENTIAL/PLATELET - Abnormal; Notable for the following components:   WBC 11.3 (*)    All other components  within normal limits  BASIC METABOLIC PANEL  TROPONIN I (HIGH SENSITIVITY)  TROPONIN I (HIGH SENSITIVITY)    EKG EKG Interpretation  Date/Time:  Tuesday November 14 2019 09:06:49 EST Ventricular Rate:  95 PR Interval:    QRS Duration: 78 QT Interval:  360 QTC Calculation: 453 R Axis:   33 Text Interpretation: Sinus rhythm improved t wave inversions from prior 6/18 Confirmed by Aletta Edouard [phone]) on 11/14/2019 9:13:16 AM   Radiology DG Ribs Unilateral W/Chest Right  Result Date: 11/14/2019 CLINICAL DATA:  Pain after lifting heavy object EXAM: RIGHT RIBS AND CHEST - 3+ VIEW COMPARISON:  None. FINDINGS: Frontal chest as well as oblique and cone-down rib images were obtained. Lungs are clear. Heart size and pulmonary vascularity are normal. No adenopathy. No pneumothorax or pleural effusion. No evident rib fracture. IMPRESSION: No evident rib fracture.  Lungs clear. Electronically Signed   By: Lowella Grip III M.D.   On: 11/14/2019 08:39   CT Angio Chest PE W and/or Wo Contrast  Result Date: 11/14/2019 CLINICAL DATA:  Shortness of breath and right-sided chest pain EXAM: CT ANGIOGRAPHY CHEST WITH CONTRAST TECHNIQUE: Multidetector CT imaging of the chest was performed using the standard protocol during bolus administration of intravenous contrast. Multiplanar CT image reconstructions and MIPs were obtained to evaluate the vascular anatomy. CONTRAST:  160mL OMNIPAQUE IOHEXOL 350 MG/ML SOLN COMPARISON:  Plain film from earlier in the same day. FINDINGS: Cardiovascular: Heart is mildly enlarged in size. The thoracic aorta shows a normal branching pattern with the exception of the left vertebral artery arising directly from the aortic arch. No aneurysmal dilatation is seen. No significant atherosclerotic changes are noted. Pulmonary artery shows a normal branching pattern. No definitive filling defects are noted to suggest pulmonary embolism. Mediastinum/Nodes: Thoracic inlet is within  normal limits. No significant hilar or mediastinal adenopathy is noted. The esophagus as visualized is within normal limits. Lungs/Pleura: The lungs are well aerated bilaterally. No focal infiltrate or sizable effusion is seen. No parenchymal nodule is noted. Upper Abdomen: Peripelvic cysts are noted on the left. The remainder of the upper abdomen appears within normal limits. Musculoskeletal: Degenerative changes of the thoracic spine are seen. No acute bony abnormality is noted. Review of the MIP images confirms the above findings. IMPRESSION: No evidence of pulmonary emboli. Mild cardiomegaly. No focal infiltrate or effusion is seen. Electronically Signed   By: Inez Catalina M.D.   On: 11/14/2019 12:34    Procedures Procedures (including critical care time)  Medications Ordered in ED Medications - No data to display  ED Course  I have reviewed the triage vital signs and the nursing notes.  Pertinent labs & imaging results that were available during my care of the patient were reviewed by me and considered in my medical decision making (see chart for details).  Clinical Course  as of Nov 14 1251  Tue Nov 13, 9060  4414 48 year old female complaining of back chest and neck trapezius pain for 3 days.  Possibly related to lifting something.  EKG and cardiac testing has been unremarkable.  Elevated D-dimer but CT angio not showing any PE.  Likely discharge with pain control and outpatient follow-up.   [MB]    Clinical Course User Index [MB] Hayden Rasmussen, MD   MDM Rules/Calculators/A&P                      Patient with pleuritic, right-sided chest pain bilateral trapezius muscle pain in right upper back pain.  Symptoms began after heavy lifting, worsened 3 days ago.  CT PE study is negative.  Symptoms are felt to be musculoskeletal.  No concerning symptoms for ACS and work-up today has been reassuring.  Patient feeling better after medication given here.  She remains neurovascularly intact.   Agrees to close f/u with PCP.  Return precautions discussed.      Final Clinical Impression(s) / ED Diagnoses Final diagnoses:  Musculoskeletal pain    Rx / DC Orders ED Discharge Orders    None       Kem Parkinson, PA-C 11/14/19 1449    Hayden Rasmussen, MD 11/14/19 [phone]

## 2020-01-22 ENCOUNTER — Other Ambulatory Visit (HOSPITAL_COMMUNITY): Payer: Self-pay | Admitting: Family Medicine

## 2020-01-22 ENCOUNTER — Other Ambulatory Visit: Payer: Self-pay

## 2020-01-22 ENCOUNTER — Ambulatory Visit (HOSPITAL_COMMUNITY)
Admission: RE | Admit: 2020-01-22 | Discharge: 2020-01-22 | Disposition: A | Discharge status: Home / Self Care | Payer: BC Managed Care – PPO | Source: Ambulatory Visit | Attending: Family Medicine | Admitting: Family Medicine

## 2020-01-22 DIAGNOSIS — M542 Cervicalgia: Secondary | ICD-10-CM | POA: Insufficient documentation

## 2020-01-28 ENCOUNTER — Other Ambulatory Visit: Payer: Self-pay

## 2020-01-28 ENCOUNTER — Encounter (HOSPITAL_COMMUNITY): Payer: Self-pay | Admitting: Emergency Medicine

## 2020-01-28 ENCOUNTER — Emergency Department (HOSPITAL_COMMUNITY)
Admission: EM | Admit: 2020-01-28 | Discharge: 2020-01-28 | Disposition: A | Discharge status: Home / Self Care | Payer: BC Managed Care – PPO | Attending: Emergency Medicine | Admitting: Emergency Medicine

## 2020-01-28 DIAGNOSIS — I10 Essential (primary) hypertension: Secondary | ICD-10-CM | POA: Insufficient documentation

## 2020-01-28 DIAGNOSIS — M5412 Radiculopathy, cervical region: Secondary | ICD-10-CM | POA: Diagnosis not present

## 2020-01-28 DIAGNOSIS — Z7982 Long term (current) use of aspirin: Secondary | ICD-10-CM | POA: Diagnosis not present

## 2020-01-28 DIAGNOSIS — Z79899 Other long term (current) drug therapy: Secondary | ICD-10-CM | POA: Diagnosis not present

## 2020-01-28 DIAGNOSIS — J45909 Unspecified asthma, uncomplicated: Secondary | ICD-10-CM | POA: Insufficient documentation

## 2020-01-28 DIAGNOSIS — M542 Cervicalgia: Secondary | ICD-10-CM

## 2020-01-28 MED ORDER — PREDNISONE 10 MG PO TABS: 20.0000 mg | ORAL_TABLET | Freq: Two times a day (BID) | ORAL | 0 refills | Status: DC

## 2020-01-28 MED ORDER — TRAMADOL HCL 50 MG PO TABS: 50.0000 mg | ORAL_TABLET | Freq: Four times a day (QID) | ORAL | 0 refills | Status: DC | PRN

## 2020-01-28 NOTE — Discharge Instructions (Addendum)
Begin taking prednisone as prescribed.  Begin taking tramadol as prescribed as needed for pain.  Follow-up with your primary doctor if symptoms or not improving in the next week.

## 2020-01-28 NOTE — ED Provider Notes (Signed)
[place] [facility] Provider Note   CSN: [id number] Arrival date & time: 01/28/20  0805     History Chief Complaint  Patient presents with  . Neck Pain    Rebecca Odom is a 49 y.o. female.  Patient is a 49 year old female with history of HTN, asthma, irritable bowel, and chronic pain of multiple areas.  She presents today with complaints of neck pain.  This has been present for the past week and began in the absence of any injury or trauma.  She describes pain to her neck that radiates into her left arm.  It is worse when she tries to sleep.  She denies any weakness or numbness.  She denies any bowel or bladder complaints.  She had a virtual visit with her primary doctor and was prescribed NSAIDs and Flexeril, however this is not helping her pain.  The history is provided by the patient.  Neck Pain Pain location:  L side Quality:  Stabbing and stiffness Stiffness is present:  All day Pain radiates to:  L arm Pain severity:  Severe Onset quality:  Sudden Duration:  1 week Timing:  Constant Progression:  Worsening Chronicity:  New Relieved by:  Nothing Ineffective treatments:  Muscle relaxants      Past Medical History:  Diagnosis Date  . Asthma    as child  . Bulging lumbar disc   . Chronic abdominal pain   . Chronic back pain   . Chronic diarrhea   . Chronic knee pain    bilateral  . Chronic nausea   . Diverticulosis   . Headache   . History of kidney stones   . Hypertension   . IBS (irritable bowel syndrome)   . Sciatica     Patient Active Problem List   Diagnosis Date Noted  . Loose, body, joint, knee, left   . IBS (irritable bowel syndrome) 06/12/2013  . Obesity 06/12/2013  . Diverticulitis of colon (without mention of hemorrhage)(562.11) 01/26/2013  . Abdominal pain, other specified site 01/12/2013  . Disc prolapse 04/20/2012  . Sciatica 01/25/2012    Past Surgical History:  Procedure Laterality Date  . CHOLECYSTECTOMY  2004   [facility]-Dr. Arnoldo Morale  . COLONOSCOPY N/A 03/06/2013   Procedure: COLONOSCOPY;  Surgeon: Rogene Houston, MD;  Location[facility];  Service: Endoscopy;  Laterality: N/A;  1030  . ENDOMETRIAL ABLATION  June 2012  . KNEE ARTHROSCOPY Left 05/12/2017   Procedure: ARTHROSCOPY KNEE removal loose body;  Surgeon: Carole Civil, MD;  Location: [facility] ORS;  Service: Orthopedics;  Laterality: Left;  . KNEE ARTHROTOMY Left 05/12/2017   Procedure: KNEE ARTHROTOMY;  Surgeon: Carole Civil, MD;  Location: [facility] ORS;  Service: Orthopedics;  Laterality: Left;     OB History    Gravida  2   Para  2   Term  2   Preterm      AB      Living  2     SAB      TAB      Ectopic      Multiple      Live Births              Family History  Problem Relation Age of Onset  . Heart disease Mother        heart attack  . Alzheimer's disease Mother   . Cancer Father        prostate  . Heart disease Sister  CHF  . Diabetes Brother   . Cancer Brother        prostate  . Heart disease Brother   . Stroke Maternal Grandmother   . Heart disease Maternal Grandfather        massive heart attack  . Colon cancer Neg Hx   . Colon polyps Neg Hx     Social History   Tobacco Use  . Smoking status: Never Smoker  . Smokeless tobacco: Never Used  Substance Use Topics  . Alcohol use: No  . Drug use: No    Home Medications Prior to Admission medications   Medication Sig Start Date End Date Taking? Authorizing Provider  amLODipine (NORVASC) 10 MG tablet Take 10 mg by mouth daily.    [provider]  aspirin EC 81 MG tablet Take 81 mg by mouth daily.    [provider]  ciprofloxacin (CILOXAN) 0.3 % ophthalmic ointment Apply 1/2 inch to each eye q4h while awake. Patient not taking: Reported on 05/03/2019 07/03/18   Isla Pence, MD  clindamycin (CLEOCIN) 300 MG capsule Take 1 capsule (300 mg total) by mouth 3 (three) times daily. Patient not taking: Reported on 05/03/2019  07/03/18   Isla Pence, MD  hydrochlorothiazide (HYDRODIURIL) 25 MG tablet Take 25 mg by mouth daily.    [provider]  HYDROmorphone (DILAUDID) 2 MG tablet Take 1 tablet (2 mg total) by mouth every 6 (six) hours as needed for severe pain. 05/03/19   Fredia Sorrow, MD  HYDROmorphone (DILAUDID) 4 MG tablet Take 1 tablet (4 mg total) by mouth every 6 (six) hours as needed for severe pain. 05/03/19   Fredia Sorrow, MD  ibuprofen (ADVIL) 800 MG tablet Take 1 tablet (800 mg total) by mouth 3 (three) times daily. 05/03/19   Fredia Sorrow, MD  ibuprofen (ADVIL,MOTRIN) 800 MG tablet Take 1 tablet (800 mg total) by mouth every 8 (eight) hours as needed for moderate pain. Patient not taking: Reported on 05/03/2019 07/03/18   Isla Pence, MD  naproxen (NAPROSYN) 500 MG tablet Take 1 tablet (500 mg total) by mouth 2 (two) times daily with a meal. Patient not taking: Reported on 06/22/2017 03/16/17   Carole Civil, MD  ondansetron (ZOFRAN ODT) 4 MG disintegrating tablet Take 1 tablet (4 mg total) by mouth every 8 (eight) hours as needed. 05/03/19   Fredia Sorrow, MD  oxyCODONE-acetaminophen (PERCOCET/ROXICET) 5-325 MG tablet Take 1 tablet by mouth every 6 (six) hours as needed. Patient not taking: Reported on 06/22/2017 04/28/17   Milton Ferguson, MD  predniSONE (STERAPRED UNI-PAK 21 TAB) 10 MG (21) TBPK tablet Take 6 tabs for 2 days, then 5 for 2 days, then 4 for 2 days, then 3 for 2 days, 2 for 2 days, then 1 for 2 days Patient not taking: Reported on 05/03/2019 07/03/18   Isla Pence, MD  propranolol ER (INDERAL LA) 60 MG 24 hr capsule Take 60 mg by mouth every evening.     [provider]  rOPINIRole (REQUIP) 0.5 MG tablet Take 1 tablet by mouth 2 (two) times a day. 03/27/19   [provider]  tamsulosin (FLOMAX) 0.4 MG CAPS capsule Take 1 capsule (0.4 mg total) by mouth at bedtime. 05/03/19   Fredia Sorrow, MD  tiZANidine (ZANAFLEX) 4 MG capsule Take 1 capsule (4  mg total) by mouth 3 (three) times daily. 11/14/19   Triplett, Tammy, PA-C  traMADol (ULTRAM) 50 MG tablet Take 1 tablet (50 mg total) by mouth every 6 (six)  hours as needed. 11/14/19   Triplett, Tammy, PA-C  Vitamin D, Ergocalciferol, (DRISDOL) 1.25 MG (50000 UT) CAPS capsule Take 1 capsule by mouth once a week. 03/27/19   [provider]    Allergies    Bee venom and Vicodin [hydrocodone-acetaminophen]  Review of Systems   Review of Systems  Musculoskeletal: Positive for neck pain.  All other systems reviewed and are negative.   Physical Exam Updated Vital Signs Ht 5\' 4"  (1.626 m)   Wt 104.3 kg   BMI 39.48 kg/m   Physical Exam Vitals and nursing note reviewed.  Constitutional:      General: She is not in acute distress.    Appearance: Normal appearance. She is not ill-appearing.  HENT:     Head: Normocephalic and atraumatic.  Neck:     Comments: There is tenderness to palpation to the soft tissues of the left posterior neck.  There is no bony tenderness or step-off.  He has good range of motion. Pulmonary:     Effort: Pulmonary effort is normal.  Skin:    General: Skin is warm and dry.  Neurological:     Mental Status: She is alert.     Comments: The left arm appears grossly normal.  Strength is 5 out of 5 in both upper extremities.  She is able to flex, extend, and oppose all fingers.  Motor and sensation is intact throughout the entire hand.  Ulnar and radial pulses are easily palpable.     ED Results / Procedures / Treatments   Labs (all labs ordered are listed, but only abnormal results are displayed) Labs Reviewed - No data to display  EKG None  Radiology No results found.  Procedures Procedures (including critical care time)  Medications Ordered in ED Medications - No data to display  ED Course  I have reviewed the triage vital signs and the nursing notes.  Pertinent labs & imaging results that were available during my care of the patient  were reviewed by me and considered in my medical decision making (see chart for details).    MDM Rules/Calculators/A&P  Patient with radicular neck pain.  This will be treated with prednisone and tramadol and continued Flexeril.  She is to follow-up with her primary doctor if not improving in the next week.  There are no red flags that would suggest an emergently surgical situation.  Final Clinical Impression(s) / ED Diagnoses Final diagnoses:  None    Rx / DC Orders ED Discharge Orders    None       Veryl Speak, MD 01/28/20 [phone]

## 2020-01-28 NOTE — ED Triage Notes (Signed)
Pt c/o of left sided neck and shoulder pain since Monday. Pt saw PCP, given xrays and muscle relaxer's with no relief. Last dose 0100

## 2020-06-10 ENCOUNTER — Other Ambulatory Visit: Payer: Self-pay | Admitting: Family Medicine

## 2020-06-10 DIAGNOSIS — R1011 Right upper quadrant pain: Secondary | ICD-10-CM

## 2020-10-29 ENCOUNTER — Other Ambulatory Visit (HOSPITAL_COMMUNITY): Payer: Self-pay | Admitting: Family Medicine

## 2020-10-29 DIAGNOSIS — M25562 Pain in left knee: Secondary | ICD-10-CM

## 2020-11-05 ENCOUNTER — Ambulatory Visit (HOSPITAL_COMMUNITY)
Admission: RE | Admit: 2020-11-05 | Discharge: 2020-11-05 | Disposition: A | Discharge status: Home / Self Care | Payer: BC Managed Care – PPO | Source: Ambulatory Visit | Attending: Family Medicine | Admitting: Family Medicine

## 2020-11-05 ENCOUNTER — Other Ambulatory Visit: Payer: Self-pay

## 2020-11-05 DIAGNOSIS — M25562 Pain in left knee: Secondary | ICD-10-CM | POA: Insufficient documentation

## 2021-01-10 ENCOUNTER — Encounter: Payer: Self-pay | Admitting: Orthopedic Surgery

## 2021-02-17 ENCOUNTER — Ambulatory Visit: Payer: Self-pay | Admitting: Orthopedic Surgery

## 2021-03-13 ENCOUNTER — Ambulatory Visit: Payer: Self-pay | Admitting: Orthopedic Surgery

## 2021-04-24 ENCOUNTER — Ambulatory Visit: Payer: Self-pay | Admitting: Orthopedic Surgery

## 2021-05-01 ENCOUNTER — Ambulatory Visit: Payer: BC Managed Care – PPO | Admitting: Orthopedic Surgery

## 2021-05-15 ENCOUNTER — Encounter: Payer: Self-pay | Admitting: Orthopedic Surgery

## 2021-05-15 ENCOUNTER — Ambulatory Visit: Payer: BC Managed Care – PPO | Admitting: Orthopedic Surgery

## 2021-05-15 ENCOUNTER — Other Ambulatory Visit: Payer: Self-pay

## 2021-05-15 ENCOUNTER — Ambulatory Visit: Payer: BC Managed Care – PPO

## 2021-05-15 VITALS — BP 149/71 | HR 105 | Ht 64.0 in | Wt 230.0 lb

## 2021-05-15 DIAGNOSIS — I10 Essential (primary) hypertension: Secondary | ICD-10-CM | POA: Insufficient documentation

## 2021-05-15 DIAGNOSIS — G5601 Carpal tunnel syndrome, right upper limb: Secondary | ICD-10-CM | POA: Diagnosis not present

## 2021-05-15 DIAGNOSIS — M79641 Pain in right hand: Secondary | ICD-10-CM | POA: Diagnosis not present

## 2021-05-15 DIAGNOSIS — M170 Bilateral primary osteoarthritis of knee: Secondary | ICD-10-CM | POA: Insufficient documentation

## 2021-05-15 DIAGNOSIS — R202 Paresthesia of skin: Secondary | ICD-10-CM | POA: Diagnosis not present

## 2021-05-15 DIAGNOSIS — J329 Chronic sinusitis, unspecified: Secondary | ICD-10-CM | POA: Insufficient documentation

## 2021-05-15 MED ORDER — GABAPENTIN 300 MG PO CAPS: 300.0000 mg | ORAL_CAPSULE | Freq: Every day | ORAL | 0 refills | Status: DC

## 2021-05-15 MED ORDER — MELOXICAM 7.5 MG PO TABS: 7.5000 mg | ORAL_TABLET | Freq: Every day | ORAL | 5 refills | Status: AC

## 2021-05-15 MED ORDER — VITAMIN B-6 100 MG PO TABS
100.0000 mg | ORAL_TABLET | Freq: Two times a day (BID) | ORAL | 0 refills | Status: AC
Start: 2021-05-15 — End: ?

## 2021-05-15 NOTE — Patient Instructions (Addendum)
Wear brace at night   Start these medications   Meds ordered this encounter  Medications   meloxicam (MOBIC) 7.5 MG tablet    Sig: Take 1 tablet (7.5 mg total) by mouth daily.    Dispense:  30 tablet    Refill:  5   gabapentin (NEURONTIN) 300 MG capsule    Sig: Take 1 capsule (300 mg total) by mouth at bedtime.    Dispense:  30 capsule    Refill:  0   pyridOXINE (VITAMIN B-6) 100 MG tablet    Sig: Take 1 tablet (100 mg total) by mouth 2 (two) times daily.    Dispense:  90 tablet    Refill:  0   Nerve study ordered   Driving Directions to [employer] from [employer] address is [place] The phone number is [phone]  Dr. Kennon Portela office will call you to schedule the nerve study   1. Start out going [place] on [address] toward [address].  Then 0.02 miles0.02 total miles 2. Take the 1st right onto [address]. Continue to follow [address].  If you reach [facility] you've gone a little too far  Then 0.58 miles0.60 total miles 3. Turn right onto [address] is just past [facility]  Then 2.25 miles2.85 total miles 4. Take the [address] ramp toward [place].  Then 0.25 miles3.10 total miles 5. Merge onto [address]  Then 18.17 miles21.28 total miles 6. Merge onto [address]  Then 1.47 miles22.74 total miles 7. Turn right onto [address] is just past [place]  Then 0.11 miles22.85 total miles  8. [address] [place], [place] [address], [place] is on the left.

## 2021-05-15 NOTE — Progress Notes (Signed)
NEW PROBLEM//OFFICE VISIT  Summary assessment and plan:   50 year old female with presumed carpal tunnel syndrome recommend bracing oral medication vitamin B6 gabapentin and nerve conduction study  Meds ordered this encounter  Medications   meloxicam (MOBIC) 7.5 MG tablet    Sig: Take 1 tablet (7.5 mg total) by mouth daily.    Dispense:  30 tablet    Refill:  5   gabapentin (NEURONTIN) 300 MG capsule    Sig: Take 1 capsule (300 mg total) by mouth at bedtime.    Dispense:  30 capsule    Refill:  0   pyridOXINE (VITAMIN B-6) 100 MG tablet    Sig: Take 1 tablet (100 mg total) by mouth 2 (two) times daily.    Dispense:  90 tablet    Refill:  0     Chief Complaint  Patient presents with   Hand Pain    Rt hand pain, swelling, and numbness on and off for years.    50 year old female with longstanding numbness tingling night pain right hand associated with some swelling difficulty writing and holding fine objects  Patient says she gets a lot of pain at night and some occasionally during the day no trauma and no previous treatment    MEDICAL DECISION MAKING  A.  Encounter Diagnoses  Name Primary?   Pain in right hand    Carpal tunnel syndrome of right wrist Yes    B. DATA ANALYSED:   IMAGING: Interpretation of images: Hand x-ray mild oa cmc joint   Orders: Nerve study  Outside records reviewed: None   C. MANAGEMENT   As above  Meds ordered this encounter  Medications   meloxicam (MOBIC) 7.5 MG tablet    Sig: Take 1 tablet (7.5 mg total) by mouth daily.    Dispense:  30 tablet    Refill:  5   gabapentin (NEURONTIN) 300 MG capsule    Sig: Take 1 capsule (300 mg total) by mouth at bedtime.    Dispense:  30 capsule    Refill:  0   pyridOXINE (VITAMIN B-6) 100 MG tablet    Sig: Take 1 tablet (100 mg total) by mouth 2 (two) times daily.    Dispense:  90 tablet    Refill:  0      BP (!) 149/71   Pulse (!) 105   Ht 5\' 4"  (1.626 m)   Wt 230 lb (104.3 kg)    BMI 39.48 kg/m    General appearance: Well-developed well-nourished no gross deformities  Cardiovascular normal pulse and perfusion normal color without edema  Neurologically no sensation loss or deficits or pathologic reflexes  Psychological: Awake alert and oriented x3 mood and affect normal  Skin no lacerations or ulcerations no nodularity no palpable masses, no erythema or nodularity  Musculoskeletal: Right hand  MP joints of the index and long finger have some soft tissue swelling, full range of motion no weakness or atrophy positive Tinel's, positive Phalen's, positive carpal tunnel compression test, global sensory loss but no soft touch loss   Review of Systems  Constitutional:  Negative for fever.  Musculoskeletal:  Positive for joint pain.    Past Medical History:  Diagnosis Date   Asthma    as child   Bulging lumbar disc    Chronic abdominal pain    Chronic back pain    Chronic diarrhea    Chronic knee pain    bilateral   Chronic nausea    Diverticulosis  Headache    History of kidney stones    Hypertension    IBS (irritable bowel syndrome)    Sciatica     Past Surgical History:  Procedure Laterality Date   CHOLECYSTECTOMY  2004   [facility]-Dr. Arnoldo Morale   COLONOSCOPY N/A 03/06/2013   Procedure: COLONOSCOPY;  Surgeon: Rogene Houston, MD;  Location: [facility];  Service: Endoscopy;  Laterality: N/A;  1030   ENDOMETRIAL ABLATION  June 2012   KNEE ARTHROSCOPY Left 05/12/2017   Procedure: ARTHROSCOPY KNEE removal loose body;  Surgeon: Carole Civil, MD;  Location: [facility];  Service: Orthopedics;  Laterality: Left;   KNEE ARTHROTOMY Left 05/12/2017   Procedure: KNEE ARTHROTOMY;  Surgeon: Carole Civil, MD;  Location: [facility];  Service: Orthopedics;  Laterality: Left;    Family History  Problem Relation Age of Onset   Heart disease Mother        heart attack   Alzheimer's disease Mother    Cancer Father        prostate   Heart disease Sister         CHF   Diabetes Brother    Cancer Brother        prostate   Heart disease Brother    Stroke Maternal Grandmother    Heart disease Maternal Grandfather        massive heart attack   Colon cancer Neg Hx    Colon polyps Neg Hx    Social History   Tobacco Use   Smoking status: Never   Smokeless tobacco: Never  Vaping Use   Vaping Use: Never used  Substance Use Topics   Alcohol use: No   Drug use: No    Allergies  Allergen Reactions   Bee Venom Shortness Of Breath and Swelling    Was prescribed epi-pen   Vicodin [Hydrocodone-Acetaminophen] Anaphylaxis    Current Meds  Medication Sig   amLODipine (NORVASC) 10 MG tablet Take 10 mg by mouth daily.   aspirin EC 81 MG tablet Take 81 mg by mouth daily.   gabapentin (NEURONTIN) 300 MG capsule Take 1 capsule (300 mg total) by mouth at bedtime.   hydrochlorothiazide (HYDRODIURIL) 25 MG tablet Take 25 mg by mouth daily.   meloxicam (MOBIC) 7.5 MG tablet Take 1 tablet (7.5 mg total) by mouth daily.   propranolol ER (INDERAL LA) 60 MG 24 hr capsule Take 60 mg by mouth every evening.    pyridOXINE (VITAMIN B-6) 100 MG tablet Take 1 tablet (100 mg total) by mouth 2 (two) times daily.   rOPINIRole (REQUIP) 0.5 MG tablet Take 1 tablet by mouth 2 (two) times a day.   tamsulosin (FLOMAX) 0.4 MG CAPS capsule Take 1 capsule (0.4 mg total) by mouth at bedtime.        Arther Abbott, MD  05/15/2021 4:26 PM

## 2021-05-16 ENCOUNTER — Telehealth: Payer: Self-pay | Admitting: Physical Medicine and Rehabilitation

## 2021-05-16 NOTE — Telephone Encounter (Signed)
Pt called stating she has a referral and missed a call to set it up; she will be free @ 3 today and would ike to be tried again.   [phone]

## 2021-05-16 NOTE — Telephone Encounter (Signed)
Patient returned call asked for a call back to schedule a NCS with Dr Ernestina Patches  The number to contact patient is  [phone]

## 2021-05-20 ENCOUNTER — Telehealth: Payer: Self-pay

## 2021-05-20 NOTE — Telephone Encounter (Signed)
Patient called she is returning your phone call, call back:[phone]

## 2021-05-21 ENCOUNTER — Ambulatory Visit: Payer: Self-pay | Admitting: Orthopedic Surgery

## 2021-05-21 NOTE — Telephone Encounter (Signed)
Scheduled

## 2021-05-21 NOTE — Telephone Encounter (Signed)
See other message. Will reach out to patient to schedule.

## 2021-05-21 NOTE — Telephone Encounter (Signed)
See other note. Scheduled.

## 2021-05-26 ENCOUNTER — Ambulatory Visit: Payer: BC Managed Care – PPO | Admitting: Orthopedic Surgery

## 2021-06-03 IMAGING — DX DG KNEE COMPLETE 4+V*L*
4 series · 4 of 4 positions shown · non-contrast
Comparison: MRI 04/28/2017

CLINICAL DATA: Left knee pain.

EXAM:
LEFT KNEE - COMPLETE 4+ VIEW

[knee ap]
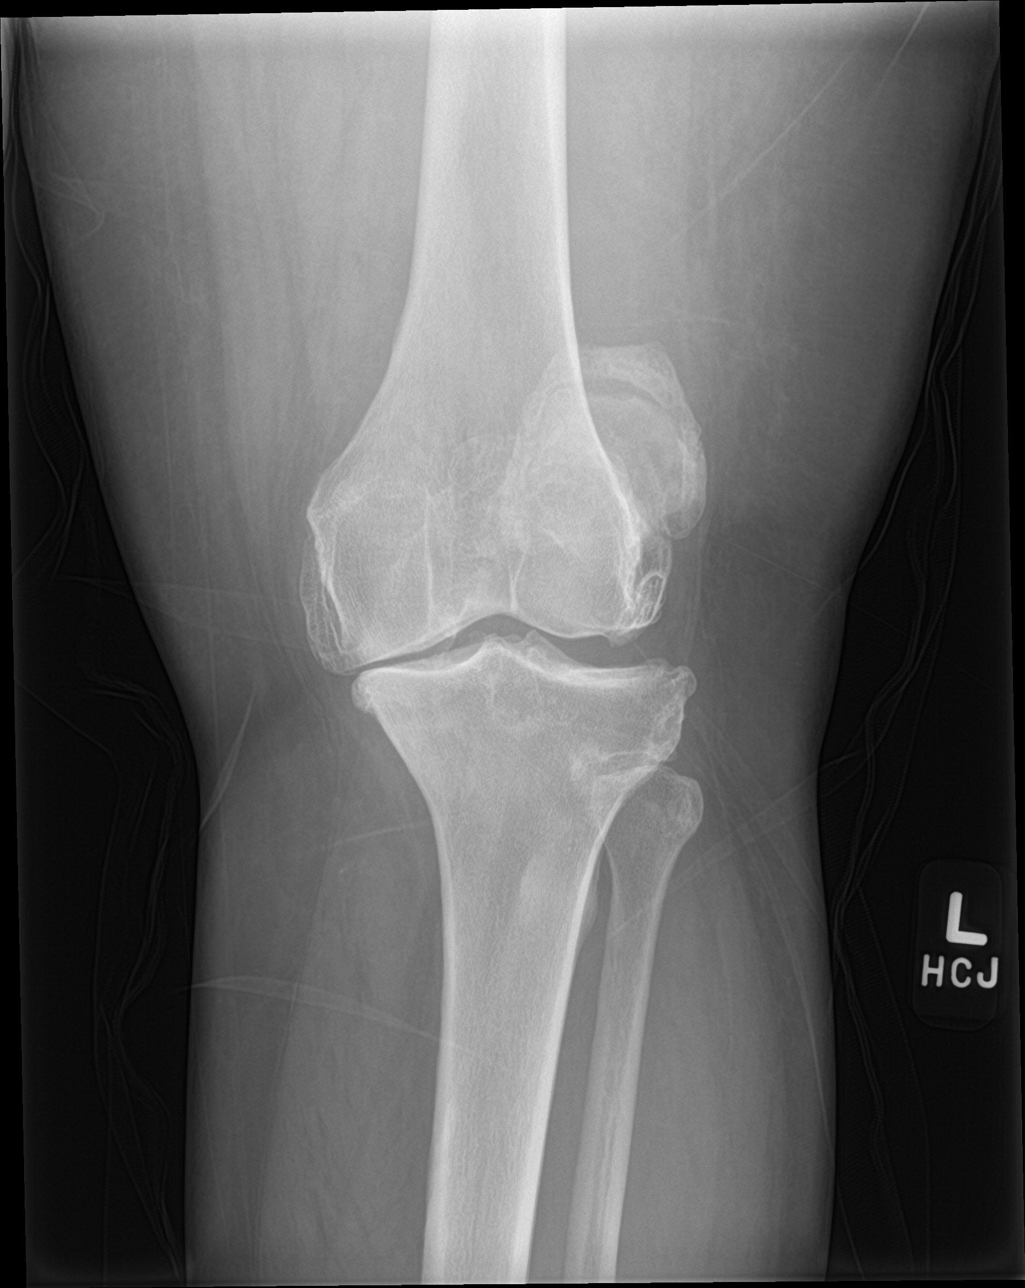

[knee obl (1 of 2)]
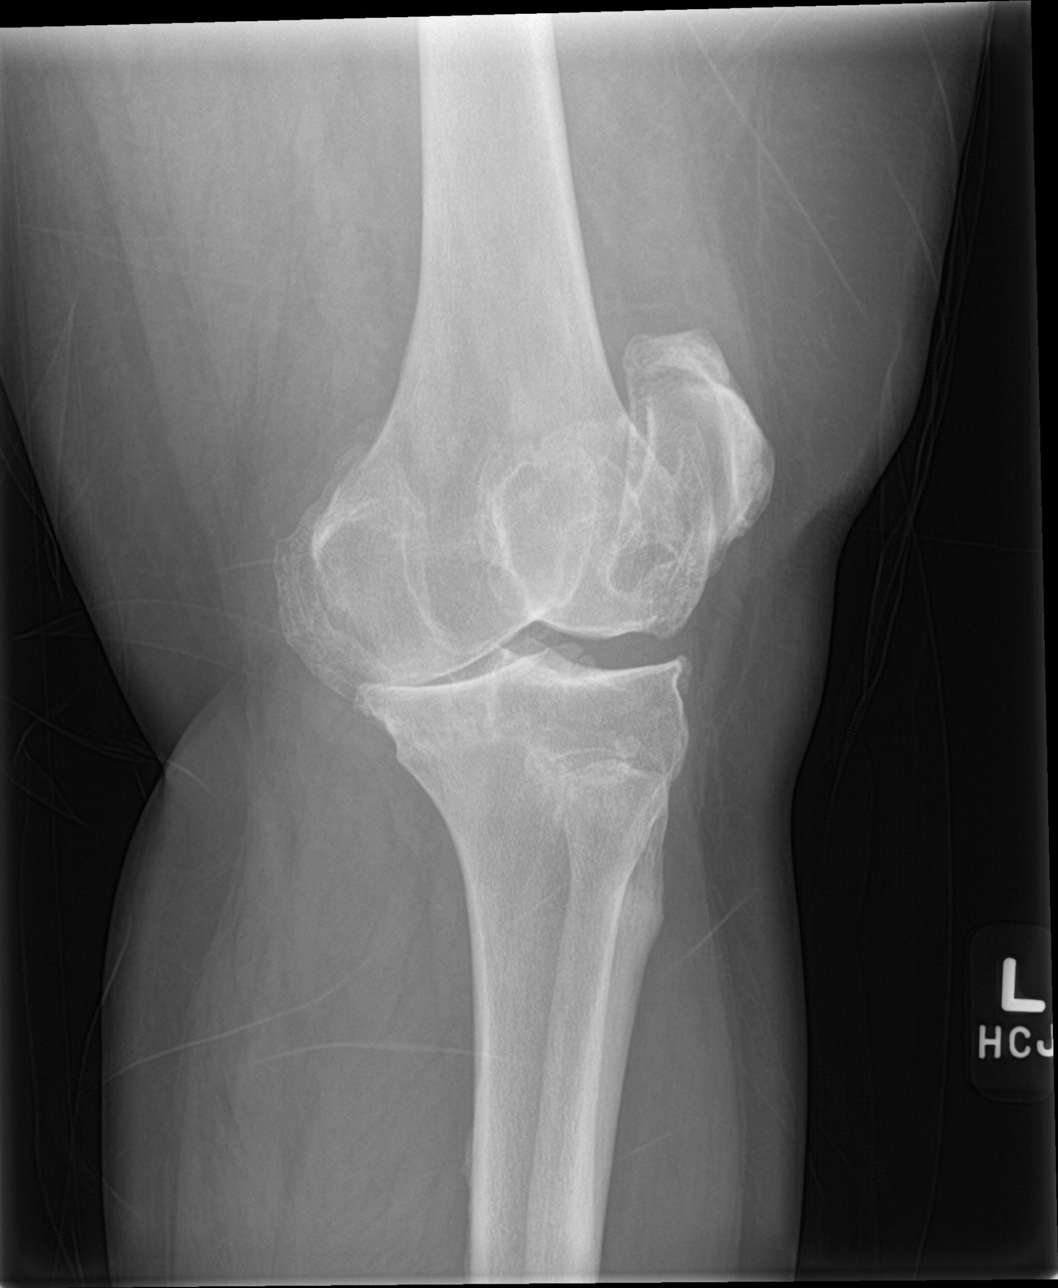

[knee obl (2 of 2)]
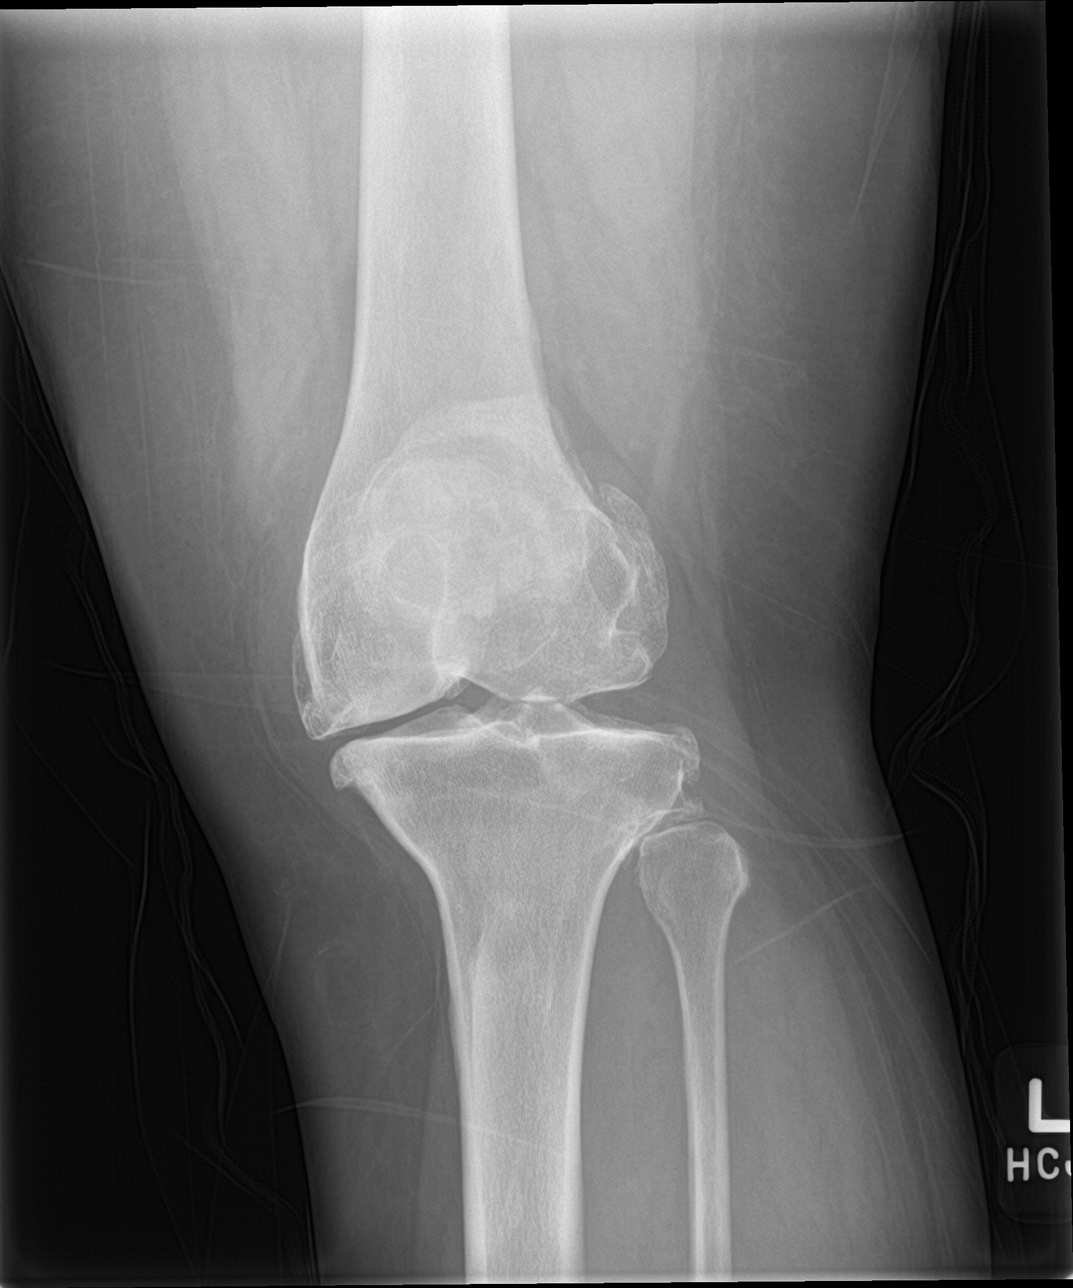

[knee lat]
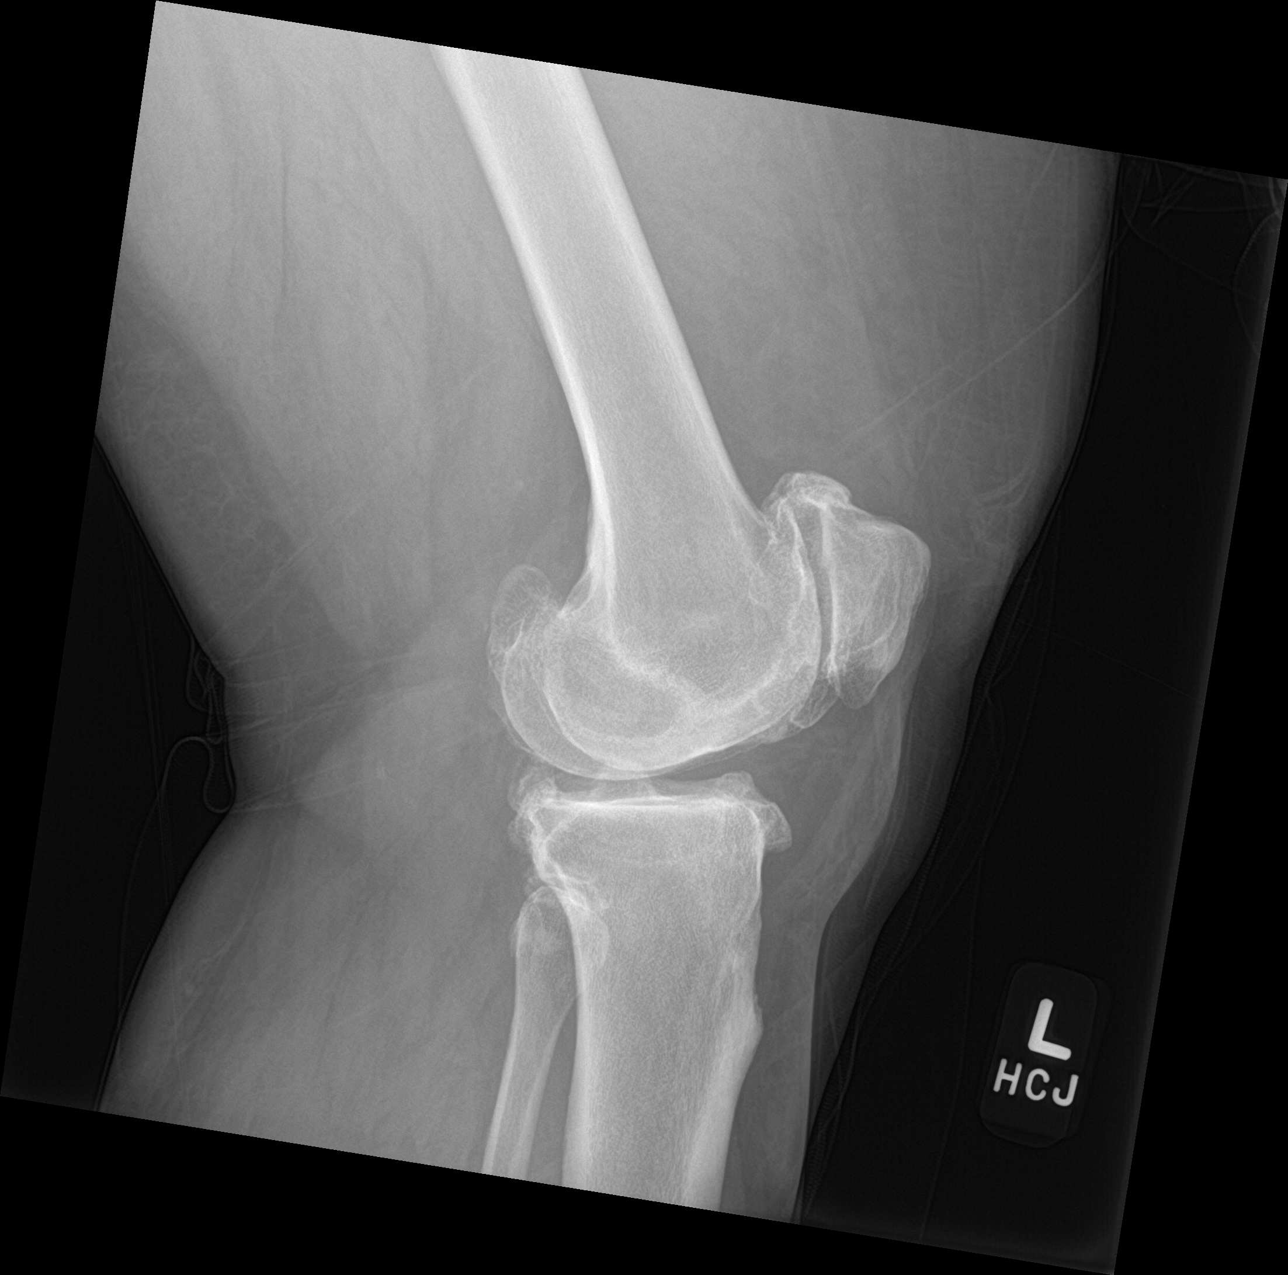

[4 of 4 positions shown; findings below may reference images not displayed]

FINDINGS: There is no joint effusion. No acute fracture or dislocation
identified. Severe medial compartment narrowing with marginal spur
formation. Moderate lateral compartment and patellofemoral
compartment narrowing with marginal spur formation. Intra-articular,
loose body inferior to the patella is again identified.
IMPRESSION: 1. No acute findings.
2. Tricompartmental osteoarthritis, severe in the medial
compartment.

## 2021-06-09 ENCOUNTER — Other Ambulatory Visit: Payer: Self-pay

## 2021-06-09 ENCOUNTER — Encounter: Payer: Self-pay | Admitting: Orthopedic Surgery

## 2021-06-09 ENCOUNTER — Ambulatory Visit: Payer: BC Managed Care – PPO

## 2021-06-09 ENCOUNTER — Ambulatory Visit (INDEPENDENT_AMBULATORY_CARE_PROVIDER_SITE_OTHER): Payer: BC Managed Care – PPO | Admitting: Orthopedic Surgery

## 2021-06-09 VITALS — BP 133/91 | HR 94 | Ht 63.0 in | Wt 235.0 lb

## 2021-06-09 DIAGNOSIS — M1710 Unilateral primary osteoarthritis, unspecified knee: Secondary | ICD-10-CM

## 2021-06-09 DIAGNOSIS — M5410 Radiculopathy, site unspecified: Secondary | ICD-10-CM

## 2021-06-09 DIAGNOSIS — M1712 Unilateral primary osteoarthritis, left knee: Secondary | ICD-10-CM

## 2021-06-09 DIAGNOSIS — M4726 Other spondylosis with radiculopathy, lumbar region: Secondary | ICD-10-CM | POA: Diagnosis not present

## 2021-06-09 DIAGNOSIS — M545 Low back pain, unspecified: Secondary | ICD-10-CM

## 2021-06-09 MED ORDER — PREDNISONE 10 MG (48) PO TBPK: ORAL_TABLET | Freq: Every day | ORAL | 0 refills | Status: DC

## 2021-06-09 MED ORDER — METHOCARBAMOL 500 MG PO TABS: 500.0000 mg | ORAL_TABLET | Freq: Three times a day (TID) | ORAL | 3 refills | Status: AC

## 2021-06-09 MED ORDER — HYDROCODONE-ACETAMINOPHEN 5-325 MG PO TABS
1.0000 | ORAL_TABLET | Freq: Four times a day (QID) | ORAL | 0 refills | Status: AC | PRN
Start: 2021-06-09 — End: 2021-06-14

## 2021-06-09 NOTE — Patient Instructions (Signed)
We need a 10 pound weight loss to do your knee replacement  For the back Ragona start some physical therapy and put you on a muscle relaxer and anti-inflammatory antipain medication continue your gabapentin  Follow-up 5 weeks

## 2021-06-09 NOTE — Progress Notes (Signed)
Chief Complaint  Patient presents with   Knee Pain    Left knee pain/has sx on this knee a couple of years ago/no known injury/ date of surgery 05/03/2017   50 year old female with moderate varus deformity and osteoarthritis severe medial compartment left knee pain for years status post arthroscopy left knee in 2018 presents with worsening left knee pain  Review of systems reveals that she has some lower back pain which radiates down to the left leg inhibiting her ambulation.  Also has difficulty walking with the back pain and leg pain are exacerbated  Currently takes Mobic or Advil gabapentin for pain without good relief   Past Medical History:  Diagnosis Date   Asthma    as child   Bulging lumbar disc    Chronic abdominal pain    Chronic back pain    Chronic diarrhea    Chronic knee pain    bilateral   Chronic nausea    Diverticulosis    Headache    History of kidney stones    Hypertension    IBS (irritable bowel syndrome)    Sciatica    Past Surgical History:  Procedure Laterality Date   CHOLECYSTECTOMY  2004   [facility]-Dr. Arnoldo Morale   COLONOSCOPY N/A 03/06/2013   Procedure: COLONOSCOPY;  Surgeon: Rogene Houston, MD;  [facility];  Service: Endoscopy;  Laterality: N/A;  1030   ENDOMETRIAL ABLATION  June 2012   KNEE ARTHROSCOPY Left 05/12/2017   Procedure: ARTHROSCOPY KNEE removal loose body;  Surgeon: Carole Civil, MD;  Location: [facility];  Service: Orthopedics;  Laterality: Left;   KNEE ARTHROTOMY Left 05/12/2017   Procedure: KNEE ARTHROTOMY;  Surgeon: Carole Civil, MD;  Location: [facility];  Service: Orthopedics;  Laterality: Left;   No orders of the defined types were placed in this encounter.  BP (!) 133/91   Pulse 94   Ht '5\' 3"'$  (1.6 m)   Wt 235 lb (106.6 kg)   BMI 41.63 kg/m   The patient meets the AMA guidelines for Morbid (severe) obesity with a BMI > 40.0 and I have recommended weight loss.  Normal appearing except for the obesity the leg is  in varus the medial pain is noted with palpable tenderness flexion is limited to 100 degrees she has a flexion contracture 5 degrees without instability  She also has lower back tenderness straight leg raise is equivocal no weakness is noted in dorsiflexion or plantarflexion  No reflex abnormalities  X-rays from November 05, 2020 at [facility] radiology  My interpretation  Show severe arthritis of the medial compartment with bone-on-bone contact severe osteophytes moderate varus  I ordered an x-ray of the lower back  The x-ray shows extensive facet arthritis and disc space narrowing at L5-S1  She will start physical therapy  Try to lose 10 pounds  The following medicines to try to get back pain under control  Meds ordered this encounter  Medications   methocarbamol (ROBAXIN) 500 MG tablet    Sig: Take 1 tablet (500 mg total) by mouth 3 (three) times daily for 7 days.    Dispense:  21 tablet    Refill:  3   predniSONE (STERAPRED UNI-PAK 48 TAB) 10 MG (48) TBPK tablet    Sig: Take by mouth daily. DS FOR 10 DAYS    Dispense:  48 tablet    Refill:  0   HYDROcodone-acetaminophen (NORCO/VICODIN) 5-325 MG tablet    Sig: Take 1 tablet by mouth every 6 (  six) hours as needed for up to 5 days for moderate pain.    Dispense:  20 tablet    Refill:  0

## 2021-07-02 ENCOUNTER — Ambulatory Visit (HOSPITAL_COMMUNITY): Payer: BC Managed Care – PPO | Admitting: Physical Therapy

## 2021-07-15 ENCOUNTER — Telehealth: Payer: Self-pay | Admitting: Physical Medicine and Rehabilitation

## 2021-07-15 NOTE — Telephone Encounter (Signed)
Pt called requesting to reschedule appt. Pt has to work and cant get off. Please call pt at [phone].

## 2021-07-16 ENCOUNTER — Encounter: Payer: BC Managed Care – PPO | Admitting: Physical Medicine and Rehabilitation

## 2021-07-16 NOTE — Telephone Encounter (Signed)
Called patient and rescheduled appointment

## 2021-07-23 ENCOUNTER — Ambulatory Visit (HOSPITAL_COMMUNITY): Payer: BC Managed Care – PPO | Attending: Orthopedic Surgery | Admitting: Physical Therapy

## 2021-07-23 DIAGNOSIS — M6281 Muscle weakness (generalized): Secondary | ICD-10-CM | POA: Insufficient documentation

## 2021-07-23 DIAGNOSIS — R2689 Other abnormalities of gait and mobility: Secondary | ICD-10-CM | POA: Insufficient documentation

## 2021-07-23 DIAGNOSIS — M25561 Pain in right knee: Secondary | ICD-10-CM | POA: Insufficient documentation

## 2021-07-23 DIAGNOSIS — M545 Low back pain, unspecified: Secondary | ICD-10-CM | POA: Insufficient documentation

## 2021-07-23 DIAGNOSIS — M25562 Pain in left knee: Secondary | ICD-10-CM | POA: Insufficient documentation

## 2021-07-24 ENCOUNTER — Ambulatory Visit: Payer: BC Managed Care – PPO | Admitting: Orthopedic Surgery

## 2021-08-06 ENCOUNTER — Ambulatory Visit (HOSPITAL_COMMUNITY): Payer: BC Managed Care – PPO | Admitting: Physical Therapy

## 2021-08-06 ENCOUNTER — Other Ambulatory Visit: Payer: Self-pay

## 2021-08-06 ENCOUNTER — Encounter (HOSPITAL_COMMUNITY): Payer: Self-pay | Admitting: Physical Therapy

## 2021-08-06 DIAGNOSIS — M6281 Muscle weakness (generalized): Secondary | ICD-10-CM | POA: Diagnosis present

## 2021-08-06 DIAGNOSIS — M25561 Pain in right knee: Secondary | ICD-10-CM | POA: Diagnosis present

## 2021-08-06 DIAGNOSIS — M25562 Pain in left knee: Secondary | ICD-10-CM | POA: Diagnosis present

## 2021-08-06 DIAGNOSIS — R2689 Other abnormalities of gait and mobility: Secondary | ICD-10-CM

## 2021-08-06 DIAGNOSIS — M545 Low back pain, unspecified: Secondary | ICD-10-CM | POA: Diagnosis not present

## 2021-08-06 NOTE — Therapy (Signed)
[place], [place], [address] Phone: [phone]   Fax:  [phone]  Physical Therapy Evaluation  Patient Details  Name: Rebecca Odom MRN: [id number] Date of Birth: 04-09-1971 Referring Provider (PT): Arther Abbott MD   Encounter Date: 08/06/2021   PT End of Session - 08/06/21 1523     Visit Number 1    Number of Visits 12    Date for PT Re-Evaluation 09/17/21    Authorization Type [employer] state health (no auth)    Progress Note Due on Visit 10    PT Start Time 1448    PT Stop Time 1521    PT Time Calculation (min) 33 min    Activity Tolerance Patient tolerated treatment well    Behavior During Therapy [facility] for tasks assessed/performed             Past Medical History:  Diagnosis Date   Asthma    as child   Bulging lumbar disc    Chronic abdominal pain    Chronic back pain    Chronic diarrhea    Chronic knee pain    bilateral   Chronic nausea    Diverticulosis    Headache    History of kidney stones    Hypertension    IBS (irritable bowel syndrome)    Sciatica     Past Surgical History:  Procedure Laterality Date   CHOLECYSTECTOMY  2004   [facility]-Dr. Arnoldo Morale   COLONOSCOPY N/A 03/06/2013   Procedure: COLONOSCOPY;  Surgeon: Rogene Houston, MD;  [facility];  Service: Endoscopy;  Laterality: N/A;  1030   ENDOMETRIAL ABLATION  June 2012   KNEE ARTHROSCOPY Left 05/12/2017   Procedure: ARTHROSCOPY KNEE removal loose body;  Surgeon: Carole Civil, MD;  Location: [facility];  Service: Orthopedics;  Laterality: Left;   KNEE ARTHROTOMY Left 05/12/2017   Procedure: KNEE ARTHROTOMY;  Surgeon: Carole Civil, MD;  Location: [facility];  Service: Orthopedics;  Laterality: Left;    There were no vitals filed for this visit.    Subjective Assessment - 08/06/21 1449     Subjective Patient is a 50 y.o. female who presents to physical therapy with c/o LBP. Patient states LBP and bilateral  knee pain. Her L knee was bothering her more but now right one is bothering her too. She is going Tuesday to have a nerve study done on R side. Symptoms worsen with standing. Symptoms ease with alieve and cream. Her MD thought it might help so she is here for PT evaluation. Her goal is to be more mobile.    Limitations Standing;Walking    Patient Stated Goals more mobile    Currently in Pain? Yes    Pain Score 8     Pain Location Knee    Pain Orientation Right;Left    Pain Descriptors / Indicators Aching    Pain Type Chronic pain    Pain Onset More than a month ago    Pain Frequency Constant                [facility] PT Assessment - 08/06/21 0001       Assessment   Medical Diagnosis LBP    Referring Provider (PT) Arther Abbott MD    Onset Date/Surgical Date 08/07/19    Next MD Visit need to schedule    Prior Therapy yes      Precautions   Precautions None      Restrictions  Weight Bearing Restrictions No      Balance Screen   Has the patient fallen in the past 6 months No    Has the patient had a decrease in activity level because of a fear of falling?  No    Is the patient reluctant to leave their home because of a fear of falling?  No      Prior Function   Level of Independence Independent    Vocation Full time employment    Transport planner      Cognition   Overall Cognitive Status Within Functional Limits for tasks assessed      Observation/Other Assessments   Observations Ambulates without AD, unsteady cadence    Focus on Therapeutic Outcomes (FOTO)  45% function      Sensation   Light Touch Impaired Detail    Additional Comments increased R L4, decreased R L5      ROM / Strength   AROM / PROM / Strength AROM;Strength      AROM   Overall AROM Comments pain and tightness in low back with all AROM    AROM Assessment Site Lumbar    Lumbar Flexion 0% limited    Lumbar Extension 50% limited    Lumbar - Right Side Bend 50% limited     Lumbar - Left Side Bend 50% limited    Lumbar - Right Rotation 25% limited    Lumbar - Left Rotation 25% limited      Strength   Overall Strength Comments knee pain with all testing    Strength Assessment Site Hip;Knee;Ankle    Right/Left Hip Right;Left    Right Hip Flexion 4-/5    Left Hip Flexion 4-/5    Right/Left Knee Right;Left    Right Knee Flexion 5/5    Right Knee Extension 4/5    Left Knee Flexion 5/5    Left Knee Extension 4+/5    Right/Left Ankle Right;Left    Right Ankle Dorsiflexion 4+/5    Left Ankle Dorsiflexion 4+/5      Transfers   Comments labored      Ambulation/Gait   Ambulation/Gait Yes    Ambulation/Gait Assistance 6: Modified independent (Device/Increase time)    Ambulation Distance (Feet) 300 Feet    Assistive device None    Gait Pattern Antalgic    Gait Comments 2MWT, increased lateral trunk excursion, antalgic bilaterally L>R                        Objective measurements completed on examination: See above findings.       [facility] Adult PT Treatment/Exercise - 08/06/21 0001       Exercises   Exercises Knee/Hip      Knee/Hip Exercises: Seated   Long Arc Quad Both;1 set;10 reps    Long Arc Quad Limitations 10 second holds                     PT Education - 08/06/21 1449     Education Details Patient educated on exam findings, POC, scope of PT, HEP    Person(s) Educated Patient    Methods Explanation;Demonstration;Handout    Comprehension Verbalized understanding;Returned demonstration              PT Short Term Goals - 08/06/21 1530       PT SHORT TERM GOAL #1   Title Patient will be independent with HEP in order to improve functional outcomes.  Time 3    Period Weeks    Status New    Target Date 08/27/21      PT SHORT TERM GOAL #2   Title Patient will report at least 25% improvement in symptoms for improved quality of life.    Time 3    Period Weeks    Status New    Target Date 08/27/21                PT Long Term Goals - 08/06/21 1531       PT LONG TERM GOAL #1   Title Patient will report at least 75% improvement in symptoms for improved quality of life.    Time 6    Period Weeks    Status New    Target Date 09/17/21      PT LONG TERM GOAL #2   Title Patient will be able to ambulate at least  375 feet in 2MWT in order to demonstrate improved gait speed for community ambulation.    Time 6    Period Weeks    Status New    Target Date 09/17/21      PT LONG TERM GOAL #3   Title Patient will improve FOTO score by at least 15 points in order to indicate improved tolerance to activity.    Time 6    Period Weeks    Status New    Target Date 09/17/21                    Plan - 08/06/21 1524     Clinical Impression Statement Patient is a 50 y.o. female who presents to physical therapy with c/o LBP and bilateral knee pain. She presents with pain limited deficits in lumbar spine and bilateral LE strength, ROM, endurance, gait, balance, and functional mobility with ADL. She is having to modify and restrict ADL as indicated by FOTO score as well as subjective information and objective measures which is affecting overall participation. Patient will benefit from skilled physical therapy in order to improve function and reduce impairment.    Personal Factors and Comorbidities Comorbidity 3+;Fitness;Time since onset of injury/illness/exacerbation    Comorbidities Arthritis, Back pain, BMI over 30, Gastrointestinal Disease, High Blood  Pressure, Prior knee Surgery    Examination-Activity Limitations Locomotion Level;Transfers;Bend;Lift;Stairs;Stand;Squat    Examination-Participation Restrictions Meal Prep;Occupation;Community Activity;Shop;Volunteer;Dorita Sciara    Stability/Clinical Decision Making Evolving/Moderate complexity    Clinical Decision Making Moderate    Rehab Potential Fair    PT Frequency Other (comment)   1-2x/week for 6 weeks   PT Duration 6 weeks     PT Treatment/Interventions ADLs/Self Care Home Management;Aquatic Therapy;Electrical Stimulation;Cryotherapy;Iontophoresis 4mg /ml Dexamethasone;Moist Heat;Traction;Ultrasound;DME Instruction;Gait training;Stair training;Functional mobility training;Therapeutic activities;Therapeutic exercise;Balance training;Neuromuscular re-education;Patient/family education;Orthotic Fit/Training;Manual techniques;Compression bandaging;Scar mobilization;Dry needling;Energy conservation;Splinting;Taping;Vasopneumatic Device;Spinal Manipulations;Joint Manipulations    PT Next Visit Plan test hip MMT, core strength, likely hip strength, quad strength, balance training    PT Home Exercise Plan 9/21 LAQ    Consulted and Agree with Plan of Care Patient             Patient will benefit from skilled therapeutic intervention in order to improve the following deficits and impairments:  Abnormal gait, Difficulty walking, Decreased range of motion, Decreased endurance, Decreased activity tolerance, Decreased balance, Decreased mobility, Decreased strength, Improper body mechanics, Pain  Visit Diagnosis: Low back pain, unspecified back pain laterality, unspecified chronicity, unspecified whether sciatica present  Pain in both knees, unspecified chronicity  Muscle weakness (generalized)  Other abnormalities  of gait and mobility     Problem List Patient Active Problem List   Diagnosis Date Noted   Benign essential hypertension 05/15/2021   Bilateral primary osteoarthritis of knee 05/15/2021   Sinusitis 05/15/2021   Loose, body, joint, knee, left    IBS (irritable bowel syndrome) 06/12/2013   Obesity 06/12/2013   Diverticulitis of colon (without mention of hemorrhage)(562.11) 01/26/2013   Abdominal pain, other specified site 01/12/2013   Disc prolapse 04/20/2012   Sciatica 01/25/2012    3:34 PM, 08/06/21 Mearl Latin PT, DPT Physical Therapist at [facility] [place], [place], [address] Phone: [phone]   Fax:  [phone]  Name: TERRIANNA HOLSCLAW MRN: [id number] Date of Birth: April 30, 1971

## 2021-08-06 NOTE — Patient Instructions (Signed)
Access Code: [email or web address] URL: [email or web address] Date: 08/06/2021 Prepared by: [facility] Chaze Hruska  Exercises Seated Long Arc Quad - 2 x daily - 7 x weekly - 1 sets - 10 reps - 10 second hold

## 2021-08-12 ENCOUNTER — Other Ambulatory Visit: Payer: Self-pay

## 2021-08-12 ENCOUNTER — Ambulatory Visit (INDEPENDENT_AMBULATORY_CARE_PROVIDER_SITE_OTHER): Payer: BC Managed Care – PPO | Admitting: Physical Medicine and Rehabilitation

## 2021-08-12 ENCOUNTER — Encounter: Payer: Self-pay | Admitting: Physical Medicine and Rehabilitation

## 2021-08-12 DIAGNOSIS — R202 Paresthesia of skin: Secondary | ICD-10-CM

## 2021-08-12 NOTE — Progress Notes (Signed)
Right hand numbness. Worse at night. Pain and swelling in hand. Worse when wearing brace.  Right hand dominant +lotion

## 2021-08-14 ENCOUNTER — Ambulatory Visit (HOSPITAL_COMMUNITY): Payer: BC Managed Care – PPO | Admitting: Physical Therapy

## 2021-08-15 NOTE — Progress Notes (Signed)
[name] - 50 y.o. female MRN [id number]  Date of birth: 21-Apr-1971  Office Visit Note: Visit Date: 08/12/2021 PCP: Denyce Robert, FNP Referred by: Denyce Robert, FNP  Subjective: Chief Complaint  Patient presents with   Right Hand - Numbness, Pain   HPI:  Rebecca Odom is a 50 y.o. female who comes in today at the request of Dr. Arther Abbott for electrodiagnostic study of the Right upper extremities.  Patient is Right hand dominant.  She reports chronic worsening severe somewhat global right hand numbness in all of the fingers.  She does get nocturnal complaints with a positive flick sign.  She reports some swelling with the hand.  She has tried bracing but feels like it is worse when she uses the bracing.  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  She denies any frank radicular symptoms.  She denies any left-sided complaints.  She has no prior electrodiagnostic studies.   ROS Otherwise per HPI.  Assessment & Plan: Visit Diagnoses:    ICD-10-CM   1. 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   A moderate right median nerve entrapment at the wrist (carpal tunnel syndrome) affecting sensory and motor components.   There is no significant electrodiagnostic evidence of any other focal nerve entrapment, brachial plexopathy or cervical radiculopathy.  Recommendations: 1.  Follow-up with referring physician. 2.  Continue current management of symptoms. 3.  Suggest surgical evaluation.  Meds & Orders: No orders of the defined types were placed in this encounter.   Orders Placed This Encounter  Procedures   NCV with EMG (electromyography)    Follow-up: Return for Arther Abbott, MD.    Procedures: No procedures performed  EMG & NCV Findings: Evaluation of the right median motor nerve showed prolonged distal onset latency (4.5 ms) and decreased conduction velocity (Elbow-Wrist, 49 m/s).  The right ulnar motor nerve showed decreased conduction velocity (B Elbow-Wrist, 51 m/s) and decreased conduction velocity (A Elbow-B Elbow, 36 m/s).  The right median (across palm) sensory nerve showed prolonged distal peak latency (Wrist, 4.5 ms) and prolonged distal peak latency (Palm, 2.3 ms).  The right ulnar sensory nerve showed prolonged distal peak latency (4.3 ms) and decreased conduction velocity (Wrist-5th Digit, 33 m/s).  All remaining nerves (as indicated in the following tables) were within normal limits.    All examined muscles (as indicated in the following table) showed no evidence of electrical instability.    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   A moderate right median nerve entrapment at the wrist (carpal tunnel syndrome) affecting sensory and motor components.   There is no significant electrodiagnostic evidence of any other focal nerve entrapment, brachial plexopathy or cervical radiculopathy.  Recommendations: 1.  Follow-up with referring physician. 2.  Continue current management of symptoms. 3.  Suggest surgical evaluation.  ___________________________ Laurence Spates FAAPMR Board Certified, American Board of Physical Medicine and Rehabilitation    Nerve Conduction Studies Anti Sensory Summary Table   Stim Site NR Peak (ms) Norm Peak (ms) P-T Amp (V) Norm P-T Amp Site1 Site2 Delta-P (ms) Dist (cm) Vel (m/s) Norm Vel (  m/s)  Right Median Acr Palm Anti Sensory (2nd Digit)  28.5C  Wrist    *4.5 <3.6 45.0 >10 Wrist Palm 2.2 0.0    Palm    *2.3 <2.0 53.5         Right Radial Anti Sensory (Base  1st Digit)  28.1C  Wrist    2.4 <3.1 32.1  Wrist Base 1st Digit 2.4 0.0    Right Ulnar Anti Sensory (5th Digit)  28.4C  Wrist    *4.3 <3.7 18.6 >15.0 Wrist 5th Digit 4.3 14.0 *33 >38  B Elbow    4.3  14.2  B Elbow Wrist 0.0 0.0  >47   Motor Summary Table   Stim Site NR Onset (ms) Norm Onset (ms) O-P Amp (mV) Norm O-P Amp Site1 Site2 Delta-0 (ms) Dist (cm) Vel (m/s) Norm Vel (m/s)  Right Median Motor (Abd Poll Brev)  28C  Wrist    *4.5 <4.2 7.9 >5 Elbow Wrist 3.9 19.0 *49 >50  Elbow    8.4  7.4         Right Ulnar Motor (Abd Dig Min)  28C  Wrist    3.7 <4.2 6.6 >3 B Elbow Wrist 3.7 19.0 *51 >53  B Elbow    7.4  8.0  A Elbow B Elbow 2.8 10.0 *36 >53  A Elbow    10.2  7.3          EMG   Side Muscle Nerve Root Ins Act Fibs Psw Amp Dur Poly Recrt Int Fraser Din Comment  Right Abd Poll Brev Median C8-T1 Nml Nml Nml Nml Nml 0 Nml Nml   Right 1stDorInt Ulnar C8-T1 Nml Nml Nml Nml Nml 0 Nml Nml   Right PronatorTeres Median C6-7 Nml Nml Nml Nml Nml 0 Nml Nml   Right Biceps Musculocut C5-6 Nml Nml Nml Nml Nml 0 Nml Nml   Right Deltoid Axillary C5-6 Nml Nml Nml Nml Nml 0 Nml Nml     Nerve Conduction Studies Anti Sensory Left/Right Comparison   Stim Site L Lat (ms) R Lat (ms) L-R Lat (ms) L Amp (V) R Amp (V) L-R Amp (%) Site1 Site2 L Vel (m/s) R Vel (m/s) L-R Vel (m/s)  Median Acr Palm Anti Sensory (2nd Digit)  28.5C  Wrist  *4.5   45.0  Wrist Palm     Palm  *2.3   53.5        Radial Anti Sensory (Base 1st Digit)  28.1C  Wrist  2.4   32.1  Wrist Base 1st Digit     Ulnar Anti Sensory (5th Digit)  28.4C  Wrist  *4.3   18.6  Wrist 5th Digit  *33   B Elbow  4.3   14.2  B Elbow Wrist      Motor Left/Right Comparison   Stim Site L Lat (ms) R Lat (ms) L-R Lat (ms) L Amp (mV) R Amp (mV) L-R Amp (%) Site1 Site2 L Vel (m/s) R Vel (m/s) L-R Vel (m/s)  Median Motor (Abd Poll Brev)  28C  Wrist  *4.5   7.9  Elbow Wrist  *49   Elbow  8.4   7.4        Ulnar Motor (Abd Dig Min)  28C  Wrist  3.7    6.6  B Elbow Wrist  *51   B Elbow  7.4   8.0  A Elbow B Elbow  *36   A Elbow  10.2   7.3  Waveforms:            Clinical History: No specialty comments available.     Objective:  VS:  HT:    WT:   BMI:     BP:   HR: bpm  TEMP: ( )  RESP:  Physical Exam Musculoskeletal:        General: No swelling, tenderness or deformity.     Comments: Inspection reveals no atrophy of the bilateral APB or FDI or hand intrinsics. There is no swelling, color changes, allodynia or dystrophic changes. There is 5 out of 5 strength in the bilateral wrist extension, finger abduction and long finger flexion. There is intact sensation to light touch in all dermatomal and peripheral nerve distributions. There is a equivocal Froment's test on the right.  There is a positive Phalen's test right. There is a negative Hoffmann's test bilaterally.  Skin:    General: Skin is warm and dry.     Findings: No erythema or rash.  Neurological:     General: No focal deficit present.     Mental Status: She is alert and oriented to person, place, and time.     Motor: No weakness or abnormal muscle tone.     Coordination: Coordination normal.  Psychiatric:        Mood and Affect: Mood normal.        Behavior: Behavior normal.     Imaging: No results found.

## 2021-08-15 NOTE — Procedures (Signed)
EMG & NCV Findings: Evaluation of the right median motor nerve showed prolonged distal onset latency (4.5 ms) and decreased conduction velocity (Elbow-Wrist, 49 m/s).  The right ulnar motor nerve showed decreased conduction velocity (B Elbow-Wrist, 51 m/s) and decreased conduction velocity (A Elbow-B Elbow, 36 m/s).  The right median (across palm) sensory nerve showed prolonged distal peak latency (Wrist, 4.5 ms) and prolonged distal peak latency (Palm, 2.3 ms).  The right ulnar sensory nerve showed prolonged distal peak latency (4.3 ms) and decreased conduction velocity (Wrist-5th Digit, 33 m/s).  All remaining nerves (as indicated in the following tables) were within normal limits.    All examined muscles (as indicated in the following table) showed no evidence of electrical instability.    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   A moderate right median nerve entrapment at the wrist (carpal tunnel syndrome) affecting sensory and motor components.   There is no significant electrodiagnostic evidence of any other focal nerve entrapment, brachial plexopathy or cervical radiculopathy.  Recommendations: 1.  Follow-up with referring physician. 2.  Continue current management of symptoms. 3.  Suggest surgical evaluation.  ___________________________ Laurence Spates FAAPMR Board Certified, American Board of Physical Medicine and Rehabilitation    Nerve Conduction Studies Anti Sensory Summary Table   Stim Site NR Peak (ms) Norm Peak (ms) P-T Amp (V) Norm P-T Amp Site1 Site2 Delta-P (ms) Dist (cm) Vel (m/s) Norm Vel (m/s)  Right Median Acr Palm Anti Sensory (2nd Digit)  28.5C  Wrist    *4.5 <3.6 45.0 >10 Wrist Palm 2.2 0.0    Palm    *2.3 <2.0 53.5         Right Radial Anti Sensory (Base 1st Digit)  28.1C  Wrist    2.4 <3.1 32.1   Wrist Base 1st Digit 2.4 0.0    Right Ulnar Anti Sensory (5th Digit)  28.4C  Wrist    *4.3 <3.7 18.6 >15.0 Wrist 5th Digit 4.3 14.0 *33 >38  B Elbow    4.3  14.2  B Elbow Wrist 0.0 0.0  >47   Motor Summary Table   Stim Site NR Onset (ms) Norm Onset (ms) O-P Amp (mV) Norm O-P Amp Site1 Site2 Delta-0 (ms) Dist (cm) Vel (m/s) Norm Vel (m/s)  Right Median Motor (Abd Poll Brev)  28C  Wrist    *4.5 <4.2 7.9 >5 Elbow Wrist 3.9 19.0 *49 >50  Elbow    8.4  7.4         Right Ulnar Motor (Abd Dig Min)  28C  Wrist    3.7 <4.2 6.6 >3 B Elbow Wrist 3.7 19.0 *51 >53  B Elbow    7.4  8.0  A Elbow B Elbow 2.8 10.0 *36 >53  A Elbow    10.2  7.3          EMG   Side Muscle Nerve Root Ins Act Fibs Psw Amp Dur Poly Recrt Int Fraser Din Comment  Right Abd Poll Brev Median C8-T1 Nml Nml Nml Nml Nml 0 Nml Nml   Right 1stDorInt Ulnar C8-T1 Nml Nml Nml Nml Nml 0 Nml Nml   Right PronatorTeres Median C6-7 Nml Nml Nml Nml Nml 0 Nml Nml   Right Biceps Musculocut C5-6 Nml Nml Nml Nml Nml 0 Nml Nml   Right Deltoid Axillary C5-6 Nml Nml Nml Nml Nml 0 Nml Nml  Nerve Conduction Studies Anti Sensory Left/Right Comparison   Stim Site L Lat (ms) R Lat (ms) L-R Lat (ms) L Amp (V) R Amp (V) L-R Amp (%) Site1 Site2 L Vel (m/s) R Vel (m/s) L-R Vel (m/s)  Median Acr Palm Anti Sensory (2nd Digit)  28.5C  Wrist  *4.5   45.0  Wrist Palm     Palm  *2.3   53.5        Radial Anti Sensory (Base 1st Digit)  28.1C  Wrist  2.4   32.1  Wrist Base 1st Digit     Ulnar Anti Sensory (5th Digit)  28.4C  Wrist  *4.3   18.6  Wrist 5th Digit  *33   B Elbow  4.3   14.2  B Elbow Wrist      Motor Left/Right Comparison   Stim Site L Lat (ms) R Lat (ms) L-R Lat (ms) L Amp (mV) R Amp (mV) L-R Amp (%) Site1 Site2 L Vel (m/s) R Vel (m/s) L-R Vel (m/s)  Median Motor (Abd Poll Brev)  28C  Wrist  *4.5   7.9  Elbow Wrist  *49   Elbow  8.4   7.4        Ulnar Motor (Abd Dig Min)  28C  Wrist  3.7   6.6  B Elbow Wrist  *51   B Elbow  7.4    8.0  A Elbow B Elbow  *36   A Elbow  10.2   7.3           Waveforms:

## 2021-08-21 ENCOUNTER — Telehealth (HOSPITAL_COMMUNITY): Payer: Self-pay | Admitting: Physical Therapy

## 2021-08-21 ENCOUNTER — Encounter (HOSPITAL_COMMUNITY): Payer: BC Managed Care – PPO | Admitting: Physical Therapy

## 2021-08-21 NOTE — Telephone Encounter (Signed)
Called PT re: no show #1  PT states she tried to call and left a message on the answering machine.  States that she  was at the MD as her BP went up.  She will be here next week.  Rayetta Humphrey, [place] CLT [phone]

## 2021-08-28 ENCOUNTER — Encounter (HOSPITAL_COMMUNITY): Payer: BC Managed Care – PPO | Admitting: Physical Therapy

## 2021-08-28 ENCOUNTER — Telehealth (HOSPITAL_COMMUNITY): Payer: Self-pay | Admitting: Physical Therapy

## 2021-08-28 NOTE — Telephone Encounter (Signed)
Pt did not show for appointment.  Upon further investigation, pt also missed an appointment on 9/29 making this her third NS.    Cancelled all future appoitnments except for next one on 10/20 and explained she would be discharged from the program per policy if she did not show for this appointment.   Teena Irani, PTA/CLT, [facility] [phone]

## 2021-09-04 ENCOUNTER — Encounter (HOSPITAL_COMMUNITY): Payer: BC Managed Care – PPO | Admitting: Physical Therapy

## 2021-09-04 ENCOUNTER — Telehealth (HOSPITAL_COMMUNITY): Payer: Self-pay | Admitting: Physical Therapy

## 2021-09-04 NOTE — Telephone Encounter (Signed)
NO show #1 Called pt; no answer left a message that she does not have any future appointments. If she desires to have therapy requested pt to call the office and make an appointment.   Rayetta Humphrey, [place] CLT [phone]

## 2021-09-08 ENCOUNTER — Encounter (HOSPITAL_COMMUNITY): Payer: BC Managed Care – PPO | Admitting: Physical Therapy

## 2021-09-17 ENCOUNTER — Encounter (HOSPITAL_COMMUNITY): Payer: BC Managed Care – PPO | Admitting: Physical Therapy

## 2024-02-07 ENCOUNTER — Other Ambulatory Visit (HOSPITAL_COMMUNITY): Payer: Self-pay | Admitting: Adult Health

## 2024-02-07 DIAGNOSIS — Z1231 Encounter for screening mammogram for malignant neoplasm of breast: Secondary | ICD-10-CM

## 2024-03-08 ENCOUNTER — Encounter (HOSPITAL_COMMUNITY): Payer: Self-pay

## 2024-03-08 ENCOUNTER — Ambulatory Visit (HOSPITAL_COMMUNITY): Payer: Self-pay

## 2024-05-10 ENCOUNTER — Ambulatory Visit (INDEPENDENT_AMBULATORY_CARE_PROVIDER_SITE_OTHER): Payer: BC Managed Care – PPO | Admitting: Otolaryngology

## 2024-07-27 ENCOUNTER — Ambulatory Visit
Admission: EM | Admit: 2024-07-27 | Discharge: 2024-07-27 | Disposition: A | Discharge status: Home / Self Care | Attending: Family Medicine | Admitting: Family Medicine

## 2024-07-27 DIAGNOSIS — M5441 Lumbago with sciatica, right side: Secondary | ICD-10-CM | POA: Diagnosis not present

## 2024-07-27 LAB — POCT URINE DIPSTICK
Bilirubin, UA: NEGATIVE
Blood, UA: NEGATIVE
Glucose, UA: NEGATIVE mg/dL
Ketones, POC UA: NEGATIVE mg/dL
Leukocytes, UA: NEGATIVE
Nitrite, UA: NEGATIVE
Spec Grav, UA: 1.025 (ref 1.010–1.025)
Urobilinogen, UA: 0.2 U/dL
pH, UA: 6 (ref 5.0–8.0)

## 2024-07-27 MED ORDER — DEXAMETHASONE SODIUM PHOSPHATE 10 MG/ML IJ SOLN
10.0000 mg | Freq: Once | INTRAMUSCULAR | Status: AC
  Administered 2024-07-27: 10 mg via INTRAMUSCULAR

## 2024-07-27 MED ORDER — KETOROLAC TROMETHAMINE 30 MG/ML IJ SOLN
30.0000 mg | Freq: Once | INTRAMUSCULAR | Status: AC
  Administered 2024-07-27: 30 mg via INTRAMUSCULAR

## 2024-07-27 MED ORDER — METHOCARBAMOL 500 MG PO TABS: 500.0000 mg | ORAL_TABLET | Freq: Two times a day (BID) | ORAL | 0 refills | Status: AC

## 2024-07-27 NOTE — ED Provider Notes (Signed)
 [facility]   [id number] 07/27/24 Arrival Time: 1508  ASSESSMENT & PLAN:  1. Acute right-sided low back pain with right-sided sciatica    U/A normal. Able to ambulate here and hemodynamically stable. No indication for imaging of back at this time given no trauma and normal neurological exam. Discussed.  Meds ordered this encounter  Medications   dexamethasone  (DECADRON ) injection 10 mg   ketorolac  (TORADOL ) 30 MG/ML injection 30 mg   methocarbamol  (ROBAXIN ) 500 MG tablet    Sig: Take 1 tablet (500 mg total) by mouth 2 (two) times daily.    Dispense:  20 tablet    Refill:  0   Work/school excuse note: provided. Medication sedation precautions given. Encourage ROM/movement as tolerated. May f/u here as needed.  Reviewed expectations re: course of current medical issues. Questions answered. Outlined signs and symptoms indicating need for more acute intervention. Patient verbalized understanding. After Visit Summary given.   SUBJECTIVE: History from: patient.  ISHANI GOLDWASSER is a 53 y.o. female who presents with complaint of low back pain; R sided; noted past 24-48 hours; ques related to lifting at work. Denies trauma. Normal urination/bowel habits. Advil  with mild help. Denies extremity sensation changes or weakness.  H/O sciatica.   OBJECTIVE:  Vitals:   07/27/24 1518  BP: (!) 154/74  Pulse: 99  Temp: 98.1 F (36.7 C)  TempSrc: Oral  SpO2: 96%    General appearance: alert; no distress HEENT: [place]; AT Neck: supple with FROM; without midline tenderness CV: regular Lungs: unlabored respirations; speaks full sentences without difficulty Abdomen: soft, non-tender; non-distended Back: no specific tenderness to palpation; FROM at waist; bruising: none; without midline tenderness Extremities: without edema; symmetrical without gross deformities; normal ROM of bilateral LE Skin: warm and dry Neurologic: normal gait; normal sensation and strength of  bilateral LE Psychological: alert and cooperative; normal mood and affect  Labs: Results for orders placed or performed during the hospital encounter of 07/27/24  POCT URINE DIPSTICK   Collection Time: 07/27/24  3:35 PM  Result Value Ref Range   Color, UA yellow yellow   Clarity, UA clear clear   Glucose, UA negative negative mg/dL   Bilirubin, UA negative negative   Ketones, POC UA negative negative mg/dL   Spec Grav, UA 8.974 8.989 - 1.025   Blood, UA negative negative   pH, UA 6.0 5.0 - 8.0   POC PROTEIN,UA trace negative, trace   Urobilinogen, UA 0.2 0.2 or 1.0 E.U./dL   Nitrite, UA Negative Negative   Leukocytes, UA Negative Negative   Labs Reviewed  POCT URINE DIPSTICK    Imaging: No results found.  Allergies  Allergen Reactions   Bee Venom Shortness Of Breath and Swelling    Was prescribed epi-pen   Vicodin [Hydrocodone -Acetaminophen ] Anaphylaxis    Past Medical History:  Diagnosis Date   Asthma    as child   Bulging lumbar disc    Chronic abdominal pain    Chronic back pain    Chronic diarrhea    Chronic knee pain    bilateral   Chronic nausea    Diverticulosis    Headache    History of kidney stones    Hypertension    IBS (irritable bowel syndrome)    Sciatica    Social History   Socioeconomic History   Marital status: Married    Spouse name: Not on file   Number of children: Not on file   Years of education: Not on file   Highest  education level: Not on file  Occupational History   Not on file  Tobacco Use   Smoking status: Never   Smokeless tobacco: Never  Vaping Use   Vaping status: Never Used  Substance and Sexual Activity   Alcohol use: No   Drug use: No   Sexual activity: Yes    Birth control/protection: Surgical  Other Topics Concern   Not on file  Social History Narrative   Not on file   Social Drivers of Health   Financial Resource Strain: Not on file  Food Insecurity: Not on file  Transportation Needs: Not on file   Physical Activity: Not on file  Stress: Not on file  Social Connections: Not on file  Intimate Partner Violence: Not on file   Family History  Problem Relation Age of Onset   Heart disease Mother        heart attack   Alzheimer's disease Mother    Cancer Father        prostate   Heart disease Sister        CHF   Diabetes Brother    Cancer Brother        prostate   Heart disease Brother    Stroke Maternal Grandmother    Heart disease Maternal Grandfather        massive heart attack   Colon cancer Neg Hx    Colon polyps Neg Hx    Past Surgical History:  Procedure Laterality Date   CHOLECYSTECTOMY  2004   [facility]-Dr. Mavis   COLONOSCOPY N/A 03/06/2013   Procedure: COLONOSCOPY;  Surgeon: Claudis RAYMOND Rivet, MD;  Location: [facility];  Service: Endoscopy;  Laterality: N/A;  1030   ENDOMETRIAL ABLATION  June 2012   KNEE ARTHROSCOPY Left 05/12/2017   Procedure: ARTHROSCOPY KNEE removal loose body;  Surgeon: Margrette Taft BRAVO, MD;  Location: [facility];  Service: Orthopedics;  Laterality: Left;   KNEE ARTHROTOMY Left 05/12/2017   Procedure: KNEE ARTHROTOMY;  Surgeon: Margrette Taft BRAVO, MD;  Location: [facility];  Service: Orthopedics;  Laterality: Left;      Rolinda Rogue, MD 07/27/24 1605

## 2024-07-27 NOTE — Discharge Instructions (Signed)
 Meds ordered this encounter  Medications   dexamethasone  (DECADRON ) injection 10 mg   ketorolac  (TORADOL ) 30 MG/ML injection 30 mg   methocarbamol  (ROBAXIN ) 500 MG tablet    Sig: Take 1 tablet (500 mg total) by mouth 2 (two) times daily.    Dispense:  20 tablet    Refill:  0

## 2024-07-27 NOTE — ED Triage Notes (Signed)
 Patient presents to the office for right side lower back pain Patient is taking Advil .

## 2024-08-03 ENCOUNTER — Other Ambulatory Visit (HOSPITAL_COMMUNITY): Payer: Self-pay | Admitting: Adult Health

## 2024-08-03 DIAGNOSIS — M5431 Sciatica, right side: Secondary | ICD-10-CM
